# Patient Record
Sex: Female | Born: 1935
Health system: Southern US, Community
[De-identification: ages and names within clinical notes are randomized; demographics above are authoritative.]

## PROBLEM LIST (undated history)

## (undated) DIAGNOSIS — S92909A Unspecified fracture of unspecified foot, initial encounter for closed fracture: Secondary | ICD-10-CM

## (undated) DIAGNOSIS — N183 Chronic kidney disease, stage 3 unspecified: Secondary | ICD-10-CM

## (undated) DIAGNOSIS — C801 Malignant (primary) neoplasm, unspecified: Secondary | ICD-10-CM

## (undated) DIAGNOSIS — E079 Disorder of thyroid, unspecified: Secondary | ICD-10-CM

## (undated) DIAGNOSIS — T7840XA Allergy, unspecified, initial encounter: Secondary | ICD-10-CM

## (undated) DIAGNOSIS — E785 Hyperlipidemia, unspecified: Secondary | ICD-10-CM

## (undated) DIAGNOSIS — M1990 Unspecified osteoarthritis, unspecified site: Secondary | ICD-10-CM

## (undated) DIAGNOSIS — E669 Obesity, unspecified: Secondary | ICD-10-CM

## (undated) DIAGNOSIS — I10 Essential (primary) hypertension: Secondary | ICD-10-CM

## (undated) HISTORY — PX: TOTAL ABDOMINAL HYSTERECTOMY W/ BILATERAL SALPINGOOPHORECTOMY: SHX83

## (undated) HISTORY — DX: Obesity, unspecified: E66.9

## (undated) HISTORY — DX: Malignant (primary) neoplasm, unspecified: C80.1

## (undated) HISTORY — DX: Essential (primary) hypertension: I10

## (undated) HISTORY — DX: Disorder of thyroid, unspecified: E07.9

## (undated) HISTORY — DX: Chronic kidney disease, stage 3 (moderate): N18.3

## (undated) HISTORY — PX: CHOLECYSTECTOMY: SHX55

## (undated) HISTORY — DX: Hyperlipidemia, unspecified: E78.5

## (undated) HISTORY — PX: BREAST BIOPSY: SHX20

## (undated) HISTORY — DX: Allergy, unspecified, initial encounter: T78.40XA

## (undated) HISTORY — DX: Unspecified osteoarthritis, unspecified site: M19.90

## (undated) HISTORY — PX: CATARACT EXTRACTION: SUR2

## (undated) HISTORY — DX: Unspecified fracture of unspecified foot, initial encounter for closed fracture: S92.909A

## (undated) HISTORY — DX: Chronic kidney disease, stage 3 unspecified: N18.30

## (undated) HISTORY — PX: TOTAL KNEE ARTHROPLASTY: SHX125

---

## 1999-07-17 ENCOUNTER — Other Ambulatory Visit: Admission: RE | Admit: 1999-07-17 | Discharge: 1999-07-17 | Payer: Self-pay | Admitting: Family Medicine

## 2000-04-05 ENCOUNTER — Encounter: Payer: Self-pay | Admitting: Family Medicine

## 2000-04-05 ENCOUNTER — Encounter: Admission: RE | Admit: 2000-04-05 | Discharge: 2000-04-05 | Payer: Self-pay | Admitting: Family Medicine

## 2000-05-25 ENCOUNTER — Encounter: Payer: Self-pay | Admitting: Surgery

## 2000-05-31 ENCOUNTER — Encounter (INDEPENDENT_AMBULATORY_CARE_PROVIDER_SITE_OTHER): Payer: Self-pay | Admitting: Specialist

## 2000-06-01 ENCOUNTER — Inpatient Hospital Stay (HOSPITAL_COMMUNITY): Admission: EM | Admit: 2000-06-01 | Discharge: 2000-06-02 | Payer: Self-pay | Admitting: Surgery

## 2000-08-11 ENCOUNTER — Other Ambulatory Visit: Admission: RE | Admit: 2000-08-11 | Discharge: 2000-08-11 | Payer: Self-pay | Admitting: Family Medicine

## 2001-02-28 ENCOUNTER — Encounter: Payer: Self-pay | Admitting: Family Medicine

## 2001-02-28 ENCOUNTER — Encounter: Admission: RE | Admit: 2001-02-28 | Discharge: 2001-02-28 | Payer: Self-pay | Admitting: Family Medicine

## 2001-03-03 ENCOUNTER — Encounter: Payer: Self-pay | Admitting: Family Medicine

## 2001-03-03 ENCOUNTER — Encounter: Admission: RE | Admit: 2001-03-03 | Discharge: 2001-03-03 | Payer: Self-pay | Admitting: Family Medicine

## 2001-08-03 ENCOUNTER — Other Ambulatory Visit: Admission: RE | Admit: 2001-08-03 | Discharge: 2001-08-03 | Payer: Self-pay | Admitting: Family Medicine

## 2002-07-27 ENCOUNTER — Other Ambulatory Visit: Admission: RE | Admit: 2002-07-27 | Discharge: 2002-07-27 | Payer: Self-pay | Admitting: Family Medicine

## 2003-07-30 ENCOUNTER — Other Ambulatory Visit: Admission: RE | Admit: 2003-07-30 | Discharge: 2003-07-30 | Payer: Self-pay | Admitting: Family Medicine

## 2004-09-26 ENCOUNTER — Ambulatory Visit: Payer: Self-pay | Admitting: Family Medicine

## 2004-11-04 ENCOUNTER — Ambulatory Visit: Payer: Self-pay | Admitting: Family Medicine

## 2004-11-04 ENCOUNTER — Other Ambulatory Visit: Admission: RE | Admit: 2004-11-04 | Discharge: 2004-11-04 | Payer: Self-pay | Admitting: Family Medicine

## 2005-02-20 ENCOUNTER — Ambulatory Visit: Payer: Self-pay | Admitting: Family Medicine

## 2005-03-05 ENCOUNTER — Ambulatory Visit: Payer: Self-pay | Admitting: Family Medicine

## 2005-05-13 ENCOUNTER — Ambulatory Visit: Payer: Self-pay | Admitting: Family Medicine

## 2005-07-06 ENCOUNTER — Encounter: Payer: Self-pay | Admitting: Family Medicine

## 2005-07-06 LAB — CONVERTED CEMR LAB

## 2005-11-18 ENCOUNTER — Ambulatory Visit: Payer: Self-pay | Admitting: Family Medicine

## 2005-11-24 ENCOUNTER — Encounter: Payer: Self-pay | Admitting: Family Medicine

## 2005-11-24 ENCOUNTER — Other Ambulatory Visit: Admission: RE | Admit: 2005-11-24 | Discharge: 2005-11-24 | Payer: Self-pay | Admitting: Family Medicine

## 2005-11-24 ENCOUNTER — Ambulatory Visit: Payer: Self-pay | Admitting: Family Medicine

## 2005-12-10 ENCOUNTER — Ambulatory Visit: Payer: Self-pay | Admitting: Family Medicine

## 2006-01-25 ENCOUNTER — Encounter: Admission: RE | Admit: 2006-01-25 | Discharge: 2006-01-25 | Payer: Self-pay | Admitting: Nephrology

## 2006-01-27 ENCOUNTER — Emergency Department (HOSPITAL_COMMUNITY): Admission: EM | Admit: 2006-01-27 | Discharge: 2006-01-27 | Payer: Self-pay | Admitting: Emergency Medicine

## 2006-03-01 ENCOUNTER — Ambulatory Visit: Payer: Self-pay

## 2006-08-19 ENCOUNTER — Ambulatory Visit: Payer: Self-pay | Admitting: Family Medicine

## 2006-11-25 ENCOUNTER — Ambulatory Visit: Payer: Self-pay | Admitting: Family Medicine

## 2006-11-25 LAB — CONVERTED CEMR LAB
AST: 23 units/L (ref 0–37)
Alkaline Phosphatase: 70 units/L (ref 39–117)
BUN: 40 mg/dL — ABNORMAL HIGH (ref 6–23)
Basophils Relative: 0.1 % (ref 0.0–1.0)
CO2: 28 meq/L (ref 19–32)
Calcium: 9.9 mg/dL (ref 8.4–10.5)
Chloride: 108 meq/L (ref 96–112)
Cholesterol: 214 mg/dL (ref 0–200)
Creatinine, Ser: 1.6 mg/dL — ABNORMAL HIGH (ref 0.4–1.2)
Creatinine,U: 145.8 mg/dL
Eosinophils Relative: 4.6 % (ref 0.0–5.0)
HCT: 39.2 % (ref 36.0–46.0)
HDL: 59.8 mg/dL (ref >39.0)
Hemoglobin: 13.4 g/dL (ref 12.0–15.0)
Lymphocytes Relative: 41.4 % (ref 12.0–46.0)
MCHC: 34.3 g/dL (ref 30.0–36.0)
Microalb, Ur: 0.7 mg/dL (ref 0.0–1.9)
Monocytes Absolute: 0.7 10*3/uL (ref 0.2–0.7)
Monocytes Relative: 10.4 % (ref 3.0–11.0)
Platelets: 246 10*3/uL (ref 150–400)
RBC: 4.21 M/uL (ref 3.87–5.11)
Sodium: 143 meq/L (ref 135–145)
Total CHOL/HDL Ratio: 3.6
Triglycerides: 264 mg/dL (ref 0–149)
VLDL: 53 mg/dL — ABNORMAL HIGH (ref 0–40)
WBC: 7.1 10*3/uL (ref 4.5–10.5)

## 2006-12-02 ENCOUNTER — Encounter: Payer: Self-pay | Admitting: Family Medicine

## 2006-12-02 ENCOUNTER — Ambulatory Visit: Payer: Self-pay | Admitting: Family Medicine

## 2006-12-02 ENCOUNTER — Other Ambulatory Visit: Admission: RE | Admit: 2006-12-02 | Discharge: 2006-12-02 | Payer: Self-pay | Admitting: Family Medicine

## 2006-12-02 DIAGNOSIS — I10 Essential (primary) hypertension: Secondary | ICD-10-CM

## 2006-12-02 DIAGNOSIS — J309 Allergic rhinitis, unspecified: Secondary | ICD-10-CM | POA: Insufficient documentation

## 2006-12-02 DIAGNOSIS — M1990 Unspecified osteoarthritis, unspecified site: Secondary | ICD-10-CM | POA: Insufficient documentation

## 2007-01-24 ENCOUNTER — Ambulatory Visit: Payer: Self-pay | Admitting: Family Medicine

## 2007-01-24 ENCOUNTER — Encounter: Payer: Self-pay | Admitting: Internal Medicine

## 2007-06-10 ENCOUNTER — Ambulatory Visit: Payer: Self-pay | Admitting: Family Medicine

## 2007-06-14 ENCOUNTER — Ambulatory Visit: Payer: Self-pay | Admitting: Family Medicine

## 2007-06-17 DIAGNOSIS — I781 Nevus, non-neoplastic: Secondary | ICD-10-CM

## 2007-07-20 ENCOUNTER — Inpatient Hospital Stay (HOSPITAL_COMMUNITY): Admission: RE | Admit: 2007-07-20 | Discharge: 2007-07-24 | Payer: Self-pay | Admitting: Orthopedic Surgery

## 2008-01-18 ENCOUNTER — Encounter: Payer: Self-pay | Admitting: Family Medicine

## 2008-01-19 ENCOUNTER — Other Ambulatory Visit: Admission: RE | Admit: 2008-01-19 | Discharge: 2008-01-19 | Payer: Self-pay | Admitting: Family Medicine

## 2008-01-19 ENCOUNTER — Ambulatory Visit: Payer: Self-pay | Admitting: Family Medicine

## 2008-01-19 ENCOUNTER — Encounter: Payer: Self-pay | Admitting: Family Medicine

## 2008-01-19 DIAGNOSIS — E785 Hyperlipidemia, unspecified: Secondary | ICD-10-CM | POA: Insufficient documentation

## 2008-01-19 DIAGNOSIS — M109 Gout, unspecified: Secondary | ICD-10-CM

## 2008-01-23 LAB — CONVERTED CEMR LAB
ALT: 14 units/L (ref 0–35)
Albumin: 4.6 g/dL (ref 3.5–5.2)
Alkaline Phosphatase: 80 units/L (ref 39–117)
BUN: 64 mg/dL — ABNORMAL HIGH (ref 6–23)
Basophils Relative: 0.2 % (ref 0.0–3.0)
Bilirubin, Direct: 0.1 mg/dL (ref 0.0–0.3)
Chloride: 102 meq/L (ref 96–112)
Creatinine, Ser: 1.8 mg/dL — ABNORMAL HIGH (ref 0.4–1.2)
GFR calc Af Amer: 36 mL/min
GFR calc non Af Amer: 29 mL/min
Glucose, Bld: 124 mg/dL — ABNORMAL HIGH (ref 70–99)
HDL: 53.3 mg/dL (ref >39.0)
MCV: 92.7 fL (ref 78.0–100.0)
Monocytes Absolute: 0.4 10*3/uL (ref 0.1–1.0)
Neutro Abs: 6.1 10*3/uL (ref 1.4–7.7)
Neutrophils Relative %: 73.6 % (ref 43.0–77.0)
Platelets: 243 10*3/uL (ref 150–400)
Potassium: 4.1 meq/L (ref 3.5–5.1)
Sodium: 137 meq/L (ref 135–145)
Total Bilirubin: 0.7 mg/dL (ref 0.3–1.2)
Total CHOL/HDL Ratio: 3.1
Total Protein: 7.3 g/dL (ref 6.0–8.3)
VLDL: 33 mg/dL (ref 0–40)

## 2008-12-26 ENCOUNTER — Telehealth: Payer: Self-pay | Admitting: Family Medicine

## 2009-02-08 ENCOUNTER — Ambulatory Visit: Payer: Self-pay | Admitting: Family Medicine

## 2009-02-08 DIAGNOSIS — E039 Hypothyroidism, unspecified: Secondary | ICD-10-CM

## 2009-02-08 LAB — CONVERTED CEMR LAB
Blood in Urine, dipstick: NEGATIVE
Glucose, Urine, Semiquant: NEGATIVE
Specific Gravity, Urine: 1.02
Urobilinogen, UA: 0.2
WBC Urine, dipstick: NEGATIVE

## 2009-02-14 LAB — CONVERTED CEMR LAB
Alkaline Phosphatase: 85 units/L (ref 39–117)
Basophils Absolute: 0 10*3/uL (ref 0.0–0.1)
Bilirubin, Direct: 0 mg/dL (ref 0.0–0.3)
CO2: 25 meq/L (ref 19–32)
Chloride: 110 meq/L (ref 96–112)
Cholesterol: 186 mg/dL (ref 0–200)
Eosinophils Absolute: 0.2 10*3/uL (ref 0.0–0.7)
Eosinophils Relative: 2.5 % (ref 0.0–5.0)
Hemoglobin: 13.3 g/dL (ref 12.0–15.0)
Lymphocytes Relative: 21.3 % (ref 12.0–46.0)
Lymphs Abs: 1.6 10*3/uL (ref 0.7–4.0)
Monocytes Absolute: 0.5 10*3/uL (ref 0.1–1.0)
Monocytes Relative: 6.5 % (ref 3.0–12.0)
Neutro Abs: 5.4 10*3/uL (ref 1.4–7.7)
Neutrophils Relative %: 69.6 % (ref 43.0–77.0)
RBC: 4.19 M/uL (ref 3.87–5.11)
TSH: 2.43 microintl units/mL (ref 0.35–5.50)
WBC: 7.7 10*3/uL (ref 4.5–10.5)

## 2010-01-01 ENCOUNTER — Encounter: Admission: RE | Admit: 2010-01-01 | Discharge: 2010-01-01 | Payer: Self-pay | Admitting: Family Medicine

## 2010-01-09 ENCOUNTER — Encounter: Payer: Self-pay | Admitting: Family Medicine

## 2010-01-22 ENCOUNTER — Encounter: Admission: RE | Admit: 2010-01-22 | Discharge: 2010-01-22 | Payer: Self-pay | Admitting: Family Medicine

## 2010-01-22 LAB — HM MAMMOGRAPHY

## 2010-02-03 ENCOUNTER — Ambulatory Visit: Payer: Self-pay | Admitting: Family Medicine

## 2010-02-03 LAB — CONVERTED CEMR LAB
ALT: 19 units/L (ref 0–35)
Alkaline Phosphatase: 75 units/L (ref 39–117)
Bilirubin Urine: NEGATIVE
Blood in Urine, dipstick: NEGATIVE
Chloride: 105 meq/L (ref 96–112)
Cholesterol: 183 mg/dL (ref 0–200)
Direct LDL: 91.4 mg/dL
Eosinophils Relative: 7.5 % — ABNORMAL HIGH (ref 0.0–5.0)
GFR calc non Af Amer: 38.09 mL/min (ref >60)
Glucose, Bld: 115 mg/dL — ABNORMAL HIGH (ref 70–99)
HCT: 37.1 % (ref 36.0–46.0)
HDL: 53.3 mg/dL (ref >39.00)
Hemoglobin: 12.7 g/dL (ref 12.0–15.0)
Lymphocytes Relative: 33.2 % (ref 12.0–46.0)
Lymphs Abs: 2.6 10*3/uL (ref 0.7–4.0)
Monocytes Relative: 10.6 % (ref 3.0–12.0)
Neutrophils Relative %: 48.3 % (ref 43.0–77.0)
Nitrite: NEGATIVE
Protein, U semiquant: NEGATIVE
RBC: 3.99 M/uL (ref 3.87–5.11)
Total Bilirubin: 0.6 mg/dL (ref 0.3–1.2)
Total Protein: 6.8 g/dL (ref 6.0–8.3)
Urobilinogen, UA: 0.2
VLDL: 54.6 mg/dL — ABNORMAL HIGH (ref 0.0–40.0)
WBC: 7.9 10*3/uL (ref 4.5–10.5)

## 2010-02-10 ENCOUNTER — Ambulatory Visit: Payer: Self-pay | Admitting: Family Medicine

## 2010-02-10 DIAGNOSIS — R059 Cough, unspecified: Secondary | ICD-10-CM | POA: Insufficient documentation

## 2010-02-10 DIAGNOSIS — R05 Cough: Secondary | ICD-10-CM

## 2010-02-27 ENCOUNTER — Ambulatory Visit: Payer: Self-pay | Admitting: Family Medicine

## 2010-04-08 ENCOUNTER — Ambulatory Visit: Payer: Self-pay | Admitting: Family Medicine

## 2010-08-05 NOTE — Assessment & Plan Note (Signed)
Summary: 1 month fup//ccm   Vital Signs:  Patient profile:   77 year old female Menstrual status:  hysterectomy Weight:      200 pounds Temp:     97.8 degrees F oral BP sitting:   118 / 78  (left arm) Cuff size:   regular  Vitals Entered By: Westley Hummer CMA [name]) (February 27, 2010 11:35 AM) CC: cpx Is Patient Diabetic? No Flu Vaccine Consent Questions     Do you have a history of severe allergic reactions to this vaccine? no    Any prior history of allergic reactions to egg and/or gelatin? no    Do you have a sensitivity to the preservative Thimersol? no    Do you have a past history of Guillan-Barre Syndrome? no    Do you currently have an acute febrile illness? no    Have you ever had a severe reaction to latex? no    Vaccine information given and explained to patient? yes    Are you currently pregnant? no    Lot Number:[id number]   Exp Date:01/03/2011   Site Given  Left Deltoid IM   CC:  cpx.  History of Present Illness: [name] is a 77 year old female, who comes in today for reevaluation of asthma.  As stated  in her previous note 3 weeks ago.  She presented with a 6 month plus history of coughing.  She said a history of underlying allergic rhinitis.  On physical exam she was noted to be wheezing and was started on Qvar 40   1  puff b.i.d.  She comes back today stating she is about 80% improved.  No side effects from the medication  Allergies: 1)  ! * Shellfish  Past History:  Past medical, surgical, family and social histories (including risk factors) reviewed for relevance to current acute and chronic problems.  Past Medical History: Reviewed history from 01/19/2008 and no changes required. Allergic rhinitis Hypertension Osteoarthritis ovarian cancer surgery 1989 TAH -- BSO.  No recurrence melanoma childbirth x 3 gallbladder removed torn cartilage right knee surgery bilateral cataracts obesity right total knee replacement of 9 Hyperlipidemia  Past  Surgical History: Reviewed history from 12/02/2006 and no changes required. ovarian cancer-TAH/BSO -RADIATION-89 MELANOMA T&A GB R KNEE (CART) BILAT. CATS  Family History: Reviewed history from 06/10/2007 and no changes required. Family History Diabetes 1st degree relative Family History Hypertension glaucoma obesity  Social History: Reviewed history from 06/10/2007 and no changes required. Married Never Smoked Alcohol use-no Drug use-no Regular exercise-no  Review of Systems      See HPI  Physical Exam  General:  Well-developed,well-nourished,in no acute distress; alert,appropriate and cooperative throughout examination Lungs:  symmetrical breath sounds are very late faint expiratory wheezing   Impression & Recommendations:  Problem # 1:  COUGH (ICD-786.2) Assessment Improved  Orders: Prescription Created Electronically [id number])  Complete Medication List: 1)  Allopurinol 300 Mg Tabs (Allopurinol) .Marland Kitchen.. 1 tab once daily 2)  Lasix 40 Mg Tabs (Furosemide) .Marland Kitchen.. 1po   q am 3)  Levothyroxine Sodium 50 Mcg Tabs (Levothyroxine sodium) .Marland Kitchen.. 1 tab then 1/2 the next day alternating 4)  Potassium Chloride Crys Cr 20 Meq Tbcr (Potassium chloride crys cr) .... Take 1 tablet by mouth every evening 5)  Premarin 0.3 Mg Tabs (Estrogens conjugated) .... Once on   mon & fri 6)  Zocor 20 Mg Tabs (Simvastatin) .... Take 1 tab by mouth at bedtime 7)  Zyrtec Allergy 10 Mg Tabs (Cetirizine hcl) .... Take 1 tablet  by mouth once a day 8)  Tylenol 8 Hour 650 Mg Cr-tabs (Acetaminophen) .Marland Kitchen.. 1 tab once daily 9)  Tums 500 Mg Chew (Calcium carbonate antacid) .Marland Kitchen.. 1 tab by mouth once daily 10)  Exforge 10-160 Mg Tabs (Amlodipine besylate-valsartan) .... Take one tab by mouth once daily 11)  Aspirin 81 Mg Tabs (Aspirin) .... Take one tab by mouth once daily 12)  Qvar 40 Mcg/act Aers (Beclomethasone dipropionate) .... 2  puff two times a day  Other Orders: Admin 1st Vaccine [id number]) Flu Vaccine  75yrs + [id number])  Patient Instructions: 1)  increased E. Qvar to 2 ps twice daily.  Return in one month for follow-up Prescriptions: QVAR 40 MCG/ACT AERS (BECLOMETHASONE DIPROPIONATE) 2  puff two times a day  #1 x 2   Entered and Authorized by:   Dorena Cookey MD   Signed by:   Dorena Cookey MD on 02/27/2010   Method used:   Electronically to        [facility]. [phone]* (retail)       [address]       [place], [place]  [address]       Ph: [phone]       Fax: [phone]   RxID:   [id number]

## 2010-08-05 NOTE — Assessment & Plan Note (Signed)
Summary: F/U ON MEDS // RS   Vital Signs:  Patient profile:   77 year old female Menstrual status:  hysterectomy Weight:      198 pounds Temp:     98.3 degrees F oral BP sitting:   124 / 80  (left arm) Cuff size:   regular  Vitals Entered By: Westley Hummer CMA [name]) (April 08, 2010 11:29 AM) CC: follow-up visit   CC:  follow-up visit.  History of Present Illness: [name] is a 77 year old female, married, nonsmoker, who comes in today for evaluation of asthma.  She had a severe bout of asthma in August and is on Qvar 42 puffs b.i.d. she feels almost back to normal except for a little bit of wheezing.  She's had a history of allergic rhinitis in the past.  We discussed for his options.  Because of the severe nature of her wheezing we elected to take two puffs b.i.d. all year round.  Going forward  Allergies: 1)  ! * Shellfish  Past History:  Past medical, surgical, family and social histories (including risk factors) reviewed for relevance to current acute and chronic problems.  Past Medical History: Reviewed history from 01/19/2008 and no changes required. Allergic rhinitis Hypertension Osteoarthritis ovarian cancer surgery 1989 TAH -- BSO.  No recurrence melanoma childbirth x 3 gallbladder removed torn cartilage right knee surgery bilateral cataracts obesity right total knee replacement of 9 Hyperlipidemia  Past Surgical History: Reviewed history from 12/02/2006 and no changes required. ovarian cancer-TAH/BSO -RADIATION-89 MELANOMA T&A GB R KNEE (CART) BILAT. CATS  Family History: Reviewed history from 06/10/2007 and no changes required. Family History Diabetes 1st degree relative Family History Hypertension glaucoma obesity  Social History: Reviewed history from 06/10/2007 and no changes required. Married Never Smoked Alcohol use-no Drug use-no Regular exercise-no  Review of Systems      See HPI  Physical Exam  General:   Well-developed,well-nourished,in no acute distress; alert,appropriate and cooperative throughout examination Lungs:  symmetrical breath sounds, very late expiratory wheezing   Impression & Recommendations:  Problem # 1:  COUGH (ICD-786.2) Assessment Improved  Complete Medication List: 1)  Allopurinol 300 Mg Tabs (Allopurinol) .Marland Kitchen.. 1 tab once daily 2)  Lasix 40 Mg Tabs (Furosemide) .Marland Kitchen.. 1po   q am 3)  Levothyroxine Sodium 50 Mcg Tabs (Levothyroxine sodium) .Marland Kitchen.. 1 tab then 1/2 the next day alternating 4)  Potassium Chloride Crys Cr 20 Meq Tbcr (Potassium chloride crys cr) .... Take 1 tablet by mouth every evening 5)  Premarin 0.3 Mg Tabs (Estrogens conjugated) .... Once on   mon & fri 6)  Zocor 20 Mg Tabs (Simvastatin) .... Take 1 tab by mouth at bedtime 7)  Zyrtec Allergy 10 Mg Tabs (Cetirizine hcl) .... Take 1 tablet by mouth once a day 8)  Tylenol 8 Hour 650 Mg Cr-tabs (Acetaminophen) .Marland Kitchen.. 1 tab once daily 9)  Tums 500 Mg Chew (Calcium carbonate antacid) .Marland Kitchen.. 1 tab by mouth once daily 10)  Exforge 10-160 Mg Tabs (Amlodipine besylate-valsartan) .... Take one tab by mouth once daily 11)  Aspirin 81 Mg Tabs (Aspirin) .... Take one tab by mouth once daily 12)  Qvar 40 Mcg/act Aers (Beclomethasone dipropionate) .... 2  puff two times a day  Patient Instructions: 1)  continue the Qvar 40 does two puffs b.i.d. all year round 2)  Please schedule a follow-up appointment as needed. Prescriptions: QVAR 40 MCG/ACT AERS (BECLOMETHASONE DIPROPIONATE) 2  puff two times a day  #2 x 6   Entered and  Authorized by:   Dorena Cookey MD   Signed by:   Dorena Cookey MD on 04/08/2010   Method used:   Electronically to        [facility]. [phone]* (retail)       [address]       [place], [place]  [address]       Ph: [phone]       Fax: [phone]   RxID:   [id number]

## 2010-08-05 NOTE — Assessment & Plan Note (Signed)
Summary: CPX/CJR   Vital Signs:  Patient profile:   77 year old female Menstrual status:  hysterectomy Height:      62.75 inches Weight:      198 pounds BMI:     35.48 Temp:     98.9 degrees F oral BP sitting:   120 / 80  (left arm) Cuff size:   regular  Vitals Entered By: Westley Hummer CMA [name]) (February 10, 2010 3:41 PM) CC: cpx Is Patient Diabetic? No   CC:  cpx.  History of Present Illness: [name] is a 77 year old, married female, nonsmoker, who comes in today for physical examination  She takes exforge 160 mg daily for hypertension, along with a potassium supplement 20 mEq daily, and 40 mg of Lasix daily.  BP 120/80.  She takes Premarin .3 twice weekly for severe vaginal dryness and hot flashes.  She takes allopurinol 3 her milligrams daily to prevent count and levo thyroxine 50 mg alternating with 25 to control hypothyroidism.  Zocor 20 nightly for hyperlipidemia.  Zyrtec 10 nightly for allergic rhinitis.  She gets routine eye care.  Dental care.  Colonoscopy over 10 years ago.  Encouraged screening colonoscopy.  She does not check her breasts monthly, although she does mammogram last year.  Tetanus 2007 Pneumovax 2009 shingles 2009 seasonal flu 2010.  Her only new problem is she had a cough for the past 6 weeks.  No fever no sputum production et Ronney Asters.  She does have a history of allergic rhinitis. Here for Medicare AWV:  1.   Risk factors based on Past M, S, F history:...reviewed no change except for history of a cough x 6 months 2.   Physical Activities:does not walk at all 3.   Depression/mood: .. mood good.  No depression 4.   Hearing: normal 5.   ADL's: reviewed.  No change 6.   Fall Risk: reviewed.  No change 7.   Home Safety: reviewed.  No change 8.   Height, weight, &visual acuity:height weight, normal annual eye exam by ophthalmologist 9.   Counseling: she was counseled to begin losing weight.  She is 198 pounds and start a daily walking program. 10.   Labs  ordered based on risk factors:...labs done at a time, all normal except for fasting blood sugar 115  11.           Referral Coordination........none indicated 12.           Care Plan........Marland Kitchenreviewed in detail 13.            Cognitive Assessment .........normal mentation  Allergies: 1)  ! * Shellfish  Past History:  Past medical, surgical, family and social histories (including risk factors) reviewed, and no changes noted (except as noted below).  Past Medical History: Reviewed history from 01/19/2008 and no changes required. Allergic rhinitis Hypertension Osteoarthritis ovarian cancer surgery 1989 TAH -- BSO.  No recurrence melanoma childbirth x 3 gallbladder removed torn cartilage right knee surgery bilateral cataracts obesity right total knee replacement of 9 Hyperlipidemia  Past Surgical History: Reviewed history from 12/02/2006 and no changes required. ovarian cancer-TAH/BSO -RADIATION-89 MELANOMA T&A GB R KNEE (CART) BILAT. CATS  Family History: Reviewed history from 06/10/2007 and no changes required. Family History Diabetes 1st degree relative Family History Hypertension glaucoma obesity  Social History: Reviewed history from 06/10/2007 and no changes required. Married Never Smoked Alcohol use-no Drug use-no Regular exercise-no  Review of Systems      See HPI  Physical Exam  General:  Well-developed,well-nourished,in no acute distress; alert,appropriate and cooperative throughout examination Head:  Normocephalic and atraumatic without obvious abnormalities. No apparent alopecia or balding. Eyes:  No corneal or conjunctival inflammation noted. EOMI. Perrla. Funduscopic exam benign, without hemorrhages, exudates or papilledema. Vision grossly normal. Ears:  External ear exam shows no significant lesions or deformities.  Otoscopic examination reveals clear canals, tympanic membranes are intact bilaterally without bulging, retraction, inflammation or  discharge. Hearing is grossly normal bilaterally. Nose:  External nasal examination shows no deformity or inflammation. Nasal mucosa are pink and moist without lesions or exudates. Mouth:  Oral mucosa and oropharynx without lesions or exudates.  Teeth in good repair. Neck:  No deformities, masses, or tenderness noted. Chest Wall:  No deformities, masses, or tenderness noted. Breasts:  No mass, nodules, thickening, tenderness, bulging, retraction, inflamation, nipple discharge or skin changes noted.   Lungs:  Normal respiratory effort, chest expands symmetrically. Lungs are clear to auscultation, no crackles or wheezes. Heart:  Normal rate and regular rhythm. S1 and S2 normal without gallop, murmur, click, rub or other extra sounds. Abdomen:  Bowel sounds positive,abdomen soft and non-tender without masses, organomegaly or hernias noted. Rectal:  No external abnormalities noted. Normal sphincter tone. No rectal masses or tenderness. Genitalia:  Pelvic Exam:        External: normal female genitalia without lesions or masses        Vagina: normal without lesions or masses        Cervix: normal without lesions or masses        Adnexa: normal bimanual exam without masses or fullness        Uterus: normal by palpation        Pap smear: not performed Msk:  No deformity or scoliosis noted of thoracic or lumbar spine.   Pulses:  R and L carotid,radial,femoral,dorsalis pedis and posterior tibial pulses are full and equal bilaterally Extremities:  No clubbing, cyanosis, edema, or deformity noted with normal full range of motion of all joints.   Neurologic:  No cranial nerve deficits noted. Station and gait are normal. Plantar reflexes are down-going bilaterally. DTRs are symmetrical throughout. Sensory, motor and coordinative functions appear intact.   Impression & Recommendations:  Problem # 1:  UNSPECIFIED HYPOTHYROIDISM (ICD-244.9) Assessment Improved  Her updated medication list for this problem  includes:    Levothyroxine Sodium 50 Mcg Tabs (Levothyroxine sodium) .Marland Kitchen... 1 tab then 1/2 the next day alternating  Orders: Prescription Created Electronically [phone]) Medicare -1st Annual Wellness Visit [phone])  Problem # 2:  GOUTY ARTHROPATHY (ICD-274.0) Assessment: Improved  Her updated medication list for this problem includes:    Allopurinol 300 Mg Tabs (Allopurinol) .Marland Kitchen... 1 tab once daily  Problem # 3:  HYPERLIPIDEMIA (ICD-272.4) Assessment: Improved  Her updated medication list for this problem includes:    Zocor 20 Mg Tabs (Simvastatin) .Marland Kitchen... Take 1 tab by mouth at bedtime  Orders: EKG w/ Interpretation (93[address]) Prescription Created Electronically [phone]) Medicare -1st Annual Wellness Visit [phone])  Problem # 4:  OSTEOARTHRITIS (ICD-715.90) Assessment: Unchanged  Her updated medication list for this problem includes:    Tylenol 8 Hour 650 Mg Cr-tabs (Acetaminophen) .Marland Kitchen... 1 tab once daily    Aspirin 81 Mg Tabs (Aspirin) .Marland Kitchen... Take one tab by mouth once daily  Orders: Prescription Created Electronically [phone]) Medicare -1st Annual Wellness Visit [phone])  Problem # 5:  HYPERTENSION (ICD-401.9) Assessment: Improved  The following medications were removed from the medication list:    Cardizem La 180 Mg Tb24 (Diltiazem hcl  coated beads) .Marland Kitchen... Take 1 tablet by mouth every morning    Exforge 5-320 Mg Tabs (Amlodipine besylate-valsartan) .Marland Kitchen... Take 1 tablet by mouth once a day Her updated medication list for this problem includes:    Lasix 40 Mg Tabs (Furosemide) .Marland Kitchen... 1po   q am    Exforge 10-160 Mg Tabs (Amlodipine besylate-valsartan) .Marland Kitchen... Take one tab by mouth once daily  Orders: EKG w/ Interpretation (93000) Prescription Created Electronically [phone]) Medicare -1st Annual Wellness Visit [phone])  Problem # 6:  ALLERGIC RHINITIS (ICD-477.9) Assessment: Improved  Her updated medication list for this problem includes:    Zyrtec Allergy 10 Mg Tabs (Cetirizine hcl)  .Marland Kitchen... Take 1 tablet by mouth once a day  Orders: Prescription Created Electronically [phone]) Medicare -1st Annual Wellness Visit [phone])  Problem # 7:  COUGH [phone]) Assessment: New  Orders: Prescription Created Electronically [phone]) Medicare -1st Annual Wellness Visit [phone])  Complete Medication List: 1)  Allopurinol 300 Mg Tabs (Allopurinol) .Marland Kitchen.. 1 tab once daily 2)  Lasix 40 Mg Tabs (Furosemide) .Marland Kitchen.. 1po   q am 3)  Levothyroxine Sodium 50 Mcg Tabs (Levothyroxine sodium) .Marland Kitchen.. 1 tab then 1/2 the next day alternating 4)  Potassium Chloride Crys Cr 20 Meq Tbcr (Potassium chloride crys cr) .... Take 1 tablet by mouth every evening 5)  Premarin 0.3 Mg Tabs (Estrogens conjugated) .... Once on   mon & fri 6)  Zocor 20 Mg Tabs (Simvastatin) .... Take 1 tab by mouth at bedtime 7)  Zyrtec Allergy 10 Mg Tabs (Cetirizine hcl) .... Take 1 tablet by mouth once a day 8)  Tylenol 8 Hour 650 Mg Cr-tabs (Acetaminophen) .Marland Kitchen.. 1 tab once daily 9)  Tums 500 Mg Chew (Calcium carbonate antacid) .Marland Kitchen.. 1 tab by mouth once daily 10)  Exforge 10-160 Mg Tabs (Amlodipine besylate-valsartan) .... Take one tab by mouth once daily 11)  Aspirin 81 Mg Tabs (Aspirin) .... Take one tab by mouth once daily 12)  Qvar 40 Mcg/act Aers (Beclomethasone dipropionate) .Marland Kitchen.. 1 puff two times a day  Patient Instructions: 1)  Please schedule a follow-up appointment in 1 year. 2)  It is important that you exercise regularly at least 20 minutes 5 times a week. If you develop chest pain, have severe difficulty breathing, or feel very tired , stop exercising immediately and seek medical attention. 3)  Schedule your mammogram. 4)  Schedule a colonoscopy/sigmoidoscopy to help detect colon cancer. 5)  Take calcium +Vitamin D daily. 6)  Take an Aspirin every day. 7)  begin Qvar one puff b.i.d. return in 4 weeks for follow-up Prescriptions: EXFORGE 10-160 MG TABS (AMLODIPINE BESYLATE-VALSARTAN) take one tab by mouth once daily   #100 x 3   Entered and Authorized by:   Dorena Cookey MD   Signed by:   Dorena Cookey MD on 02/10/2010   Method used:   Electronically to        [facility]. [phone]* (retail)       [address]       [place], [place]  [address]       Ph: [phone]       Fax: [phone]   [id number] ZOCOR 20 MG TABS (SIMVASTATIN) Take 1 tab by mouth at bedtime  #100 x 3   Entered and Authorized by:   Dorena Cookey MD   Signed by:   Dorena Cookey MD on 02/10/2010   Method used:  Electronically to        [facility]. [phone]* (retail)       [address].       [place], [place]  [address]       Ph: [phone]       Fax: [phone]   RxID:   [id number] PREMARIN 0.3 MG TABS (ESTROGENS CONJUGATED) ONCE ON   MON & FRI  #50 x 1   Entered and Authorized by:   Dorena Cookey MD   Signed by:   Dorena Cookey MD on 02/10/2010   Method used:   Electronically to        [facility]. [phone]* (retail)       [address].       [place], [place]  [address]       Ph: [phone]       Fax: [phone]   RxID:   [id number] POTASSIUM CHLORIDE CRYS CR 20 MEQ TBCR (POTASSIUM CHLORIDE CRYS CR) Take 1 tablet by mouth every evening  #100 x 3   Entered and Authorized by:   Dorena Cookey MD   Signed by:   Dorena Cookey MD on 02/10/2010   Method used:   Electronically to        [facility]. [phone]* (retail)       [address].       [place], [place]  [address]       Ph: [phone]       Fax: [phone]   RxID:   [id number] LEVOTHYROXINE SODIUM 50 MCG TABS (LEVOTHYROXINE SODIUM) 1 tab then 1/2 the next day alternating  #100 x 3   Entered and Authorized by:   Dorena Cookey MD   Signed by:   Dorena Cookey MD on 02/10/2010   Method used:   Electronically to        [facility]. [phone]* (retail)       [address].       [place], [place]  [address]       Ph: [phone]       Fax: [phone]   RxID:   [id number] LASIX 40 MG TABS (FUROSEMIDE) 1po   q am  #100 x 3   Entered and Authorized by:   Dorena Cookey MD   Signed by:   Dorena Cookey MD on 02/10/2010   Method used:   Electronically to        [facility]. [phone]* (retail)       [address].       [place], [place]  [address]       Ph: [phone]       Fax: [phone]   [id number] ALLOPURINOL 300 MG TABS (ALLOPURINOL) 1 tab once daily  #100 x 3   Entered and Authorized by:   Dorena Cookey MD   Signed by:   Dorena Cookey MD on 02/10/2010   Method used:   Electronically to        [facility]. [phone]* (retail)       [place].       [facility] [place]  [place], [place]  [address]       Ph: [phone]       Fax: [phone]   [id number] QVAR 40 MCG/ACT AERS (BECLOMETHASONE DIPROPIONATE) 1 puff two times a day  #1 x 3   Entered and Authorized by:   Dorena Cookey MD   Signed by:   Dorena Cookey MD on 02/10/2010   Method used:   Electronically to        [facility]. [phone]* (retail)       [address].       [place], [place]  [address]       Ph: [phone]       Fax: [phone]   RxID:   [id number]    Immunization History:  Influenza Immunization History:    Influenza:  historical (04/05/2009)

## 2010-11-18 NOTE — H&P (Signed)
[name], [name]              ACCOUNT NO.:  [id number]   MEDICAL RECORD NO.:  [id number]          PATIENT TYPE:  INP   LOCATION:  NA                           FACILITY:  [facility]   PHYSICIAN:  Gaynelle Arabian, M.D.    DATE OF BIRTH:  Oct 23, 1933   DATE OF ADMISSION:  07/20/2007  DATE OF DISCHARGE:                              HISTORY & PHYSICAL   DATE OF OFFICE VISIT HISTORY AND PHYSICAL:  June 30, 2007.   CHIEF COMPLAINT:  Right knee pain.   HISTORY OF PRESENT ILLNESS:  The patient is a 77 year old female who has  been seen by Dr. Wynelle Link for ongoing knee pain and has been  progressively getting worse over time.  She has known end-stage  arthritis and her x-rays show bone-on-bone.  It is felt she has reached  the point where she would benefit from undergoing knee replacement.  Risks and benefits were discussed and the patient is subsequently  admitted to the hospital.   ALLERGIES:  NO KNOWN DRUG ALLERGIES.   FOOD ALLERGIES:  She does have a anaphylactic reaction with SHELLFISH.   CURRENT MEDICATIONS:  Premarin, allopurinol, furosemide, potassium,  Cardizem LA, levothyroxine, simvastatin, Exforge, Zyrtec, Tylenol and  Tums.   PAST MEDICAL HISTORY:  1. History of asthma.  2. History of bronchitis.  3. History of pneumonia.  4. Cataracts.  5. Hypertension.  6. Cardiac murmur.  7. Hypercholesterolemia.  8. Kidney disease.  9. Hypothyroidism.  10.History of melanoma.  11.History of ovarian cancer.  12.Degenerative disk disease.  13.Postmenopausal.  14.Childhood diseases including measles and mumps.   SURGICAL HISTORY:  1. Tonsils.  2. Hysterectomy.  3. Melanoma excision.  4. Gallbladder.   FAMILY HISTORY:  Father deceased at age 32 with stroke and diabetes.  Mother deceased at age 12, MI x3.  Brother deceased at age 33, heart  attack.   SOCIAL HISTORY:  Married, nonsmoker, rare intake of alcohol, 3 children.  Husband will be assisting with care after  surgery.   REVIEW OF SYSTEMS:  GENERAL:  No fevers or chills.  Occasional night  sweats.  NEUROLOGIC:  No seizures, syncope or paralysis.  RESPIRATORY:  No shortness breath, productive cough or hemoptysis.  CARDIOVASCULAR:  No chest pain, angina or orthopnea.  GI: Occasional diarrhea.  No nausea  or vomiting.  No constipation.  GU:  A little bit of weak stream,  nocturia.  No dysuria or hematuria.  MUSCULOSKELETAL:  Back pain and  knee pain.   PHYSICAL EXAMINATION:  VITAL SIGNS:  Pulse 64, respirations 12, blood  pressure 122/64.  GENERAL:  A 77 year old white female, well-nourished, well-developed, in  no acute distress, short stature, overweight.  HEENT:  Normocephalic,  atraumatic.  Pupils are round and reactive.  Oropharynx clear.  EOMs  intact.  NECK:  Supple.  CHEST:  Clear, anterior and posterior chest walls.  HEART:  Regular rate and rhythm.  No murmurs, S1-S2 noted.  ABDOMEN:  Soft, nontender, round protuberant abdomen.  Bowel sounds  present.  RECTAL, BREASTS AND GENITALIA:  Not done and not pertinent to present  illness.  EXTREMITIES:  Right knee:  Marked crepitus.  Range of motion 5-115.  No  instability.   IMPRESSION:  1. Osteoarthritis, right knee.  2. History of asthma.  3. History of bronchitis.  4. History of pneumonia.  5. Hypertension.  6. Cardiac murmur.  7. Hypercholesterolemia.  8. Renal disease.  9. Hypothyroidism.  10.History of melanoma.  11.History of ovarian cancer.  12.Degenerative disk disease.  13.History of gout.  14.Postmenopausal.   PLAN:  The patient will be admitted to [facility] to undergo a  right total knee arthroplasty.  Surgery will be performed by Dr. Gaynelle Arabian.      [name], P.A.C.      Gaynelle Arabian, M.D.  Electronically Signed    ALP/MEDQ  D:  07/19/2007  T:  07/20/2007  Job:  [id number]   cc:   Dellis Filbert A. Sherren Mocha, [place]  [place] [address]

## 2010-11-18 NOTE — Op Note (Signed)
NAMERONDA, LUBARSKY              ACCOUNT NO.:  [id number]   MEDICAL RECORD NO.:  [id number]          PATIENT TYPE:  INP   LOCATION:  0008                         FACILITY:  [facility]   PHYSICIAN:  Gaynelle Arabian, M.D.    DATE OF BIRTH:  Jun 03, 1934   DATE OF PROCEDURE:  07/20/2007  DATE OF DISCHARGE:                               OPERATIVE REPORT   PREOPERATIVE DIAGNOSIS:  Osteoarthritis, right knee.   POSTOPERATIVE DIAGNOSIS:  Osteoarthritis, right knee.   PROCEDURE:  Right total knee arthroplasty.   SURGEON:  Gaynelle Arabian, M.D.   ASSISTANT:  Alexzandrew L. Perkins, P.A.C.   ANESTHESIA:  Spinal.   ESTIMATED BLOOD LOSS:  Minimal.   DRAINS:  None.   TOURNIQUET TIME:  27 minutes at 300 mmHg.   COMPLICATIONS:  None.   CONDITION:  Stable to recovery.   CLINICAL NOTE:  Ms. Bester is a 77 year old female who has end stage  arthritis of the right knee with progressively worsening pain and  dysfunction.  She has failed nonoperative management and presents for  total knee arthroplasty.   PROCEDURE IN DETAIL:  After the successful administration of spinal  anesthetic, a tourniquet is placed high on the right thigh and the right  lower extremity prepped and draped in the usual sterile fashion.  The  extremity is wrapped in an Esmarch, the knee flexed, and the tourniquet  inflated to 300 mmHg.  A midline incision was made with a 10 blade  through the subcutaneous tissue to the extensor mechanism.  A fresh  blade is used make a medial parapatellar arthrotomy.  Soft tissue over  the proximal medial tibia is subperiosteally elevated to the joint line  with the knife and into the semimembranosus bursa with a Cobb elevator.  Soft tissue laterally is elevated with attention being paid to avoid the  patellar tendon on the tibial tubercle.  The patella subluxed laterally,  knee flexed 90 degrees, ACL and PCL were removed.  A drill was used to  create a starting hole in the distal femur and  the canal was thoroughly  irrigated.  The 5 degrees right valgus alignment guide is placed  referencing off the posterior condyles, rotation is marked, and the  block pinned to remove 11 mm off the distal femur.  I took 11 because of  preop flexion contracture.  Distal femoral resection was made with an  oscillating saw and sizing block is placed.  The size 3 is the most  appropriate.  The rotation is marked off the epicondylar axis.  A size 3  cutting block is placed and the anterior, posterior and chamfer cuts are  performed.  The tibia is then subluxed forward and the menisci removed.  The extramedullary tibial alignment guide is placed referencing  proximally at the medial aspect of the tibial tubercle and distally  along the second metatarsal axis and tibial crest.  The block is pinned  to remove approximately 10 mm off the non-deficient lateral side.  Tibial resection is made with an oscillating saw.  Size 3 is the most  appropriate tibial component and the proximal tibia  is prepared with the  modular drill and keel punch for a size 3.  Femoral preparation is  completed with the intercondylar cut.   A size 3 mobile bearing tibial trial, size 3 posterior stabilized  femoral trial, and 10 mm posterior stabilized rotating platform insert  trial was placed.  With a 10, full extension is achieved with excellent  varus and valgus as well as anterior and posterior stability and motion.  The patella was everted and thickness measured to be approximately 23  mm.  Freehand resection to 14 mm, 38 template is placed, lug holes are  drilled, trial patella is placed and it tracks normally.  Osteophytes  are removed off the posterior femur with the trial in place.  All trials  are removed and the cut bone surfaces are prepared with pulsatile  lavage.  The cement is mixed and once ready for implantation, the size 3  mobile bearing tibial tray, size 3 posterior stabilized femur, and 38  patella are  cemented in place.  The patella is held with the clamp.  A  trial 10 mm insert is placed and the knee held in full extension, all  extruded cement removed.  Once the cement is fully hardened, then the  permanent 10 mm posterior stabilized rotating platform insert is placed  to the tibial tray.  The wound is copiously irrigated with saline  solution.  FloSeal is placed in the medial and lateral gutters and  suprapatellar area.  A moist sponge is placed and tourniquet released  with a total time of 27 minutes.  The sponge was held for two minutes,  removed, and minor bleeding is encountered.  All encountered bleeding  was stopped with electrocautery.  The wound was again irrigated and the  extensor mechanism closed with interrupted #1 PDS.  Flexion against  gravity to 135 degrees.  Subcu was closed with interrupted 2-0 Vicryl  and subcuticular with running 4-0 Monocryl.  The incision was cleaned  and dried and Steri-Strips and a bulky sterile dressing applied.  She is  then placed in a knee immobilizer, awakened, and transferred to the  recovery room in stable addition.      Gaynelle Arabian, M.D.  Electronically Signed     FA/MEDQ  D:  07/20/2007  T:  07/20/2007  Job:  [id number]

## 2010-11-21 NOTE — Discharge Summary (Signed)
[name], [name]              ACCOUNT NO.:  [id number]   MEDICAL RECORD NO.:  [id number]          PATIENT TYPE:  INP   LOCATION:  [id number]                         FACILITY:  [facility]   PHYSICIAN:  Gaynelle Arabian, M.D.    DATE OF BIRTH:  Feb 01, 1934   DATE OF ADMISSION:  07/20/2007  DATE OF DISCHARGE:  07/24/2007                               DISCHARGE SUMMARY   ADMISSION DIAGNOSES:  1. Osteoarthritis right knee.  2. History of asthma.  3. History of bronchitis.  4. History of pneumonia.  5. Hypertension.  6. Cardiac murmur.  7. Hypercholesterolemia.  8. Renal disease.  9. Hypothyroidism.  10.History of melanoma.  11.History of ovarian cancer.  12.Degenerative disk disease.  13.History of gout.  14.Postmenopausal.   DISCHARGE DIAGNOSES:  1. Osteoarthritis right knee status post right total knee      arthroplasty.  2. Acute blood loss anemia.  3. Status post transfusion without sequelae.  4. History of asthma.  5. History of bronchitis.  6. History of pneumonia.  7. Hypertension.  8. Cardiac murmur.  9. Hypercholesterolemia.  10.Renal disease.  11.Hypothyroidism.  12.History of melanoma.  13.History of ovarian cancer.  14.Degenerative disk disease.  15.History of gout.  16.Postmenopausal.   PROCEDURE:  July 20, 2007 right total knee.  Surgeon Dr. Wynelle Link.  Assistant Laverta Baltimore PA-C.   ANESTHESIA:  Spinal anesthesia.   CONSULTATIONS:  None.   BRIEF HISTORY:  [name] is a 77 year old female with end-stage  arthritis of the right knee with progressive worsening pain and  dysfunction, failed operative management, now presents for total knee  arthroplasty.   LABORATORY DATA:  Preop CBC shows hemoglobin 12.7, hematocrit 37.4,  white cell count 8.6.  Postop hemoglobin 9.8, drifting down to 9.1, then  to 8.5.  Given 2 units of blood.  Post-discharge hemoglobin 10.4 and  29.6.  PT/PTT preop 12.1 and 30 respectively.  INR 0.9.  Serial pro-  times followed.   PT/INR 19.9 and 1.6.  Chem panel on admission all  within normal limits except with a creatinine elevated at 1.28.  Serial  BMETs were followed.  Creatinine did go up from 0.28 to 1.44 back down  to lower level than preop of 1.24.  Remaining electrolytes remained  within normal limits.  Preop UA trace leukocyte esterase, few epithelial  cells, 0-2 white cells, otherwise negative.  Blood group type O+.   DIAGNOSTICS:  EKG Dec 02, 2006:  Sinus rhythm, normal EKG   HOSPITAL COURSE:  The patient admitted to [facility] and  tolerated the procedure well.  Later transferred to the recovery room on  the orthopedic floor.  Started on PCA and p.o. analgesic for pain  control following surgery.  Had low urinary output and gave a fluid  challenge.  Hemoglobin was down to 9.8.  Started on her iron.  Encouraged pulmonary toilet.  Started back on home medications.  Had a  history of renal disease.  Had preop level of 1.28.  She did creep up a  little bit to 1.44 postoperatively.  BUN remained stable.  By day 2, her  output  had improved and the creatinine was back down to its preop level  of 1.2.  Started getting up out of bed on day 1.  By day 2, she was  doing a little bit better.  Dressing change and incision looked good.  Progressing with therapy, walking about 24 feet.  Continued to diurese  fluids on a positive volume.  By day 3, she was doing better.  Unfortunately, hemoglobin drifted down a little bit further down to 8.5.  Felt she would benefit from blood.  She was given 2 units of blood and  kept on postop day 3.  Tolerated the blood well.  By postop day 4,  hemoglobin was back up to 10.4, progressing with therapy, walking 45  feet, doing well and was discharged home.   DISPOSITION:  The patient is discharged home on July 24, 2007.   DISCHARGE MEDICATIONS:  1. Percocet.  2. Robaxin.  3. Coumadin.   ACTIVITY:  Weightbearing as tolerated right leg, home PT and home health   nursing.   DIET:  Heart-healthy, low-cholesterol diet.   FOLLOW UP:  Follow up in 2 weeks.   CONDITION ON DISCHARGE:  Improved.      Alexzandrew L. Perkins, P.A.C.      Gaynelle Arabian, M.D.  Electronically Signed    ALP/MEDQ  D:  08/23/2007  T:  08/23/2007  Job:  [id number]   cc:   Dellis Filbert A. Sherren Mocha, MD  [address]  [place]  [place] [address]   Gaynelle Arabian, M.D.  Fax: [phone]

## 2010-11-21 NOTE — Op Note (Signed)
[facility]  Patient:    [name], [name]                     MRN: [id number] Proc. Date: 05/31/00 Adm. Date:  [id number] Attending:  Oletha Cruel CC:         Joycelyn Man, M.D. [facility]   Operative Report  ACCOUNT:  [id number]  CCS:  [id number]  PREOPERATIVE DIAGNOSIS:  Chronic calculus cholecystitis.  POSTOPERATIVE DIAGNOSES: 1. Chronic calculus cholecystitis. 2. Accidental enterotomy.  OPERATIONS:  Laparoscopic cholecystectomy with repair of enterotomy.  SURGEON:  Haywood Lasso, M.D.  ASSISTANT:  Ward Givens, M.D.  ANESTHESIA:  General endotracheal.  HISTORY:  This patient is a 77 year old who has had prior abdominal surgery and presented with gallstones.  It was elected to try laparoscopic cholecystectomy with possibility of needing to convert to open.  DESCRIPTION OF PROCEDURE:  The patient was taken to the operating room and after satisfactory general endotracheal anesthesia obtained, the abdomen was prepped and draped.  I injected some Marcaine around the umbilicus and made an initial incision in the umbilicus staying just to the right of the prior scar. ______ used to divide the fascia and picked up.  I identified some Prolene suture and gently incised the fascia but on doing so, there was a loop of small bowel basically adhered right at that point, and an enterotomy was made. The incision was enlarged to about 2.5 inches, and the fascia opened at length, and the loop of small bowel that was stuck densely here freed up completely so that I had it completely off the fascia.  The enterotomy was repaired transversely with several sutures of 3-0 silk producing a solid repair with no decrease in lumen size.  Another loop of small bowel adjacent here was also taken down so that I could then close the fascia.  Using my finger, I could feel that I was free most of the way into the right upper quadrant.  I partially closed  the fascial incision with interrupted 0 Prolenes, leaving a small opening that was closed with a U suture that was left out to tie.  The Hasson was introduced and the U suture used to anchor the Hasson.  The abdomen was insufflated, and the camera was placed.  I could see into the right upper quadrant, and there was a small window through some adhesions.  I was able to get in and then see at the liver that I had access into the right upper quadrant at the epigastrium.  The patient was placed in reverse Trendelenburg and tilted to the left and locally infiltrated again, and the epigastric port placed and secure.  Using scissors, I took down multiple omental adhesions in the right upper quadrant so that I had a little more freedom to see the liver. This was essentially done in an avascular plane, and cautery was note used. This freed me up enough to see the gallbladder and place two 5 mm ports.  The gallbladder was retracted and omental and duodenal adhesions taken down to free up the bottom of the gallbladder and dissection begun at triangle of Calot.  The cystic duct and cystic artery were identified and cleaned off. The peritoneum opened so that I had a good window here.  I could see what appeared to be the hepatic artery curled up just posteriorly and stayed away from that.  The cystic artery was triply clipped and divided giving me a little more  mobility and to clarify the anatomy with the hepatic artery.  The cystic duct was then triply clipped and divided.  The gallbladder was removed from below to above with coagulation cautery.  At this point, I disconnected, irrigated, and made sure everything was dry, and checked the omental places where we had taken down adhesions, and again, everything appeared to be dry.  The camera was placed in the epigastric port and the gallbladder removed from the umbilical port.  The umbilical port was then closed with the U suture that was previously  placed and an additional suture of Prolene.  The abdomen was deflated to the epigastric port.  The skin incisions were closed with 4-0 Monocryl subcuticular plus Steri-Strips.  I did use some Telfa pack at the umbilicus.  The patient tolerated the procedure well.  The only complication was the enterotomy which was recognized and repaired.  All counts were correct. DD:  05/31/00 TD:  05/31/00 Job: [id number]

## 2010-11-21 NOTE — Discharge Summary (Signed)
[facility]  Patient:    [name], [name]                     MRN: [id number] Adm. Date:  [id number] Disc. Date: 06/02/00 Attending:  Oletha Cruel CC:         [name], M.D. [facility]   Discharge Summary  ACCOUNT:  [id number]  OFFICE MEDICAL RECORD NUMBER:  [id number].  FINAL DIAGNOSES: 1. Chronic calculus cholecystitis. 2. Enterotomy with enterotomy repair.  CLINICAL HISTORY:  [name] is a 77 year old with biliary tract disease and admitted for elective cholecystectomy.  She had prior surgery and we planned to do an attempted laparoscopic approach.  HOSPITAL COURSE:  She was admitted, taken to the operating room.  During the open laparoscopy approach small bowel was entered and repaired.  However, we were able to free up enough to get the laparoscope in and perform laparoscopic cholecystectomy.  She tolerated the procedure well.  Postoperatively, she had a benign course.  The next morning she was essentially pain-free.  I left her n.p.o. overnight because of the small bowel injury and the first postoperative day started her on clear liquids.  The next day she was feeling fine, up and around, ambulating and when I came to see her already dressed to go home.  Her abdomen was benign and her wounds were clean with no evidence of infection.  CONDITION ON DISCHARGE:  Discharge in stable condition.  DISCHARGE MEDICATIONS: 1. Tylox for pain. 2. Augmentin 875 b.i.d. for five days due to the small bowel injury.  FOLLOWUP:  Followup in my office in approximately one week.  DIET/ACTIVITY:  She is able to resume a regular diet and limited activities. DD:  06/02/00 TD:  06/02/00 Job: [id number]

## 2010-12-05 ENCOUNTER — Other Ambulatory Visit: Payer: Self-pay | Admitting: Family Medicine

## 2010-12-05 DIAGNOSIS — Z1231 Encounter for screening mammogram for malignant neoplasm of breast: Secondary | ICD-10-CM

## 2010-12-28 ENCOUNTER — Other Ambulatory Visit: Payer: Self-pay | Admitting: Family Medicine

## 2011-01-29 ENCOUNTER — Ambulatory Visit
Admission: RE | Admit: 2011-01-29 | Discharge: 2011-01-29 | Disposition: A | Discharge status: Home / Self Care | Payer: Managed Care, Other (non HMO) | Source: Ambulatory Visit | Attending: Family Medicine | Admitting: Family Medicine

## 2011-01-29 DIAGNOSIS — Z1231 Encounter for screening mammogram for malignant neoplasm of breast: Secondary | ICD-10-CM

## 2011-02-03 ENCOUNTER — Other Ambulatory Visit (INDEPENDENT_AMBULATORY_CARE_PROVIDER_SITE_OTHER): Payer: Managed Care, Other (non HMO)

## 2011-02-03 DIAGNOSIS — Z0000 Encounter for general adult medical examination without abnormal findings: Secondary | ICD-10-CM

## 2011-02-03 LAB — CBC WITH DIFFERENTIAL/PLATELET
HCT: 41.2 % (ref 36.0–46.0)
Hemoglobin: 12.6 g/dL (ref 12.0–15.0)
Lymphocytes Relative: 37.5 % (ref 12.0–46.0)
Lymphs Abs: 2.7 10*3/uL (ref 0.7–4.0)
MCHC: 30.7 g/dL (ref 30.0–36.0)
Monocytes Relative: 9.7 % (ref 3.0–12.0)
Neutro Abs: 3.4 10*3/uL (ref 1.4–7.7)
Neutrophils Relative %: 46 % (ref 43.0–77.0)
Platelets: 249 10*3/uL (ref 150.0–400.0)
RDW: 16 % — ABNORMAL HIGH (ref 11.5–14.6)
WBC: 7.3 10*3/uL (ref 4.5–10.5)

## 2011-02-03 LAB — HEPATIC FUNCTION PANEL
ALT: 18 U/L (ref 0–35)
AST: 22 U/L (ref 0–37)
Bilirubin, Direct: 0 mg/dL (ref 0.0–0.3)
Total Bilirubin: 0.5 mg/dL (ref 0.3–1.2)
Total Protein: 7.6 g/dL (ref 6.0–8.3)

## 2011-02-03 LAB — BASIC METABOLIC PANEL
BUN: 30 mg/dL — ABNORMAL HIGH (ref 6–23)
CO2: 23 mEq/L (ref 19–32)
Calcium: 9.8 mg/dL (ref 8.4–10.5)
Sodium: 138 mEq/L (ref 135–145)

## 2011-02-03 LAB — POCT URINALYSIS DIPSTICK
Bilirubin, UA: NEGATIVE
Glucose, UA: NEGATIVE
Nitrite, UA: NEGATIVE
Urobilinogen, UA: 0.2

## 2011-02-03 LAB — LIPID PANEL
Cholesterol: 170 mg/dL (ref 0–200)
LDL Cholesterol: 66 mg/dL (ref 0–99)
Total CHOL/HDL Ratio: 2
VLDL: 29 mg/dL (ref 0.0–40.0)

## 2011-02-09 ENCOUNTER — Encounter: Payer: Self-pay | Admitting: Family Medicine

## 2011-02-11 ENCOUNTER — Other Ambulatory Visit: Payer: Self-pay | Admitting: Family Medicine

## 2011-02-11 MED ORDER — ESTROGENS CONJUGATED 0.3 MG PO TABS: ORAL_TABLET | ORAL | Status: DC

## 2011-02-16 ENCOUNTER — Encounter: Payer: Self-pay | Admitting: Family Medicine

## 2011-02-16 ENCOUNTER — Ambulatory Visit (INDEPENDENT_AMBULATORY_CARE_PROVIDER_SITE_OTHER): Payer: Managed Care, Other (non HMO) | Admitting: Family Medicine

## 2011-02-16 DIAGNOSIS — Z0000 Encounter for general adult medical examination without abnormal findings: Secondary | ICD-10-CM

## 2011-02-16 DIAGNOSIS — J309 Allergic rhinitis, unspecified: Secondary | ICD-10-CM

## 2011-02-16 DIAGNOSIS — E785 Hyperlipidemia, unspecified: Secondary | ICD-10-CM

## 2011-02-16 DIAGNOSIS — E039 Hypothyroidism, unspecified: Secondary | ICD-10-CM

## 2011-02-16 DIAGNOSIS — I10 Essential (primary) hypertension: Secondary | ICD-10-CM

## 2011-02-16 DIAGNOSIS — M1990 Unspecified osteoarthritis, unspecified site: Secondary | ICD-10-CM

## 2011-02-16 DIAGNOSIS — M109 Gout, unspecified: Secondary | ICD-10-CM

## 2011-02-16 MED ORDER — BECLOMETHASONE DIPROPIONATE 40 MCG/ACT IN AERS: 2.0000 | INHALATION_SPRAY | Freq: Two times a day (BID) | RESPIRATORY_TRACT | Status: DC

## 2011-02-16 MED ORDER — FUROSEMIDE 40 MG PO TABS: 40.0000 mg | ORAL_TABLET | Freq: Every day | ORAL | Status: DC

## 2011-02-16 MED ORDER — ALLOPURINOL 300 MG PO TABS: 300.0000 mg | ORAL_TABLET | Freq: Every day | ORAL | Status: DC

## 2011-02-16 MED ORDER — ESTROGENS CONJUGATED 0.3 MG PO TABS: ORAL_TABLET | ORAL | Status: DC

## 2011-02-16 MED ORDER — POTASSIUM CHLORIDE CRYS ER 20 MEQ PO TBCR: 20.0000 meq | EXTENDED_RELEASE_TABLET | Freq: Every day | ORAL | Status: DC

## 2011-02-16 MED ORDER — LEVOTHYROXINE SODIUM 50 MCG PO TABS: 50.0000 ug | ORAL_TABLET | Freq: Every day | ORAL | Status: DC

## 2011-02-16 MED ORDER — SIMVASTATIN 20 MG PO TABS: 20.0000 mg | ORAL_TABLET | Freq: Every day | ORAL | Status: DC

## 2011-02-16 NOTE — Patient Instructions (Signed)
Continue your current medications.  Check with the pharmacy about the cost of ex- forge  versus purchasing both generic products.  Soaked  And  file your great toenail.  Weekly,................ to get rid of the nail and then the infection would heal  Return in one year or sooner if any problem.  Walk 20 minutes daily

## 2011-02-16 NOTE — Progress Notes (Signed)
  Subjective:    Patient ID: [name], female    DOB: 1933-10-26, 77 y.o.   MRN: [id number]  HPI Signe is a 77 year old, married female, nonsmoker, who comes in today for a Medicare wellness examination because of a history of gout, hypertension, hypothyroidism, renal insufficiency, asthma, hyperlipidemia, obesity.  Current medications reviewed in detail the been no changes.  She continues to see the nephrologist on a regular basis because of renal insufficiency.  She gets routine eye care, hearing normal, regular dental care, activities of daily living.  She walks on a daily basis.  Cognitive function, normal, home health safety reviewed.  No issues identified, no guns in the house, she does have a healthcare power of attorney and living well.  Colonoscopy normal.  GI, tetanus, 2007, Pneumovax, complete, shingles 2009  She has a history of some infected right ingrown great toenail, ........ it's bothering her recently.  She would like it checked   Review of Systems  Constitutional: Negative.   HENT: Negative.   Eyes: Negative.   Respiratory: Negative.   Cardiovascular: Negative.   Gastrointestinal: Negative.   Genitourinary: Negative.   Musculoskeletal: Negative.   Neurological: Negative.   Hematological: Negative.   Psychiatric/Behavioral: Negative.        Objective:   Physical Exam  Constitutional: She appears well-developed and well-nourished.  HENT:  Head: Normocephalic and atraumatic.  Right Ear: External ear normal.  Left Ear: External ear normal.  Nose: Nose normal.  Mouth/Throat: Oropharynx is clear and moist.  Eyes: Conjunctivae and EOM are normal. Pupils are equal, round, and reactive to light.  Neck: Normal range of motion. Neck supple. No JVD present. No tracheal deviation present. No thyromegaly present.  Cardiovascular: Normal rate, regular rhythm, normal heart sounds and intact distal pulses.  Exam reveals no gallop and no friction rub.   No  murmur heard. Pulmonary/Chest: Effort normal and breath sounds normal. No stridor.  Abdominal: Soft. Bowel sounds are normal. She exhibits no distension and no mass. There is no tenderness. There is no rebound.  Genitourinary: Vagina normal. Guaiac negative stool. No vaginal discharge found.  Musculoskeletal: Normal range of motion.  Lymphadenopathy:    She has no cervical adenopathy.  Neurological: She is alert. She has normal reflexes. No cranial nerve deficit. She exhibits normal muscle tone. Coordination normal.  Skin: Skin is warm and dry.       Total body skin exam shows no evidence of any abnormal-appearing lesions.  The right great toenail is slightly erythematous and thick with  A fungal infection  Psychiatric: She has a normal mood and affect. Her behavior is normal. Judgment and thought content normal.          Assessment & Plan:  Healthy female.  History of hypertension.  Continue current medications.  History of gout,,,, continue allopurinol.  Postmenopausal hot flashes continue Premarin .3, one tablet twice weekly.  History of hypothyroidism.  Continue Synthroid as directed.  History of asthma.  Continue Qvar 42 puffs b.i.d.  History of hyperlipidemia.  Continue simvastatin 20 daily.  Slight infection, right great toenail with fungus.  Plan soak in file.  Return p.r.n.

## 2011-03-25 LAB — URINE MICROSCOPIC-ADD ON

## 2011-03-25 LAB — COMPREHENSIVE METABOLIC PANEL
ALT: 22
Alkaline Phosphatase: 79
BUN: 22
CO2: 27
GFR calc non Af Amer: 41 — ABNORMAL LOW
Glucose, Bld: 114 — ABNORMAL HIGH
Potassium: 4.1
Sodium: 135
Total Bilirubin: 0.7
Total Protein: 7.3

## 2011-03-25 LAB — TYPE AND SCREEN: Antibody Screen: NEGATIVE

## 2011-03-25 LAB — ABO/RH: ABO/RH(D): O POS

## 2011-03-25 LAB — URINALYSIS, ROUTINE W REFLEX MICROSCOPIC
Bilirubin Urine: NEGATIVE
Glucose, UA: NEGATIVE
Hgb urine dipstick: NEGATIVE
Ketones, ur: NEGATIVE
Protein, ur: NEGATIVE
Urobilinogen, UA: 0.2

## 2011-03-25 LAB — PROTIME-INR
INR: 0.9
Prothrombin Time: 12.1

## 2011-03-25 LAB — CBC
MCHC: 33.9
RBC: 4.01
WBC: 8.6

## 2011-03-26 LAB — BASIC METABOLIC PANEL
BUN: 22
Calcium: 8.6
Creatinine, Ser: 1.44 — ABNORMAL HIGH
GFR calc Af Amer: 43 — ABNORMAL LOW
GFR calc Af Amer: 52 — ABNORMAL LOW
GFR calc non Af Amer: 36 — ABNORMAL LOW
GFR calc non Af Amer: 43 — ABNORMAL LOW
Potassium: 4.3
Potassium: 4.9
Sodium: 136

## 2011-03-26 LAB — PROTIME-INR
INR: 1.2
INR: 1.6 — ABNORMAL HIGH
Prothrombin Time: 13.3
Prothrombin Time: 19.9 — ABNORMAL HIGH

## 2011-03-26 LAB — CBC
HCT: 24.7 — ABNORMAL LOW
HCT: 28.7 — ABNORMAL LOW
Hemoglobin: 8.5 — ABNORMAL LOW
Hemoglobin: 9.1 — ABNORMAL LOW
Platelets: 226
RBC: 2.65 — ABNORMAL LOW
RBC: 2.82 — ABNORMAL LOW
RBC: 3.1 — ABNORMAL LOW
WBC: 10.7 — ABNORMAL HIGH
WBC: 9.8

## 2011-03-26 LAB — HEMOGLOBIN AND HEMATOCRIT, BLOOD
HCT: 29.6 — ABNORMAL LOW
Hemoglobin: 10.4 — ABNORMAL LOW

## 2011-03-26 LAB — PREPARE RBC (CROSSMATCH)

## 2011-07-02 ENCOUNTER — Telehealth: Payer: Self-pay | Admitting: Family Medicine

## 2011-07-02 NOTE — Telephone Encounter (Signed)
Pt aware of appt on 07/03/11 at 12:45.

## 2011-07-02 NOTE — Telephone Encounter (Signed)
Pt experiencing Fever of 100, head and chest congestion, coughing up mucus, pt has has been experiencing symptoms for a few week and is requesting to be seen today. Please advise

## 2011-07-03 ENCOUNTER — Ambulatory Visit (INDEPENDENT_AMBULATORY_CARE_PROVIDER_SITE_OTHER): Payer: Managed Care, Other (non HMO) | Admitting: Family Medicine

## 2011-07-03 ENCOUNTER — Encounter: Payer: Self-pay | Admitting: Family Medicine

## 2011-07-03 VITALS — BP 118/78 | Temp 98.2°F | Wt 200.0 lb

## 2011-07-03 DIAGNOSIS — J45901 Unspecified asthma with (acute) exacerbation: Secondary | ICD-10-CM

## 2011-07-03 MED ORDER — CLARITHROMYCIN 500 MG PO TABS: 500.0000 mg | ORAL_TABLET | Freq: Two times a day (BID) | ORAL | Status: AC

## 2011-07-03 MED ORDER — PREDNISONE 20 MG PO TABS: ORAL_TABLET | ORAL | Status: DC

## 2011-07-03 MED ORDER — HYDROCODONE-HOMATROPINE 5-1.5 MG/5ML PO SYRP: ORAL_SOLUTION | ORAL | Status: DC

## 2011-07-03 NOTE — Progress Notes (Signed)
  Subjective:    Patient ID: [name], female    DOB: 1933-08-05, 77 y.o.   MRN: [id number]  HPI [name] is a 77 year old, married female, nonsmoker, who comes in today with a 4 week history of cough.  She states she developed a cold about 4 weeks ago and her cold symptoms gradually abated over a couple weeks, then before getting well.  She began coughing worsened wheezing.  No fever no sputum production.  She has had a history of allergic rhinitis and asthma in the past   Review of Systems    General and pulmonary review of systems otherwise negative Objective:   Physical Exam  Well-developed well-nourished, female, in no acute distress inspiratory rate 12 and unlabored.  HEENT negative.  Neck was supple.  No adenopathy.  Lungs show symmetrical.  Breath sounds, mild to moderate late expiratory wheezing, symmetrical      Assessment & Plan:  Asthma plan increase fluids cover with antibiotics begin prednisone.  Follow-up in 4 days

## 2011-07-03 NOTE — Patient Instructions (Signed)
Rest at home.  Drink lots of water.  Beginning the prednisone as directed.  Hydromet one half to 1 teaspoon 3 times daily for cough.  The atb . Is Biaxin one twice daily............. Side effects of Biaxin.  Includes abdominal cramping, and diarrhea.  If he developed these symptoms stop the medication immediately  Return Monday, at 9 a.m. For follow-up

## 2011-07-06 ENCOUNTER — Ambulatory Visit (INDEPENDENT_AMBULATORY_CARE_PROVIDER_SITE_OTHER): Payer: Managed Care, Other (non HMO) | Admitting: Family Medicine

## 2011-07-06 ENCOUNTER — Encounter: Payer: Self-pay | Admitting: Family Medicine

## 2011-07-06 DIAGNOSIS — J45901 Unspecified asthma with (acute) exacerbation: Secondary | ICD-10-CM

## 2011-07-06 NOTE — Progress Notes (Signed)
  Subjective:    Patient ID: [name], female    DOB: 03/01/1934, 77 y.o.   MRN: [id number]  HPI Ev.  is a 77 year old, married female, nonsmoker, who comes in today For evaluation of asthma.  We saw her last week with a flareup in her asthma.  She comes in today for follow-up saying she is about 50% better.  She has dropped down to two prednisone tablets daily.  She is able to sleep at night.  No shortness of breath with exertion   Review of Systems    General and pulmonary is systems otherwise negative Objective:   Physical Exam Well developed, well nourished, female no acute distress.  HEENT negative.  Neck was supple.  Lungs were clear except for bilateral symmetrical mild late expiratory wheezing      Assessment & Plan:  Asthma improving slowly plan two tabs of prednisone x 5 days, then begin tapering return p.r.n.

## 2011-07-06 NOTE — Patient Instructions (Signed)
Take your prednisone two tabs x 5 days, then Saturday begin tapering one tablet 5 days as directed on the bottle.  Return p.r.n.

## 2011-07-23 DIAGNOSIS — E213 Hyperparathyroidism, unspecified: Secondary | ICD-10-CM | POA: Diagnosis not present

## 2011-07-23 DIAGNOSIS — N184 Chronic kidney disease, stage 4 (severe): Secondary | ICD-10-CM | POA: Diagnosis not present

## 2011-07-23 DIAGNOSIS — I129 Hypertensive chronic kidney disease with stage 1 through stage 4 chronic kidney disease, or unspecified chronic kidney disease: Secondary | ICD-10-CM | POA: Diagnosis not present

## 2011-07-23 DIAGNOSIS — M109 Gout, unspecified: Secondary | ICD-10-CM | POA: Diagnosis not present

## 2011-07-23 DIAGNOSIS — D649 Anemia, unspecified: Secondary | ICD-10-CM | POA: Diagnosis not present

## 2011-11-16 DIAGNOSIS — N184 Chronic kidney disease, stage 4 (severe): Secondary | ICD-10-CM | POA: Diagnosis not present

## 2011-11-16 DIAGNOSIS — I10 Essential (primary) hypertension: Secondary | ICD-10-CM | POA: Diagnosis not present

## 2011-11-16 DIAGNOSIS — E785 Hyperlipidemia, unspecified: Secondary | ICD-10-CM | POA: Diagnosis not present

## 2011-11-16 DIAGNOSIS — M109 Gout, unspecified: Secondary | ICD-10-CM | POA: Diagnosis not present

## 2011-11-16 DIAGNOSIS — I129 Hypertensive chronic kidney disease with stage 1 through stage 4 chronic kidney disease, or unspecified chronic kidney disease: Secondary | ICD-10-CM | POA: Diagnosis not present

## 2011-11-16 DIAGNOSIS — E213 Hyperparathyroidism, unspecified: Secondary | ICD-10-CM | POA: Diagnosis not present

## 2012-01-29 ENCOUNTER — Other Ambulatory Visit: Payer: Self-pay | Admitting: Family Medicine

## 2012-01-29 DIAGNOSIS — Z1231 Encounter for screening mammogram for malignant neoplasm of breast: Secondary | ICD-10-CM

## 2012-02-11 ENCOUNTER — Ambulatory Visit
Admission: RE | Admit: 2012-02-11 | Discharge: 2012-02-11 | Disposition: A | Discharge status: Home / Self Care | Payer: Medicare Other | Source: Ambulatory Visit | Attending: Family Medicine | Admitting: Family Medicine

## 2012-02-11 DIAGNOSIS — Z1231 Encounter for screening mammogram for malignant neoplasm of breast: Secondary | ICD-10-CM | POA: Diagnosis not present

## 2012-02-17 ENCOUNTER — Other Ambulatory Visit: Payer: Self-pay | Admitting: Family Medicine

## 2012-02-26 ENCOUNTER — Other Ambulatory Visit: Payer: Self-pay | Admitting: Family Medicine

## 2012-03-01 ENCOUNTER — Other Ambulatory Visit: Payer: Self-pay | Admitting: Family Medicine

## 2012-03-02 ENCOUNTER — Other Ambulatory Visit (INDEPENDENT_AMBULATORY_CARE_PROVIDER_SITE_OTHER): Payer: Medicare Other

## 2012-03-02 DIAGNOSIS — I10 Essential (primary) hypertension: Secondary | ICD-10-CM

## 2012-03-02 DIAGNOSIS — D649 Anemia, unspecified: Secondary | ICD-10-CM

## 2012-03-02 DIAGNOSIS — E785 Hyperlipidemia, unspecified: Secondary | ICD-10-CM

## 2012-03-02 DIAGNOSIS — Z0000 Encounter for general adult medical examination without abnormal findings: Secondary | ICD-10-CM | POA: Diagnosis not present

## 2012-03-02 DIAGNOSIS — E109 Type 1 diabetes mellitus without complications: Secondary | ICD-10-CM

## 2012-03-02 DIAGNOSIS — E039 Hypothyroidism, unspecified: Secondary | ICD-10-CM

## 2012-03-02 LAB — CBC WITH DIFFERENTIAL/PLATELET
Eosinophils Relative: 7.1 % — ABNORMAL HIGH (ref 0.0–5.0)
HCT: 37 % (ref 36.0–46.0)
Hemoglobin: 12.1 g/dL (ref 12.0–15.0)
Lymphs Abs: 2.6 10*3/uL (ref 0.7–4.0)
MCV: 92.3 fl (ref 78.0–100.0)
Monocytes Absolute: 0.8 10*3/uL (ref 0.1–1.0)
Monocytes Relative: 10.7 % (ref 3.0–12.0)
Neutro Abs: 3.6 10*3/uL (ref 1.4–7.7)
Platelets: 228 10*3/uL (ref 150.0–400.0)
RDW: 16.2 % — ABNORMAL HIGH (ref 11.5–14.6)

## 2012-03-02 LAB — POCT URINALYSIS DIPSTICK
Bilirubin, UA: NEGATIVE
Blood, UA: NEGATIVE
Glucose, UA: NEGATIVE
Nitrite, UA: NEGATIVE
Spec Grav, UA: 1.01

## 2012-03-02 LAB — BASIC METABOLIC PANEL
BUN: 34 mg/dL — ABNORMAL HIGH (ref 6–23)
Chloride: 106 mEq/L (ref 96–112)
GFR: 34.77 mL/min — ABNORMAL LOW (ref >60.00)
Glucose, Bld: 90 mg/dL (ref 70–99)
Potassium: 3.8 mEq/L (ref 3.5–5.1)

## 2012-03-02 LAB — HEPATIC FUNCTION PANEL
ALT: 14 U/L (ref 0–35)
AST: 24 U/L (ref 0–37)
Bilirubin, Direct: 0.1 mg/dL (ref 0.0–0.3)
Total Bilirubin: 0.6 mg/dL (ref 0.3–1.2)

## 2012-03-02 LAB — TSH: TSH: 3.42 u[IU]/mL (ref 0.35–5.50)

## 2012-03-02 LAB — MICROALBUMIN / CREATININE URINE RATIO
Creatinine,U: 170 mg/dL
Microalb Creat Ratio: 0.9 mg/g (ref 0.0–30.0)

## 2012-03-02 LAB — LIPID PANEL: HDL: 75.7 mg/dL (ref >39.00)

## 2012-03-10 ENCOUNTER — Other Ambulatory Visit: Payer: Self-pay | Admitting: Family Medicine

## 2012-03-17 ENCOUNTER — Ambulatory Visit (INDEPENDENT_AMBULATORY_CARE_PROVIDER_SITE_OTHER): Payer: Medicare Other | Admitting: Family Medicine

## 2012-03-17 ENCOUNTER — Encounter: Payer: Self-pay | Admitting: Family Medicine

## 2012-03-17 VITALS — BP 104/70 | Temp 98.0°F | Ht 62.75 in | Wt 200.0 lb

## 2012-03-17 DIAGNOSIS — I781 Nevus, non-neoplastic: Secondary | ICD-10-CM

## 2012-03-17 DIAGNOSIS — I10 Essential (primary) hypertension: Secondary | ICD-10-CM | POA: Diagnosis not present

## 2012-03-17 DIAGNOSIS — E785 Hyperlipidemia, unspecified: Secondary | ICD-10-CM | POA: Diagnosis not present

## 2012-03-17 DIAGNOSIS — E039 Hypothyroidism, unspecified: Secondary | ICD-10-CM

## 2012-03-17 DIAGNOSIS — Z23 Encounter for immunization: Secondary | ICD-10-CM

## 2012-03-17 DIAGNOSIS — M1990 Unspecified osteoarthritis, unspecified site: Secondary | ICD-10-CM | POA: Diagnosis not present

## 2012-03-17 DIAGNOSIS — J45901 Unspecified asthma with (acute) exacerbation: Secondary | ICD-10-CM

## 2012-03-17 DIAGNOSIS — J309 Allergic rhinitis, unspecified: Secondary | ICD-10-CM

## 2012-03-17 DIAGNOSIS — M109 Gout, unspecified: Secondary | ICD-10-CM

## 2012-03-17 MED ORDER — ALLOPURINOL 100 MG PO TABS: 100.0000 mg | ORAL_TABLET | Freq: Two times a day (BID) | ORAL | Status: DC

## 2012-03-17 MED ORDER — BECLOMETHASONE DIPROPIONATE 40 MCG/ACT IN AERS: 1.0000 | INHALATION_SPRAY | Freq: Two times a day (BID) | RESPIRATORY_TRACT | Status: DC

## 2012-03-17 MED ORDER — AMLODIPINE BESYLATE-VALSARTAN 10-160 MG PO TABS: 1.0000 | ORAL_TABLET | Freq: Every day | ORAL | Status: DC

## 2012-03-17 MED ORDER — LEVOTHYROXINE SODIUM 50 MCG PO TABS: 50.0000 ug | ORAL_TABLET | Freq: Every day | ORAL | Status: DC

## 2012-03-17 MED ORDER — SIMVASTATIN 20 MG PO TABS: 20.0000 mg | ORAL_TABLET | Freq: Every day | ORAL | Status: DC

## 2012-03-17 MED ORDER — LEVOTHYROXINE SODIUM 50 MCG PO TABS: ORAL_TABLET | ORAL | Status: DC

## 2012-03-17 MED ORDER — ALLOPURINOL 100 MG PO TABS: 100.0000 mg | ORAL_TABLET | Freq: Every day | ORAL | Status: DC

## 2012-03-17 NOTE — Progress Notes (Signed)
  Subjective:    Patient ID: [name], female    DOB: 04-Dec-1933, 78 y.o.   MRN: [id number]  HPI [name] is a delightful 78 year old married female nonsmoker who comes in today for a Medicare wellness examination  She has a history of a TAH and BSO for ovarian cancer, gallops, allergic rhinitis, hypertension, renal insufficiency, hypothyroidism, menopausal symptoms, asthma, hyperlipidemia.  Her medications the list was reviewed and there have been no changes. She sees her nephrologist once or twice a year because of the renal insufficiency renal function is stable  She gets routine eye care, dental care, colonoscopy, tetanus 2007, Pneumovax x2, shingles 2009, flu shot today  Cognitive function normal she goes to gym twice weekly home health safety reviewed no issues identified, no guns in the house, she does have a health care power of attorney and living well   Review of Systems  Constitutional: Negative.   HENT: Negative.   Eyes: Negative.   Respiratory: Negative.   Cardiovascular: Negative.   Gastrointestinal: Negative.   Genitourinary: Negative.   Musculoskeletal: Negative.   Neurological: Negative.   Hematological: Negative.   Psychiatric/Behavioral: Negative.        Objective:   Physical Exam  Constitutional: She appears well-developed and well-nourished.       Obese  HENT:  Head: Normocephalic and atraumatic.  Right Ear: External ear normal.  Left Ear: External ear normal.  Nose: Nose normal.  Mouth/Throat: Oropharynx is clear and moist.  Eyes: EOM are normal. Pupils are equal, round, and reactive to light.  Neck: Normal range of motion. Neck supple. No thyromegaly present.  Cardiovascular: Normal rate, regular rhythm, normal heart sounds and intact distal pulses.  Exam reveals no gallop and no friction rub.   No murmur heard. Pulmonary/Chest: Effort normal and breath sounds normal.  Abdominal: Soft. Bowel sounds are normal. She exhibits no distension and no  mass. There is no tenderness. There is no rebound.  Genitourinary: Vagina normal. Guaiac negative stool. No vaginal discharge found.        Bilateral breast exam normal  Musculoskeletal: Normal range of motion.  Lymphadenopathy:    She has no cervical adenopathy.  Neurological: She is alert. She has normal reflexes. No cranial nerve deficit. She exhibits normal muscle tone. Coordination normal.  Skin: Skin is warm and dry.  Psychiatric: She has a normal mood and affect. Her behavior is normal. Judgment and thought content normal.          Assessment & Plan:  Obesity again encouraged diet exercise and weight loss  History of gout continue allopurinol 200 mg daily  Allergic rhinitis continue plain Zyrtec daily  Hypertension continue X./10-60 daily and Lasix 40 mg daily and potassium 20 mEq daily  Hypothyroidism continue Synthroid 50 alternating with 75 mcg every other day  Postmenopausal with severe hot flashes continue Premarin 0.3 Monday and Friday  Asthma chronic continue Qvar 42 puffs twice a day  Hyperlipidemia Zocor 20 mg each bedtime  Status post TAH and BSO for ovarian cancer years ago asymptomatic

## 2012-03-17 NOTE — Patient Instructions (Signed)
Continue your current medications  Exercise 30 minutes daily  Return in one year sooner if any problems

## 2012-04-14 DIAGNOSIS — E119 Type 2 diabetes mellitus without complications: Secondary | ICD-10-CM | POA: Diagnosis not present

## 2012-04-14 DIAGNOSIS — Z961 Presence of intraocular lens: Secondary | ICD-10-CM | POA: Diagnosis not present

## 2012-04-14 DIAGNOSIS — H2640 Unspecified secondary cataract: Secondary | ICD-10-CM | POA: Diagnosis not present

## 2012-04-14 DIAGNOSIS — H16109 Unspecified superficial keratitis, unspecified eye: Secondary | ICD-10-CM | POA: Diagnosis not present

## 2012-05-26 DIAGNOSIS — H2640 Unspecified secondary cataract: Secondary | ICD-10-CM | POA: Diagnosis not present

## 2012-05-26 DIAGNOSIS — H26499 Other secondary cataract, unspecified eye: Secondary | ICD-10-CM | POA: Diagnosis not present

## 2012-06-13 DIAGNOSIS — E213 Hyperparathyroidism, unspecified: Secondary | ICD-10-CM | POA: Diagnosis not present

## 2012-06-13 DIAGNOSIS — D649 Anemia, unspecified: Secondary | ICD-10-CM | POA: Diagnosis not present

## 2012-06-13 DIAGNOSIS — N184 Chronic kidney disease, stage 4 (severe): Secondary | ICD-10-CM | POA: Diagnosis not present

## 2012-06-13 DIAGNOSIS — M109 Gout, unspecified: Secondary | ICD-10-CM | POA: Diagnosis not present

## 2012-06-13 DIAGNOSIS — E785 Hyperlipidemia, unspecified: Secondary | ICD-10-CM | POA: Diagnosis not present

## 2012-06-13 DIAGNOSIS — I129 Hypertensive chronic kidney disease with stage 1 through stage 4 chronic kidney disease, or unspecified chronic kidney disease: Secondary | ICD-10-CM | POA: Diagnosis not present

## 2012-06-13 DIAGNOSIS — I10 Essential (primary) hypertension: Secondary | ICD-10-CM | POA: Diagnosis not present

## 2012-06-13 DIAGNOSIS — N390 Urinary tract infection, site not specified: Secondary | ICD-10-CM | POA: Diagnosis not present

## 2012-06-13 DIAGNOSIS — N2581 Secondary hyperparathyroidism of renal origin: Secondary | ICD-10-CM | POA: Diagnosis not present

## 2012-06-13 DIAGNOSIS — E669 Obesity, unspecified: Secondary | ICD-10-CM | POA: Diagnosis not present

## 2012-07-13 DIAGNOSIS — N390 Urinary tract infection, site not specified: Secondary | ICD-10-CM | POA: Diagnosis not present

## 2012-10-17 DIAGNOSIS — I10 Essential (primary) hypertension: Secondary | ICD-10-CM | POA: Diagnosis not present

## 2012-10-17 DIAGNOSIS — N184 Chronic kidney disease, stage 4 (severe): Secondary | ICD-10-CM | POA: Diagnosis not present

## 2012-10-17 DIAGNOSIS — D649 Anemia, unspecified: Secondary | ICD-10-CM | POA: Diagnosis not present

## 2012-10-17 DIAGNOSIS — N390 Urinary tract infection, site not specified: Secondary | ICD-10-CM | POA: Diagnosis not present

## 2012-10-17 DIAGNOSIS — E669 Obesity, unspecified: Secondary | ICD-10-CM | POA: Diagnosis not present

## 2012-10-17 DIAGNOSIS — E213 Hyperparathyroidism, unspecified: Secondary | ICD-10-CM | POA: Diagnosis not present

## 2012-10-17 DIAGNOSIS — I129 Hypertensive chronic kidney disease with stage 1 through stage 4 chronic kidney disease, or unspecified chronic kidney disease: Secondary | ICD-10-CM | POA: Diagnosis not present

## 2012-11-10 DIAGNOSIS — N390 Urinary tract infection, site not specified: Secondary | ICD-10-CM | POA: Diagnosis not present

## 2012-12-12 DIAGNOSIS — N390 Urinary tract infection, site not specified: Secondary | ICD-10-CM | POA: Diagnosis not present

## 2013-01-04 ENCOUNTER — Other Ambulatory Visit: Payer: Self-pay

## 2013-01-04 DIAGNOSIS — Z1231 Encounter for screening mammogram for malignant neoplasm of breast: Secondary | ICD-10-CM

## 2013-02-01 DIAGNOSIS — I129 Hypertensive chronic kidney disease with stage 1 through stage 4 chronic kidney disease, or unspecified chronic kidney disease: Secondary | ICD-10-CM | POA: Diagnosis not present

## 2013-02-01 DIAGNOSIS — I10 Essential (primary) hypertension: Secondary | ICD-10-CM | POA: Diagnosis not present

## 2013-02-01 DIAGNOSIS — N390 Urinary tract infection, site not specified: Secondary | ICD-10-CM | POA: Diagnosis not present

## 2013-02-13 ENCOUNTER — Ambulatory Visit
Admission: RE | Admit: 2013-02-13 | Discharge: 2013-02-13 | Disposition: A | Discharge status: Home / Self Care | Payer: Medicare Other | Source: Ambulatory Visit

## 2013-02-13 DIAGNOSIS — Z1231 Encounter for screening mammogram for malignant neoplasm of breast: Secondary | ICD-10-CM | POA: Diagnosis not present

## 2013-02-14 ENCOUNTER — Other Ambulatory Visit: Payer: Self-pay | Admitting: Family Medicine

## 2013-02-14 DIAGNOSIS — R928 Other abnormal and inconclusive findings on diagnostic imaging of breast: Secondary | ICD-10-CM

## 2013-02-17 ENCOUNTER — Ambulatory Visit
Admission: RE | Admit: 2013-02-17 | Discharge: 2013-02-17 | Disposition: A | Discharge status: Home / Self Care | Payer: Medicare Other | Source: Ambulatory Visit | Attending: Family Medicine | Admitting: Family Medicine

## 2013-02-17 DIAGNOSIS — R928 Other abnormal and inconclusive findings on diagnostic imaging of breast: Secondary | ICD-10-CM

## 2013-02-17 DIAGNOSIS — N6009 Solitary cyst of unspecified breast: Secondary | ICD-10-CM | POA: Diagnosis not present

## 2013-02-21 ENCOUNTER — Other Ambulatory Visit: Payer: Self-pay | Admitting: Family Medicine

## 2013-03-02 ENCOUNTER — Other Ambulatory Visit: Payer: Self-pay | Admitting: *Deleted

## 2013-03-02 MED ORDER — POTASSIUM CHLORIDE CRYS ER 20 MEQ PO TBCR: EXTENDED_RELEASE_TABLET | ORAL | Status: DC

## 2013-03-08 ENCOUNTER — Other Ambulatory Visit: Payer: Medicare Other

## 2013-03-09 ENCOUNTER — Other Ambulatory Visit: Payer: Self-pay | Admitting: Family Medicine

## 2013-03-15 ENCOUNTER — Other Ambulatory Visit: Payer: Medicare Other

## 2013-03-22 ENCOUNTER — Encounter: Payer: Medicare Other | Admitting: Family Medicine

## 2013-04-10 DIAGNOSIS — Z23 Encounter for immunization: Secondary | ICD-10-CM | POA: Diagnosis not present

## 2013-04-10 DIAGNOSIS — I10 Essential (primary) hypertension: Secondary | ICD-10-CM | POA: Diagnosis not present

## 2013-04-10 DIAGNOSIS — I129 Hypertensive chronic kidney disease with stage 1 through stage 4 chronic kidney disease, or unspecified chronic kidney disease: Secondary | ICD-10-CM | POA: Diagnosis not present

## 2013-04-10 DIAGNOSIS — D649 Anemia, unspecified: Secondary | ICD-10-CM | POA: Diagnosis not present

## 2013-04-10 DIAGNOSIS — M109 Gout, unspecified: Secondary | ICD-10-CM | POA: Diagnosis not present

## 2013-04-10 DIAGNOSIS — N2581 Secondary hyperparathyroidism of renal origin: Secondary | ICD-10-CM | POA: Diagnosis not present

## 2013-04-10 DIAGNOSIS — N184 Chronic kidney disease, stage 4 (severe): Secondary | ICD-10-CM | POA: Diagnosis not present

## 2013-04-10 DIAGNOSIS — N390 Urinary tract infection, site not specified: Secondary | ICD-10-CM | POA: Diagnosis not present

## 2013-05-09 ENCOUNTER — Other Ambulatory Visit (INDEPENDENT_AMBULATORY_CARE_PROVIDER_SITE_OTHER): Payer: Medicare Other

## 2013-05-09 DIAGNOSIS — E785 Hyperlipidemia, unspecified: Secondary | ICD-10-CM | POA: Diagnosis not present

## 2013-05-09 DIAGNOSIS — E039 Hypothyroidism, unspecified: Secondary | ICD-10-CM

## 2013-05-09 DIAGNOSIS — I10 Essential (primary) hypertension: Secondary | ICD-10-CM

## 2013-05-09 LAB — CBC WITH DIFFERENTIAL/PLATELET
Basophils Absolute: 0 10*3/uL (ref 0.0–0.1)
Basophils Relative: 0.4 % (ref 0.0–3.0)
Eosinophils Relative: 6.7 % — ABNORMAL HIGH (ref 0.0–5.0)
Hemoglobin: 12 g/dL (ref 12.0–15.0)
Lymphocytes Relative: 26.5 % (ref 12.0–46.0)
Monocytes Relative: 8.7 % (ref 3.0–12.0)
Neutro Abs: 4.7 10*3/uL (ref 1.4–7.7)
RBC: 3.98 Mil/uL (ref 3.87–5.11)
RDW: 15.5 % — ABNORMAL HIGH (ref 11.5–14.6)
WBC: 8.1 10*3/uL (ref 4.5–10.5)

## 2013-05-09 LAB — HEPATIC FUNCTION PANEL
AST: 22 U/L (ref 0–37)
Albumin: 4.5 g/dL (ref 3.5–5.2)
Alkaline Phosphatase: 70 U/L (ref 39–117)

## 2013-05-09 LAB — BASIC METABOLIC PANEL
Calcium: 10.1 mg/dL (ref 8.4–10.5)
GFR: 31.35 mL/min — ABNORMAL LOW (ref >60.00)
Sodium: 136 mEq/L (ref 135–145)

## 2013-05-09 LAB — POCT URINALYSIS DIPSTICK
Blood, UA: NEGATIVE
Glucose, UA: NEGATIVE
Spec Grav, UA: 1.01
Urobilinogen, UA: 0.2

## 2013-05-09 LAB — LIPID PANEL
HDL: 68.3 mg/dL (ref >39.00)
LDL Cholesterol: 95 mg/dL (ref 0–99)
Total CHOL/HDL Ratio: 3
Triglycerides: 147 mg/dL (ref 0.0–149.0)

## 2013-05-16 ENCOUNTER — Other Ambulatory Visit: Payer: Self-pay | Admitting: Family Medicine

## 2013-05-18 ENCOUNTER — Encounter: Payer: Self-pay | Admitting: Family Medicine

## 2013-05-18 ENCOUNTER — Ambulatory Visit (INDEPENDENT_AMBULATORY_CARE_PROVIDER_SITE_OTHER): Payer: Medicare Other | Admitting: Family Medicine

## 2013-05-18 VITALS — BP 110/70 | Temp 97.6°F | Ht 63.0 in | Wt 194.0 lb

## 2013-05-18 DIAGNOSIS — I10 Essential (primary) hypertension: Secondary | ICD-10-CM | POA: Diagnosis not present

## 2013-05-18 DIAGNOSIS — E785 Hyperlipidemia, unspecified: Secondary | ICD-10-CM | POA: Diagnosis not present

## 2013-05-18 DIAGNOSIS — Z0000 Encounter for general adult medical examination without abnormal findings: Secondary | ICD-10-CM | POA: Diagnosis not present

## 2013-05-18 DIAGNOSIS — M1990 Unspecified osteoarthritis, unspecified site: Secondary | ICD-10-CM | POA: Diagnosis not present

## 2013-05-18 DIAGNOSIS — E039 Hypothyroidism, unspecified: Secondary | ICD-10-CM

## 2013-05-18 DIAGNOSIS — J309 Allergic rhinitis, unspecified: Secondary | ICD-10-CM | POA: Diagnosis not present

## 2013-05-18 MED ORDER — POTASSIUM CHLORIDE CRYS ER 20 MEQ PO TBCR: EXTENDED_RELEASE_TABLET | ORAL | Status: DC

## 2013-05-18 MED ORDER — FUROSEMIDE 40 MG PO TABS: ORAL_TABLET | ORAL | Status: DC

## 2013-05-18 MED ORDER — SIMVASTATIN 20 MG PO TABS: 20.0000 mg | ORAL_TABLET | Freq: Every day | ORAL | Status: DC

## 2013-05-18 MED ORDER — ALLOPURINOL 100 MG PO TABS: ORAL_TABLET | ORAL | Status: DC

## 2013-05-18 MED ORDER — LEVOTHYROXINE SODIUM 50 MCG PO TABS: 50.0000 ug | ORAL_TABLET | Freq: Every day | ORAL | Status: DC

## 2013-05-18 MED ORDER — AMLODIPINE BESYLATE 5 MG PO TABS: 5.0000 mg | ORAL_TABLET | Freq: Every day | ORAL | Status: DC

## 2013-05-18 MED ORDER — ESTROGENS CONJUGATED 0.3 MG PO TABS: ORAL_TABLET | ORAL | Status: DC

## 2013-05-18 MED ORDER — VALSARTAN 160 MG PO TABS: 160.0000 mg | ORAL_TABLET | Freq: Every day | ORAL | Status: DC

## 2013-05-18 NOTE — Patient Instructions (Addendum)
Continue your current medications  We will send a copy of your most recent lab work to Dr. Keturah Barre.,,,,,,,, your nephrologist  Followup with me in one year sooner if any problems  Increase your water intake,,,,,,,,,,,,,, drink 38 ounce glasses a day  Consider joining the [facility].......... in beginning a water aerobics exercise program

## 2013-05-18 NOTE — Progress Notes (Signed)
  Subjective:    Patient ID: [name], female    DOB: 1933-07-26, 79 y.o.   MRN: [id number]  HPI [name] is a 79 year old recently widowed,,,,,,,, her husband [name] died in Sep 30, 2022 from prostate cancer,,,,, who comes in today for a Medicare wellness examination  Her medications reviewed in detail and there've been no changes except that her insurance will no longer cover exforge....... which is a combination of 5 mg of Norvasc and 160 mg of an ACE inhibitor  She gets routine eye care, dental care, BSE sporadically, and you mammography, colonoscopy within 10 years normal.  She had her uterus and ovaries removed many years ago for nonmalignant reasons she takes Premarin 0.31 tablet 2022-09-30 and Thursday for severe hot flashes.  She remains in the home where she can stand lift. She has help cleaning. She's unable to walk for a month because of her back and hips. I [facility]  Cognitive function normal she does not exercise on a regular basis home health safety reviewed no issues identified, no guns in the house, she does have a health care power of attorney and living well  Vaccinations up-to-date   Review of Systems  Constitutional: Negative.   HENT: Negative.   Eyes: Negative.   Respiratory: Negative.   Cardiovascular: Negative.   Gastrointestinal: Negative.   Endocrine: Negative.   Genitourinary: Negative.   Musculoskeletal: Negative.   Allergic/Immunologic: Negative.   Neurological: Negative.   Hematological: Negative.   Psychiatric/Behavioral: Negative.        Objective:   Physical Exam  Nursing note and vitals reviewed. Constitutional: She appears well-developed and well-nourished.  HENT:  Head: Normocephalic and atraumatic.  Right Ear: External ear normal.  Left Ear: External ear normal.  Nose: Nose normal.  Mouth/Throat: Oropharynx is clear and moist.  Eyes: EOM are normal. Pupils are equal, round, and reactive to light.   Neck: Normal range of motion. Neck supple. No thyromegaly present.  Cardiovascular: Normal rate, regular rhythm, normal heart sounds and intact distal pulses.  Exam reveals no gallop and no friction rub.   No murmur heard. Pulmonary/Chest: Effort normal and breath sounds normal. No respiratory distress. She has no wheezes. She has no rales. She exhibits no tenderness.  Abdominal: Soft. Bowel sounds are normal. She exhibits no distension and no mass. There is no tenderness. There is no rebound.  Genitourinary:  Bilateral breast exam normal  Musculoskeletal: Normal range of motion.  Scar right knee from previous total knee replacement  Lymphadenopathy:    She has no cervical adenopathy.  Neurological: She is alert. She has normal reflexes. No cranial nerve deficit. She exhibits normal muscle tone. Coordination normal.  Skin: Skin is warm and dry.  Psychiatric: She has a normal mood and affect. Her behavior is normal. Judgment and thought content normal.          Assessment & Plan:  Post menopausal hot flashes continue Premarin 0.3 once or twice weekly  Hypothyroidism continue Synthroid  Hyperlipidemia continue simvastatin aspirin  Hypertension we will split the medication that Dr. Philippa Sicks gave her into 2 individual components since the insurance no longer covers a combination drug   Osteoarthritis recommend exercise and weight loss  Renal insufficiency secondary to long-standing hypertension

## 2013-05-26 ENCOUNTER — Other Ambulatory Visit: Payer: Self-pay | Admitting: Family Medicine

## 2013-06-23 DIAGNOSIS — H35419 Lattice degeneration of retina, unspecified eye: Secondary | ICD-10-CM | POA: Diagnosis not present

## 2013-06-23 DIAGNOSIS — E119 Type 2 diabetes mellitus without complications: Secondary | ICD-10-CM | POA: Diagnosis not present

## 2013-06-23 DIAGNOSIS — H04129 Dry eye syndrome of unspecified lacrimal gland: Secondary | ICD-10-CM | POA: Diagnosis not present

## 2013-06-23 DIAGNOSIS — Z961 Presence of intraocular lens: Secondary | ICD-10-CM | POA: Diagnosis not present

## 2013-07-05 ENCOUNTER — Encounter: Payer: Self-pay | Admitting: Family Medicine

## 2013-08-09 DIAGNOSIS — N039 Chronic nephritic syndrome with unspecified morphologic changes: Secondary | ICD-10-CM | POA: Diagnosis not present

## 2013-08-09 DIAGNOSIS — D631 Anemia in chronic kidney disease: Secondary | ICD-10-CM | POA: Diagnosis not present

## 2013-08-09 DIAGNOSIS — I129 Hypertensive chronic kidney disease with stage 1 through stage 4 chronic kidney disease, or unspecified chronic kidney disease: Secondary | ICD-10-CM | POA: Diagnosis not present

## 2013-08-09 DIAGNOSIS — N2581 Secondary hyperparathyroidism of renal origin: Secondary | ICD-10-CM | POA: Diagnosis not present

## 2013-08-09 DIAGNOSIS — R809 Proteinuria, unspecified: Secondary | ICD-10-CM | POA: Diagnosis not present

## 2013-08-09 DIAGNOSIS — N184 Chronic kidney disease, stage 4 (severe): Secondary | ICD-10-CM | POA: Diagnosis not present

## 2013-11-07 DIAGNOSIS — N039 Chronic nephritic syndrome with unspecified morphologic changes: Secondary | ICD-10-CM | POA: Diagnosis not present

## 2013-11-07 DIAGNOSIS — E785 Hyperlipidemia, unspecified: Secondary | ICD-10-CM | POA: Diagnosis not present

## 2013-11-07 DIAGNOSIS — R809 Proteinuria, unspecified: Secondary | ICD-10-CM | POA: Diagnosis not present

## 2013-11-07 DIAGNOSIS — D631 Anemia in chronic kidney disease: Secondary | ICD-10-CM | POA: Diagnosis not present

## 2013-11-07 DIAGNOSIS — N184 Chronic kidney disease, stage 4 (severe): Secondary | ICD-10-CM | POA: Diagnosis not present

## 2013-11-07 DIAGNOSIS — I129 Hypertensive chronic kidney disease with stage 1 through stage 4 chronic kidney disease, or unspecified chronic kidney disease: Secondary | ICD-10-CM | POA: Diagnosis not present

## 2013-11-13 DIAGNOSIS — N390 Urinary tract infection, site not specified: Secondary | ICD-10-CM | POA: Diagnosis not present

## 2013-12-07 DIAGNOSIS — N390 Urinary tract infection, site not specified: Secondary | ICD-10-CM | POA: Diagnosis not present

## 2014-01-03 DIAGNOSIS — N390 Urinary tract infection, site not specified: Secondary | ICD-10-CM | POA: Diagnosis not present

## 2014-02-16 ENCOUNTER — Other Ambulatory Visit: Payer: Self-pay

## 2014-02-16 DIAGNOSIS — Z1231 Encounter for screening mammogram for malignant neoplasm of breast: Secondary | ICD-10-CM

## 2014-02-23 ENCOUNTER — Encounter (INDEPENDENT_AMBULATORY_CARE_PROVIDER_SITE_OTHER): Payer: Self-pay

## 2014-02-23 ENCOUNTER — Ambulatory Visit
Admission: RE | Admit: 2014-02-23 | Discharge: 2014-02-23 | Disposition: A | Discharge status: Home / Self Care | Payer: Medicare Other | Source: Ambulatory Visit

## 2014-02-23 DIAGNOSIS — Z1231 Encounter for screening mammogram for malignant neoplasm of breast: Secondary | ICD-10-CM

## 2014-03-21 DIAGNOSIS — R809 Proteinuria, unspecified: Secondary | ICD-10-CM | POA: Diagnosis not present

## 2014-03-21 DIAGNOSIS — E785 Hyperlipidemia, unspecified: Secondary | ICD-10-CM | POA: Diagnosis not present

## 2014-03-21 DIAGNOSIS — I129 Hypertensive chronic kidney disease with stage 1 through stage 4 chronic kidney disease, or unspecified chronic kidney disease: Secondary | ICD-10-CM | POA: Diagnosis not present

## 2014-03-21 DIAGNOSIS — Z23 Encounter for immunization: Secondary | ICD-10-CM | POA: Diagnosis not present

## 2014-03-21 DIAGNOSIS — N184 Chronic kidney disease, stage 4 (severe): Secondary | ICD-10-CM | POA: Diagnosis not present

## 2014-05-09 DIAGNOSIS — N184 Chronic kidney disease, stage 4 (severe): Secondary | ICD-10-CM | POA: Diagnosis not present

## 2014-05-14 ENCOUNTER — Other Ambulatory Visit (INDEPENDENT_AMBULATORY_CARE_PROVIDER_SITE_OTHER): Payer: Medicare Other

## 2014-05-14 DIAGNOSIS — Z0000 Encounter for general adult medical examination without abnormal findings: Secondary | ICD-10-CM | POA: Diagnosis not present

## 2014-05-14 DIAGNOSIS — E785 Hyperlipidemia, unspecified: Secondary | ICD-10-CM | POA: Diagnosis not present

## 2014-05-14 LAB — POCT URINALYSIS DIPSTICK
BILIRUBIN UA: NEGATIVE
GLUCOSE UA: NEGATIVE
Ketones, UA: NEGATIVE
Leukocytes, UA: NEGATIVE
Nitrite, UA: NEGATIVE
Protein, UA: NEGATIVE
RBC UA: NEGATIVE
Urobilinogen, UA: 0.2
pH, UA: 5.5

## 2014-05-14 LAB — BASIC METABOLIC PANEL
BUN: 60 mg/dL — ABNORMAL HIGH (ref 6–23)
CALCIUM: 9.9 mg/dL (ref 8.4–10.5)
CO2: 18 mEq/L — ABNORMAL LOW (ref 19–32)
CREATININE: 1.9 mg/dL — AB (ref 0.4–1.2)
Chloride: 106 mEq/L (ref 96–112)
GFR: 27.8 mL/min — AB (ref >60.00)
Glucose, Bld: 102 mg/dL — ABNORMAL HIGH (ref 70–99)
Potassium: 3.8 mEq/L (ref 3.5–5.1)
Sodium: 138 mEq/L (ref 135–145)

## 2014-05-14 LAB — CBC WITH DIFFERENTIAL/PLATELET
BASOS ABS: 0 10*3/uL (ref 0.0–0.1)
BASOS PCT: 0.4 % (ref 0.0–3.0)
Eosinophils Absolute: 0.7 10*3/uL (ref 0.0–0.7)
Eosinophils Relative: 6.7 % — ABNORMAL HIGH (ref 0.0–5.0)
HCT: 37.7 % (ref 36.0–46.0)
Hemoglobin: 12.1 g/dL (ref 12.0–15.0)
LYMPHS PCT: 25.7 % (ref 12.0–46.0)
Lymphs Abs: 2.5 10*3/uL (ref 0.7–4.0)
MCHC: 32.1 g/dL (ref 30.0–36.0)
MCV: 92.2 fl (ref 78.0–100.0)
MONOS PCT: 8.5 % (ref 3.0–12.0)
Monocytes Absolute: 0.8 10*3/uL (ref 0.1–1.0)
NEUTROS PCT: 58.7 % (ref 43.0–77.0)
Neutro Abs: 5.7 10*3/uL (ref 1.4–7.7)
Platelets: 253 10*3/uL (ref 150.0–400.0)
RBC: 4.09 Mil/uL (ref 3.87–5.11)
RDW: 15.7 % — ABNORMAL HIGH (ref 11.5–15.5)
WBC: 9.8 10*3/uL (ref 4.0–10.5)

## 2014-05-14 LAB — TSH: TSH: 3.13 u[IU]/mL (ref 0.35–4.50)

## 2014-05-14 LAB — LIPID PANEL
CHOL/HDL RATIO: 3
Cholesterol: 186 mg/dL (ref 0–200)
HDL: 66.9 mg/dL (ref >39.00)
LDL Cholesterol: 93 mg/dL (ref 0–99)
NonHDL: 119.1
TRIGLYCERIDES: 132 mg/dL (ref 0.0–149.0)
VLDL: 26.4 mg/dL (ref 0.0–40.0)

## 2014-05-14 LAB — HEPATIC FUNCTION PANEL
ALT: 17 U/L (ref 0–35)
AST: 21 U/L (ref 0–37)
Albumin: 3.9 g/dL (ref 3.5–5.2)
Alkaline Phosphatase: 61 U/L (ref 39–117)
Bilirubin, Direct: 0.1 mg/dL (ref 0.0–0.3)
Total Bilirubin: 0.5 mg/dL (ref 0.2–1.2)
Total Protein: 7.3 g/dL (ref 6.0–8.3)

## 2014-05-21 ENCOUNTER — Encounter: Payer: Self-pay | Admitting: Family Medicine

## 2014-05-21 ENCOUNTER — Ambulatory Visit (INDEPENDENT_AMBULATORY_CARE_PROVIDER_SITE_OTHER): Payer: Medicare Other | Admitting: Family Medicine

## 2014-05-21 VITALS — BP 110/70 | Temp 98.1°F | Ht 62.5 in | Wt 192.0 lb

## 2014-05-21 DIAGNOSIS — M1990 Unspecified osteoarthritis, unspecified site: Secondary | ICD-10-CM | POA: Diagnosis not present

## 2014-05-21 DIAGNOSIS — I10 Essential (primary) hypertension: Secondary | ICD-10-CM | POA: Diagnosis not present

## 2014-05-21 DIAGNOSIS — M1A39X0 Chronic gout due to renal impairment, multiple sites, without tophus (tophi): Secondary | ICD-10-CM | POA: Diagnosis not present

## 2014-05-21 DIAGNOSIS — Z23 Encounter for immunization: Secondary | ICD-10-CM | POA: Diagnosis not present

## 2014-05-21 DIAGNOSIS — E785 Hyperlipidemia, unspecified: Secondary | ICD-10-CM

## 2014-05-21 DIAGNOSIS — J309 Allergic rhinitis, unspecified: Secondary | ICD-10-CM

## 2014-05-21 DIAGNOSIS — E039 Hypothyroidism, unspecified: Secondary | ICD-10-CM | POA: Diagnosis not present

## 2014-05-21 MED ORDER — FUROSEMIDE 40 MG PO TABS: ORAL_TABLET | ORAL | Status: DC

## 2014-05-21 MED ORDER — AMLODIPINE BESYLATE-VALSARTAN 10-160 MG PO TABS: 1.0000 | ORAL_TABLET | Freq: Every day | ORAL | Status: DC

## 2014-05-21 MED ORDER — POTASSIUM CHLORIDE CRYS ER 20 MEQ PO TBCR: EXTENDED_RELEASE_TABLET | ORAL | Status: DC

## 2014-05-21 MED ORDER — ALLOPURINOL 100 MG PO TABS: ORAL_TABLET | ORAL | Status: DC

## 2014-05-21 MED ORDER — SIMVASTATIN 20 MG PO TABS: ORAL_TABLET | ORAL | Status: DC

## 2014-05-21 MED ORDER — LEVOTHYROXINE SODIUM 50 MCG PO TABS: ORAL_TABLET | ORAL | Status: DC

## 2014-05-21 NOTE — Progress Notes (Signed)
Pre visit review using our clinic review tool, if applicable. No additional management support is needed unless otherwise documented below in the visit note. 

## 2014-05-21 NOTE — Progress Notes (Signed)
   Subjective:    Patient ID: [name], female    DOB: 06-May-1934, 80 y.o.   MRN: [id number]  HPI [name] is a 80 year old widowed female,,,,,,,,,,,,,,,, her husband [name] died a year ago from complications of prostate cancer,,,,,, who comes in today for evaluation of gout..... Asymptomatic on allopurinol 100 mg daily.......... Chronic renal insufficiency followed by Dr. Donley Redder and nephrology, postmenopausal vaginal dryness hot sweats on Premarin 0.3 twice weekly, hypothyroidism, and hyperlipidemia  She gets routine eye care, dental care, BSE monthly, and you mammography, last colonoscopy was 1993 she's due for a follow-up. Actually she is overdue and she knows it. She denies any GI symptoms  Vaccinations updated by [facility] list correct  Cognitive function normal she does not exercise on a regular basis home health safety reviewed no issues identified, no guns in the house, she does have a healthcare power of attorney and living well,   Review of Systems  Constitutional: Negative.   HENT: Negative.   Eyes: Negative.   Respiratory: Negative.   Cardiovascular: Negative.   Gastrointestinal: Negative.   Endocrine: Negative.   Genitourinary: Negative.   Musculoskeletal: Negative.   Skin: Negative.   Allergic/Immunologic: Negative.   Neurological: Negative.   Hematological: Negative.   Psychiatric/Behavioral: Negative.        Objective:   Physical Exam  Constitutional: She appears well-developed and well-nourished.  HENT:  Head: Normocephalic and atraumatic.  Right Ear: External ear normal.  Left Ear: External ear normal.  Nose: Nose normal.  Mouth/Throat: Oropharynx is clear and moist.  Eyes: EOM are normal. Pupils are equal, round, and reactive to light.  Neck: Normal range of motion. Neck supple. No JVD present. No tracheal deviation present. No thyromegaly present.  Cardiovascular: Normal rate, regular rhythm, normal heart sounds and intact distal pulses.  Exam reveals  no gallop and no friction rub.   No murmur heard. Pulmonary/Chest: Effort normal and breath sounds normal. No stridor. No respiratory distress. She has no wheezes. She has no rales. She exhibits no tenderness.  Abdominal: Soft. Bowel sounds are normal. She exhibits no distension and no mass. There is no tenderness. There is no rebound and no guarding.  Genitourinary:  Bilateral breast exam normal  Pelvic deferred asymptomatic  Rectal deferred...Marland KitchenMarland KitchenMarland Kitchen Due for a colonoscopy,,,, asymptomatic  Musculoskeletal: Normal range of motion.  Lymphadenopathy:    She has no cervical adenopathy.  Neurological: She is alert. She has normal reflexes. No cranial nerve deficit. She exhibits normal muscle tone. Coordination normal.  Skin: Skin is warm and dry. No rash noted. No erythema. No pallor.  Psychiatric: She has a normal mood and affect. Her behavior is normal. Judgment and thought content normal.  Nursing note and vitals reviewed.         Assessment & Plan:  History of gout,,,,, asymptomatic on allopurinol 100 mg daily  Hypertension.... Stage III renal disease,,,,,,, continue current medication follow-up with nephrology as outlined every 4 months  Postmenopausal hot flushes,,,,, Premarin 0.3 twice weekly  Hypothyroidism continue Synthroid  Hyperlipidemia continue Zocor Obesity............................................Marland Kitchen Again recommended diet exercise and weight loss

## 2014-05-21 NOTE — Patient Instructions (Signed)
Continue your current medications  Follow-up in 1 year sooner if any problems  Begin a diet and exercise program

## 2014-05-22 ENCOUNTER — Telehealth: Payer: Self-pay | Admitting: Family Medicine

## 2014-05-22 NOTE — Telephone Encounter (Signed)
emmi mailed  °

## 2014-06-07 ENCOUNTER — Other Ambulatory Visit: Payer: Self-pay | Admitting: Family Medicine

## 2014-06-25 DIAGNOSIS — Z961 Presence of intraocular lens: Secondary | ICD-10-CM | POA: Diagnosis not present

## 2014-06-25 DIAGNOSIS — H43813 Vitreous degeneration, bilateral: Secondary | ICD-10-CM | POA: Diagnosis not present

## 2014-06-25 DIAGNOSIS — H5213 Myopia, bilateral: Secondary | ICD-10-CM | POA: Diagnosis not present

## 2014-06-25 DIAGNOSIS — E119 Type 2 diabetes mellitus without complications: Secondary | ICD-10-CM | POA: Diagnosis not present

## 2014-07-18 ENCOUNTER — Emergency Department (HOSPITAL_COMMUNITY): Payer: Medicare Other

## 2014-07-18 ENCOUNTER — Telehealth: Payer: Self-pay | Admitting: Family Medicine

## 2014-07-18 ENCOUNTER — Inpatient Hospital Stay (HOSPITAL_COMMUNITY)
Admission: EM | Admit: 2014-07-18 | Discharge: 2014-07-26 | DRG: 389 | Disposition: A | Discharge status: Home / Self Care | Payer: Medicare Other | Attending: Internal Medicine | Admitting: Internal Medicine

## 2014-07-18 ENCOUNTER — Encounter (HOSPITAL_COMMUNITY): Payer: Self-pay

## 2014-07-18 DIAGNOSIS — K565 Intestinal adhesions [bands] with obstruction (postprocedural) (postinfection): Secondary | ICD-10-CM | POA: Diagnosis present

## 2014-07-18 DIAGNOSIS — E669 Obesity, unspecified: Secondary | ICD-10-CM | POA: Diagnosis present

## 2014-07-18 DIAGNOSIS — Z7982 Long term (current) use of aspirin: Secondary | ICD-10-CM

## 2014-07-18 DIAGNOSIS — K5660 Unspecified intestinal obstruction: Secondary | ICD-10-CM | POA: Diagnosis not present

## 2014-07-18 DIAGNOSIS — Z90710 Acquired absence of both cervix and uterus: Secondary | ICD-10-CM | POA: Diagnosis not present

## 2014-07-18 DIAGNOSIS — R0689 Other abnormalities of breathing: Secondary | ICD-10-CM

## 2014-07-18 DIAGNOSIS — Z8543 Personal history of malignant neoplasm of ovary: Secondary | ICD-10-CM | POA: Diagnosis not present

## 2014-07-18 DIAGNOSIS — I10 Essential (primary) hypertension: Secondary | ICD-10-CM

## 2014-07-18 DIAGNOSIS — K598 Other specified functional intestinal disorders: Secondary | ICD-10-CM | POA: Diagnosis not present

## 2014-07-18 DIAGNOSIS — R197 Diarrhea, unspecified: Secondary | ICD-10-CM | POA: Diagnosis present

## 2014-07-18 DIAGNOSIS — Z9049 Acquired absence of other specified parts of digestive tract: Secondary | ICD-10-CM | POA: Diagnosis present

## 2014-07-18 DIAGNOSIS — I495 Sick sinus syndrome: Secondary | ICD-10-CM | POA: Diagnosis present

## 2014-07-18 DIAGNOSIS — I480 Paroxysmal atrial fibrillation: Secondary | ICD-10-CM | POA: Diagnosis not present

## 2014-07-18 DIAGNOSIS — R1110 Vomiting, unspecified: Secondary | ICD-10-CM | POA: Diagnosis not present

## 2014-07-18 DIAGNOSIS — I4891 Unspecified atrial fibrillation: Secondary | ICD-10-CM | POA: Diagnosis not present

## 2014-07-18 DIAGNOSIS — N183 Chronic kidney disease, stage 3 unspecified: Secondary | ICD-10-CM | POA: Diagnosis present

## 2014-07-18 DIAGNOSIS — K56609 Unspecified intestinal obstruction, unspecified as to partial versus complete obstruction: Secondary | ICD-10-CM

## 2014-07-18 DIAGNOSIS — Z96651 Presence of right artificial knee joint: Secondary | ICD-10-CM | POA: Diagnosis present

## 2014-07-18 DIAGNOSIS — R109 Unspecified abdominal pain: Secondary | ICD-10-CM | POA: Diagnosis not present

## 2014-07-18 DIAGNOSIS — K5669 Other intestinal obstruction: Secondary | ICD-10-CM | POA: Diagnosis not present

## 2014-07-18 DIAGNOSIS — E785 Hyperlipidemia, unspecified: Secondary | ICD-10-CM | POA: Diagnosis present

## 2014-07-18 DIAGNOSIS — N179 Acute kidney failure, unspecified: Secondary | ICD-10-CM | POA: Diagnosis present

## 2014-07-18 DIAGNOSIS — E860 Dehydration: Secondary | ICD-10-CM | POA: Diagnosis not present

## 2014-07-18 DIAGNOSIS — R0600 Dyspnea, unspecified: Secondary | ICD-10-CM | POA: Diagnosis not present

## 2014-07-18 DIAGNOSIS — R935 Abnormal findings on diagnostic imaging of other abdominal regions, including retroperitoneum: Secondary | ICD-10-CM | POA: Diagnosis not present

## 2014-07-18 DIAGNOSIS — I455 Other specified heart block: Secondary | ICD-10-CM

## 2014-07-18 DIAGNOSIS — Z923 Personal history of irradiation: Secondary | ICD-10-CM | POA: Diagnosis not present

## 2014-07-18 DIAGNOSIS — I340 Nonrheumatic mitral (valve) insufficiency: Secondary | ICD-10-CM | POA: Diagnosis present

## 2014-07-18 DIAGNOSIS — I129 Hypertensive chronic kidney disease with stage 1 through stage 4 chronic kidney disease, or unspecified chronic kidney disease: Secondary | ICD-10-CM | POA: Diagnosis present

## 2014-07-18 DIAGNOSIS — R933 Abnormal findings on diagnostic imaging of other parts of digestive tract: Secondary | ICD-10-CM | POA: Diagnosis not present

## 2014-07-18 DIAGNOSIS — E872 Acidosis: Secondary | ICD-10-CM | POA: Diagnosis present

## 2014-07-18 DIAGNOSIS — K6389 Other specified diseases of intestine: Secondary | ICD-10-CM | POA: Diagnosis not present

## 2014-07-18 DIAGNOSIS — M1990 Unspecified osteoarthritis, unspecified site: Secondary | ICD-10-CM | POA: Diagnosis present

## 2014-07-18 DIAGNOSIS — N189 Chronic kidney disease, unspecified: Secondary | ICD-10-CM

## 2014-07-18 DIAGNOSIS — R1030 Lower abdominal pain, unspecified: Secondary | ICD-10-CM | POA: Diagnosis not present

## 2014-07-18 DIAGNOSIS — E871 Hypo-osmolality and hyponatremia: Secondary | ICD-10-CM | POA: Diagnosis present

## 2014-07-18 DIAGNOSIS — R9431 Abnormal electrocardiogram [ECG] [EKG]: Secondary | ICD-10-CM | POA: Diagnosis not present

## 2014-07-18 DIAGNOSIS — R140 Abdominal distension (gaseous): Secondary | ICD-10-CM | POA: Diagnosis not present

## 2014-07-18 LAB — CBC WITH DIFFERENTIAL/PLATELET
Basophils Absolute: 0 10*3/uL (ref 0.0–0.1)
Basophils Relative: 0 % (ref 0–1)
EOS ABS: 0 10*3/uL (ref 0.0–0.7)
Eosinophils Relative: 0 % (ref 0–5)
HCT: 44.7 % (ref 36.0–46.0)
HEMOGLOBIN: 15.1 g/dL — AB (ref 12.0–15.0)
LYMPHS ABS: 1.6 10*3/uL (ref 0.7–4.0)
Lymphocytes Relative: 14 % (ref 12–46)
MCH: 30.5 pg (ref 26.0–34.0)
MCHC: 33.8 g/dL (ref 30.0–36.0)
MCV: 90.3 fL (ref 78.0–100.0)
MONO ABS: 1 10*3/uL (ref 0.1–1.0)
MONOS PCT: 8 % (ref 3–12)
NEUTROS PCT: 78 % — AB (ref 43–77)
Neutro Abs: 9.5 10*3/uL — ABNORMAL HIGH (ref 1.7–7.7)
Platelets: 278 10*3/uL (ref 150–400)
RBC: 4.95 MIL/uL (ref 3.87–5.11)
RDW: 14.6 % (ref 11.5–15.5)
WBC: 12.1 10*3/uL — ABNORMAL HIGH (ref 4.0–10.5)

## 2014-07-18 LAB — COMPREHENSIVE METABOLIC PANEL
ALT: 13 U/L (ref 0–35)
AST: 23 U/L (ref 0–37)
Albumin: 4.9 g/dL (ref 3.5–5.2)
Alkaline Phosphatase: 76 U/L (ref 39–117)
Anion gap: 13 (ref 5–15)
BUN: 91 mg/dL — ABNORMAL HIGH (ref 6–23)
CO2: 26 mmol/L (ref 19–32)
CREATININE: 2.92 mg/dL — AB (ref 0.50–1.10)
Calcium: 9.7 mg/dL (ref 8.4–10.5)
Chloride: 92 mEq/L — ABNORMAL LOW (ref 96–112)
GFR calc Af Amer: 17 mL/min — ABNORMAL LOW (ref >90)
GFR, EST NON AFRICAN AMERICAN: 14 mL/min — AB (ref >90)
Glucose, Bld: 193 mg/dL — ABNORMAL HIGH (ref 70–99)
Potassium: 4.2 mmol/L (ref 3.5–5.1)
SODIUM: 131 mmol/L — AB (ref 135–145)
TOTAL PROTEIN: 8.3 g/dL (ref 6.0–8.3)
Total Bilirubin: 0.8 mg/dL (ref 0.3–1.2)

## 2014-07-18 LAB — I-STAT CG4 LACTIC ACID, ED
LACTIC ACID, VENOUS: 3.32 mmol/L — AB (ref 0.5–2.2)
Lactic Acid, Venous: 1.18 mmol/L (ref 0.5–2.2)

## 2014-07-18 LAB — LIPASE, BLOOD: Lipase: 20 U/L (ref 11–59)

## 2014-07-18 LAB — TROPONIN I: Troponin I: <0.03 ng/mL (ref <0.031)

## 2014-07-18 MED ORDER — SODIUM CHLORIDE 0.9 % IV SOLN: Freq: Once | INTRAVENOUS | Status: DC

## 2014-07-18 MED ORDER — SODIUM CHLORIDE 0.9 % IJ SOLN
3.0000 mL | Freq: Two times a day (BID) | INTRAMUSCULAR | Status: DC
  Administered 2014-07-18 – 2014-07-24 (×4): 3 mL via INTRAVENOUS

## 2014-07-18 MED ORDER — LEVOTHYROXINE SODIUM 100 MCG IV SOLR
25.0000 ug | Freq: Every day | INTRAVENOUS | Status: DC
  Administered 2014-07-20: 25 ug via INTRAVENOUS
  Filled 2014-07-18 (×3): qty 5

## 2014-07-18 MED ORDER — HYDROCODONE-ACETAMINOPHEN 5-325 MG PO TABS: 1.0000 | ORAL_TABLET | ORAL | Status: DC | PRN

## 2014-07-18 MED ORDER — ONDANSETRON HCL 4 MG PO TABS: 4.0000 mg | ORAL_TABLET | Freq: Four times a day (QID) | ORAL | Status: DC | PRN

## 2014-07-18 MED ORDER — ACETAMINOPHEN 650 MG RE SUPP: 650.0000 mg | Freq: Four times a day (QID) | RECTAL | Status: DC | PRN

## 2014-07-18 MED ORDER — SODIUM CHLORIDE 0.9 % IV BOLUS (SEPSIS)
1000.0000 mL | Freq: Once | INTRAVENOUS | Status: AC
  Administered 2014-07-18: 1000 mL via INTRAVENOUS

## 2014-07-18 MED ORDER — ONDANSETRON HCL 4 MG/2ML IJ SOLN: 4.0000 mg | Freq: Four times a day (QID) | INTRAMUSCULAR | Status: DC | PRN

## 2014-07-18 MED ORDER — ALLOPURINOL 100 MG PO TABS
100.0000 mg | ORAL_TABLET | Freq: Every day | ORAL | Status: DC
  Administered 2014-07-19 – 2014-07-24 (×6): 100 mg via ORAL
  Filled 2014-07-18 (×6): qty 1

## 2014-07-18 MED ORDER — FENTANYL CITRATE 0.05 MG/ML IJ SOLN: 50.0000 ug | Freq: Once | INTRAMUSCULAR | Status: DC

## 2014-07-18 MED ORDER — ACETAMINOPHEN 325 MG PO TABS: 650.0000 mg | ORAL_TABLET | Freq: Four times a day (QID) | ORAL | Status: DC | PRN

## 2014-07-18 MED ORDER — SODIUM CHLORIDE 0.9 % IV SOLN
INTRAVENOUS | Status: AC
  Administered 2014-07-18: 21:00:00 via INTRAVENOUS

## 2014-07-18 MED ORDER — ASPIRIN EC 81 MG PO TBEC
81.0000 mg | DELAYED_RELEASE_TABLET | Freq: Every day | ORAL | Status: DC
  Administered 2014-07-19 – 2014-07-26 (×8): 81 mg via ORAL
  Filled 2014-07-18 (×8): qty 1

## 2014-07-18 MED ORDER — IOHEXOL 300 MG/ML  SOLN: 50.0000 mL | Freq: Once | INTRAMUSCULAR | Status: AC | PRN

## 2014-07-18 NOTE — Consult Note (Signed)
Reason for Consult:SBO Referring Physician: Sr. Quintella Reichert PCP:  Dr. Macon Large  [name] is an 81 y.o. female.  HPI: [name] who presents to Ed with abdominal pain, nausea and vomiting that started 4 days ago.  Last BM on 07/15/14, unable to pass flatus since Sunday  She says she had sharpe crampy pain and kept hoping it would get better.  I think her children made her come to the ED.  She has not been able to keep any food and almost no clear liquids down.  She just had 2 large loose stools before I got into see her.  Work up in the ED shows some hypotension.  Na 131, creatinine up to 2.92, lactate 3.32, WBC 12.1.  Glucose 193. Acute abdomen films show:  SBO no free air, CT of the abdomen is pending without contrast because of acute renal failure. CT is still pending   Past Medical History  Diagnosis Date  . Allergy   . Hypertension   . Arthritis   . Cancer     ovarian  . Obesity   . Hyperlipidemia   . Broken foot     left    Past Surgical History  Procedure Laterality Date  . Cholecystectomy    . Total knee arthroplasty    . Cataract extraction    . Total abdominal hysterectomy w/ bilateral salpingoophorectomy      Family History  Problem Relation Age of Onset  . Diabetes      fhx  . Hypertension      fhx  . Obesity      fhx    Social History:  reports that she has never smoked. She does not have any smokeless tobacco history on file. She reports that she does not drink alcohol or use illicit drugs.  Allergies:  Allergies  Allergen Reactions  . Shellfish Allergy     Medications:  Prior to Admission:  (Not in a hospital admission) Scheduled: . fentaNYL  50 mcg Intravenous Once   Continuous: . sodium chloride     EGB:TDVVOHY Anti-infectives    None      Results for orders placed or performed during the hospital encounter of 07/18/14 (from the past 48 hour(s))  Lipase, blood     Status: None   Collection Time: 07/18/14  2:15 PM  Result  Value Ref Range   Lipase 20 11 - 59 U/L  Troponin I     Status: None   Collection Time: 07/18/14  2:15 PM  Result Value Ref Range   Troponin I <0.03 <0.031 ng/mL    Comment:        NO INDICATION OF MYOCARDIAL INJURY. Please note change in reference range.   CBC with Differential     Status: Abnormal   Collection Time: 07/18/14  2:17 PM  Result Value Ref Range   WBC 12.1 (H) 4.0 - 10.5 K/uL   RBC 4.95 3.87 - 5.11 MIL/uL   Hemoglobin 15.1 (H) 12.0 - 15.0 g/dL   HCT 44.7 36.0 - 46.0 %   MCV 90.3 78.0 - 100.0 fL   MCH 30.5 26.0 - 34.0 pg   MCHC 33.8 30.0 - 36.0 g/dL   RDW 14.6 11.5 - 15.5 %   Platelets 278 150 - 400 K/uL   Neutrophils Relative % 78 (H) 43 - 77 %   Neutro Abs 9.5 (H) 1.7 - 7.7 K/uL   Lymphocytes Relative 14 12 - 46 %   Lymphs Abs 1.6 0.7 -  4.0 K/uL   Monocytes Relative 8 3 - 12 %   Monocytes Absolute 1.0 0.1 - 1.0 K/uL   Eosinophils Relative 0 0 - 5 %   Eosinophils Absolute 0.0 0.0 - 0.7 K/uL   Basophils Relative 0 0 - 1 %   Basophils Absolute 0.0 0.0 - 0.1 K/uL  Comprehensive metabolic panel     Status: Abnormal   Collection Time: 07/18/14  2:17 PM  Result Value Ref Range   Sodium 131 (L) 135 - 145 mmol/L    Comment: Please note change in reference range.   Potassium 4.2 3.5 - 5.1 mmol/L    Comment: Please note change in reference range.   Chloride 92 (L) 96 - 112 mEq/L   CO2 26 19 - 32 mmol/L   Glucose, Bld 193 (H) 70 - 99 mg/dL   BUN 91 (H) 6 - 23 mg/dL   Creatinine, Ser 2.92 (H) 0.50 - 1.10 mg/dL   Calcium 9.7 8.4 - 10.5 mg/dL   Total Protein 8.3 6.0 - 8.3 g/dL   Albumin 4.9 3.5 - 5.2 g/dL   AST 23 0 - 37 U/L   ALT 13 0 - 35 U/L   Alkaline Phosphatase 76 39 - 117 U/L   Total Bilirubin 0.8 0.3 - 1.2 mg/dL   GFR calc non Af Amer 14 (L) >90 mL/min   GFR calc Af Amer 17 (L) >90 mL/min    Comment: (NOTE) The eGFR has been calculated using the CKD EPI equation. This calculation has not been validated in all clinical situations. eGFR's persistently  <90 mL/min signify possible Chronic Kidney Disease.    Anion gap 13 5 - 15  I-Stat CG4 Lactic Acid, ED     Status: Abnormal   Collection Time: 07/18/14  2:28 PM  Result Value Ref Range   Lactic Acid, Venous 3.32 (H) 0.5 - 2.2 mmol/L    Dg Abd Acute W/chest  07/18/2014   CLINICAL DATA:  Upper abdominal pain for 3 days with vomiting. History of hypertension. Initial encounter.  EXAM: ACUTE ABDOMEN SERIES (ABDOMEN 2 VIEW & CHEST 1 VIEW)  COMPARISON:  Chest radiographs 07/14/2007.  FINDINGS: The heart size and mediastinal contours are stable allowing for slight patient rotation to the right. There is aortic atherosclerosis and mild left basilar atelectasis or scarring. The lungs are otherwise clear. There is no pleural effusion.  There is moderate diffuse small bowel distension with air- fluid levels at different levels on the decubitus view. The colon is relatively decompressed. There is no evidence of free intraperitoneal air. Cholecystectomy clips and mild lumbar spine degenerative changes are noted.  IMPRESSION: 1. Small bowel obstruction pattern without evidence of perforation. CT may be helpful for further evaluation. 2. No acute cardiopulmonary process.   Electronically Signed   By: Camie Patience M.D.   On: 07/18/2014 15:10    Review of Systems  Constitutional: Positive for fever (low grade 99-100) and chills. Negative for weight loss, malaise/fatigue and diaphoresis.  HENT: Negative.   Eyes: Negative.   Respiratory: Negative.   Cardiovascular: Positive for leg swelling.  Gastrointestinal: Positive for nausea, vomiting and abdominal pain. Negative for heartburn, diarrhea, constipation, blood in stool and melena.  Genitourinary: Negative.  Frequency: she has limited activity due to right knee.  Musculoskeletal: Positive for back pain and joint pain.  Skin: Negative.   Neurological: Negative.  Negative for weakness.  Endo/Heme/Allergies: Negative.   Psychiatric/Behavioral: Negative.     Blood pressure 117/55, pulse 77, temperature 98 F (36.7  C), temperature source Oral, resp. rate 14, SpO2 98 %. Physical Exam  Constitutional: She is oriented to person, place, and time. She appears well-developed and well-nourished. No distress.  BP 117/55 mmHg  Pulse 77  Temp(Src) 98 F (36.7 C) (Oral)  Resp 14  SpO2 98%  She just had 2 large BM's, very loose stool.  HENT:  Head: Normocephalic and atraumatic.  Nose: Nose normal.  Eyes: Conjunctivae and EOM are normal. Pupils are equal, round, and reactive to light. Right eye exhibits no discharge. Left eye exhibits no discharge. No scleral icterus.  Neck: Normal range of motion. Neck supple. No JVD present. No tracheal deviation present. No thyromegaly present.  Cardiovascular: Normal rate, regular rhythm, normal heart sounds and intact distal pulses.   No murmur heard. Respiratory: Effort normal and breath sounds normal. No respiratory distress. She has no wheezes. She has no rales. She exhibits no tenderness.  GI: She exhibits no distension and no mass. There is no tenderness. There is no rebound and no guarding.  Mid line surgical scar below the umbilicus.  Musculoskeletal: She exhibits no edema or tenderness.  Lymphadenopathy:    She has no cervical adenopathy.  Neurological: She is alert and oriented to person, place, and time. No cranial nerve deficit.  Skin: Skin is warm and dry. No rash noted. She is not diaphoretic. No erythema. No pallor.  Psychiatric: She has a normal mood and affect. Her behavior is normal. Judgment and thought content normal.    Assessment/Plan:   SBO with history of hysterectomy for ovarian cancer, Prior cholecystectomy Hypertension Acute on chronic renal failure Arthritis S/p right knee replacement   Obesity   Plan:  She has clearly opened up, with 2 large BM's, just before I came to see her.   CT is still pending.  She needs to be admitted for hydration and to be sure her renal function  gets back to baseline.  She has no pain now.  We will follow with Medicine.       Azyria Osmon 07/18/2014, 3:56 PM

## 2014-07-18 NOTE — ED Notes (Signed)
Pt had large bm followed by multiple episodes of liquid stool .  MD notified.

## 2014-07-18 NOTE — ED Notes (Signed)
[place] notified of ready bed and ed nurse notified.Marland Kitchenkjl

## 2014-07-18 NOTE — ED Provider Notes (Signed)
[name] is a 81 year old female with past medical history of hypothyroidism, hyperlipidemia, gout, hypertension, osteoarthritis, asthma who presents the ER complaining of 4 days of abdominal pain and vomiting. Patient reports gradual onset of upper abdominal pain, which has become diffuse in nature. Patient reports his pain is cramping in nature, initially coming and going, now is constant.  PE: Constitutional: well-developed, well-nourished, moderate amount of stress due to pain. Patient vomiting HENT: normocephalic, atraumatic Cardiovascular: normal rate and rhythm, distal pulses intact Pulmonary/Chest: effort normal; breath sounds clear and equal bilaterally; no wheezes or rales Abdominal: Mildly distended with diffuse tenderness.  Musculoskeletal: full ROM, no edema Lymphadenopathy: no cervical adenopathy Neurological: alert with goal directed thinking Skin: warm and dry, no rash, no diaphoresis Psychiatric: normal mood and affect, normal behavior   Patient's family asked me to evaluate patient because they felt she was going to pass out while in fast track. Patient not appropriate to fast track, and is set to have a bed and regular ED. I will place labs and send patient to main ED.  Signed,  Dahlia Bailiff, PA-C 9:40 PM   Carrie Mew, PA-C 07/18/14 2140  Francine Graven, DO 07/21/14 [phone]

## 2014-07-18 NOTE — Telephone Encounter (Signed)
Pt currently admitted.

## 2014-07-18 NOTE — ED Provider Notes (Signed)
CSN: [id number]     Arrival date & time 07/18/14  1316 History   First MD Initiated Contact with Patient 07/18/14 1458     Chief Complaint  Patient presents with  . Abdominal Pain  . Emesis     Patient is a 81 y.o. female presenting with abdominal pain and vomiting. The history is provided by the patient. No language interpreter was used.  Abdominal Pain Associated symptoms: vomiting   Emesis Associated symptoms: abdominal pain    [name] presents for evaluation of abdominal pain. She's had 3 days of crampy left upper quadrant and upper abdominal pain. Pain is waxing and waning in nature. It is worse with fluids. She denies any fevers. She's had multiple episodes of emesis which is bilious in nature. Last bowel movement was on Sunday and she is unable to pass gas. She denies any dysuria. She has a history of cholecystectomy and total hysterectomy. Symptoms are severe, waxing and wannig, worsening.  Past Medical History  Diagnosis Date  . Allergy   . Hypertension   . Arthritis   . Cancer     ovarian  . Obesity   . Hyperlipidemia   . Broken foot     left   Past Surgical History  Procedure Laterality Date  . Cholecystectomy    . Total knee arthroplasty    . Cataract extraction    . Total abdominal hysterectomy w/ bilateral salpingoophorectomy     Family History  Problem Relation Age of Onset  . Diabetes      fhx  . Hypertension      fhx  . Obesity      fhx   History  Substance Use Topics  . Smoking status: Never Smoker   . Smokeless tobacco: Not on file  . Alcohol Use: No   OB History    No data available     Review of Systems  Gastrointestinal: Positive for vomiting and abdominal pain.  All other systems reviewed and are negative.     Allergies  Shellfish allergy  Home Medications   Prior to Admission medications   Medication Sig Start Date End Date Taking? Authorizing Provider  acetaminophen (TYLENOL) 650 MG CR tablet Take 650 mg by mouth every  8 (eight) hours as needed for pain (pain).    Yes Historical Provider, MD  allopurinol (ZYLOPRIM) 100 MG tablet take 1 tablet by mouth twice a day 05/21/14  Yes Dorena Cookey, MD  amLODipine-valsartan (EXFORGE) 10-160 MG per tablet Take 1 tablet by mouth daily. 05/21/14  Yes Dorena Cookey, MD  aspirin 81 MG EC tablet Take 81 mg by mouth daily.     Yes Historical Provider, MD  calcitRIOL (ROCALTROL) 0.25 MCG capsule Take 0.25 mcg by mouth daily.  01/22/11  Yes Historical Provider, MD  calcium carbonate (TUMS) 500 MG chewable tablet Chew 1 tablet by mouth daily.     Yes Historical Provider, MD  cetirizine (ZYRTEC) 10 MG tablet Take 10 mg by mouth daily.     Yes Historical Provider, MD  furosemide (LASIX) 40 MG tablet take 1 tablet by mouth every morning 05/21/14  Yes Dorena Cookey, MD  levothyroxine (SYNTHROID, LEVOTHROID) 50 MCG tablet take 1 tablet by mouth DAILY THEN 1/2 TABLET THE NEXT DAY ROTATING 05/21/14  Yes Dorena Cookey, MD  potassium chloride SA (KLOR-CON M20) 20 MEQ tablet take 1 tablet by mouth once daily 05/21/14  Yes Dorena Cookey, MD  PREMARIN 0.3 MG tablet take 1 tablet  by mouth once daily for 21 DAYS THEN DO NOT TAKE FOR 7 DAYS 06/07/14  Yes Dorena Cookey, MD  simvastatin (ZOCOR) 20 MG tablet take 1 tablet by mouth at bedtime 05/21/14  Yes Dorena Cookey, MD   BP 97/42 mmHg  Pulse 84  Temp(Src) 98 F (36.7 C) (Oral)  Resp 18  SpO2 99% Physical Exam  Constitutional: She appears well-developed and well-nourished.  HENT:  Head: Normocephalic and atraumatic.  Cardiovascular: Normal rate and regular rhythm.   No murmur heard. Pulmonary/Chest: Effort normal and breath sounds normal. No respiratory distress.  Abdominal: There is no rebound and no guarding.  Upper abdominal distention, moderate LUQ tenderness.  High pitched bowel sounds in upper abdomen.    Musculoskeletal: She exhibits no edema or tenderness.  Neurological: She is alert.  Skin: Skin is warm and dry.   Psychiatric: She has a normal mood and affect. Her behavior is normal.  Nursing note and vitals reviewed.   ED Course  Procedures (including critical care time) Labs Review Labs Reviewed  CBC WITH DIFFERENTIAL - Abnormal; Notable for the following:    WBC 12.1 (*)    Hemoglobin 15.1 (*)    Neutrophils Relative % 78 (*)    Neutro Abs 9.5 (*)    All other components within normal limits  COMPREHENSIVE METABOLIC PANEL - Abnormal; Notable for the following:    Sodium 131 (*)    Chloride 92 (*)    Glucose, Bld 193 (*)    BUN 91 (*)    Creatinine, Ser 2.92 (*)    GFR calc non Af Amer 14 (*)    GFR calc Af Amer 17 (*)    All other components within normal limits  I-STAT CG4 LACTIC ACID, ED - Abnormal; Notable for the following:    Lactic Acid, Venous 3.32 (*)    All other components within normal limits  URINALYSIS, ROUTINE W REFLEX MICROSCOPIC  LIPASE, BLOOD  TROPONIN I    Imaging Review No results found.   EKG Interpretation   Date/Time:  Wednesday July 18 2014 14:07:44 EST Ventricular Rate:  72 PR Interval:  164 QRS Duration: 100 QT Interval:  427 QTC Calculation: 467 R Axis:   62 Text Interpretation:  Sinus rhythm Consider left atrial enlargement Low  voltage, precordial leads Borderline repolarization abnormality Confirmed  by Hazle Coca [phone]) on 07/18/2014 3:00:48 PM      MDM   Final diagnoses:  Acute renal failure, unspecified acute renal failure type  Small bowel obstruction    D/w General Surgery - will see in consult, recommends admission to medicine to assist in management of ARF.  1837 recheck - pt feeling improved.  She has had multiple watery bowel movements since taking po contrast for CT abd.  D/w pt findings of studies and need for admission.  Medicine consulted for admission.  On repeat evaluation pt mentions that she has a hx/o intraperitoneal chemotherapy for ovarian cancer in 1989.    Quintella Reichert, MD 07/18/14 [phone]

## 2014-07-18 NOTE — H&P (Signed)
PCP:  Joycelyn Man, MD  [facility]   Chief Complaint:  Abdominal pain HPI: [name] is a 81 y.o. female   has a past medical history of Allergy; Hypertension; Arthritis; Cancer; Obesity; Hyperlipidemia; and Broken foot.   Presented with  3 day hx of nausea and vomiting, she felt like she had a unble in her abdomen and having sharp pain. She have not had a BM since Sunday. No fevers or chills Patient have had hysterectomy and cholecystectomy. 25 years ago she had ovarian cancer removed and had radiation therapy to the mesentery. CT scan was done showing Small bowel dilatation with air-fluid levels in the stomach, small bowel and colon. Decompressed loops of distal small bowel are identified with a relative transition point in the anterior right mid abdomen. The appearance is concerning for a small bowel obstruction. When patient drank her CT contrast she developed severe diarrhea up to 10 BM's. The diarrhea very loose ad watery. No blood in stool  Hospitalist was called for admission for SBO, dehydration acute on chronic Renal failure and diarrhea  Review of Systems:    Pertinent positives include: nausea, vomiting, diarrhea, abdominal pain,   Constitutional:  No weight loss, night sweats, Fevers, chills, fatigue, weight loss  HEENT:  No headaches, Difficulty swallowing,Tooth/dental problems,Sore throat,  No sneezing, itching, ear ache, nasal congestion, post nasal drip,  Cardio-vascular:  No chest pain, Orthopnea, PND, anasarca, dizziness, palpitations.no Bilateral lower extremity swelling  GI:  No heartburn, indigestion, change in bowel habits, loss of appetite, melena, blood in stool, hematemesis Resp:  no shortness of breath at rest. No dyspnea on exertion, No excess mucus, no productive cough, No non-productive cough, No coughing up of blood.No change in color of mucus.No wheezing. Skin:  no rash or lesions. No jaundice GU:  no dysuria, change in color of  urine, no urgency or frequency. No straining to urinate.  No flank pain.  Musculoskeletal:  No joint pain or no joint swelling. No decreased range of motion. No back pain.  Psych:  No change in mood or affect. No depression or anxiety. No memory loss.  Neuro: no localizing neurological complaints, no tingling, no weakness, no double vision, no gait abnormality, no slurred speech, no confusion  Otherwise ROS are negative except for above, 10 systems were reviewed  Past Medical History: Past Medical History  Diagnosis Date  . Allergy   . Hypertension   . Arthritis   . Cancer     ovarian  . Obesity   . Hyperlipidemia   . Broken foot     left   Past Surgical History  Procedure Laterality Date  . Cholecystectomy    . Total knee arthroplasty    . Cataract extraction    . Total abdominal hysterectomy w/ bilateral salpingoophorectomy       Medications: Prior to Admission medications   Medication Sig Start Date End Date Taking? Authorizing Provider  acetaminophen (TYLENOL) 650 MG CR tablet Take 650 mg by mouth every 8 (eight) hours as needed for pain (pain).    Yes Historical Provider, MD  allopurinol (ZYLOPRIM) 100 MG tablet take 1 tablet by mouth twice a day 05/21/14  Yes Dorena Cookey, MD  amLODipine-valsartan (EXFORGE) 10-160 MG per tablet Take 1 tablet by mouth daily. 05/21/14  Yes Dorena Cookey, MD  aspirin 81 MG EC tablet Take 81 mg by mouth daily.     Yes Historical Provider, MD  calcitRIOL (ROCALTROL) 0.25 MCG capsule Take 0.25 mcg  by mouth daily.  01/22/11  Yes Historical Provider, MD  calcium carbonate (TUMS) 500 MG chewable tablet Chew 1 tablet by mouth daily.     Yes Historical Provider, MD  cetirizine (ZYRTEC) 10 MG tablet Take 10 mg by mouth daily.     Yes Historical Provider, MD  furosemide (LASIX) 40 MG tablet take 1 tablet by mouth every morning 05/21/14  Yes Dorena Cookey, MD  levothyroxine (SYNTHROID, LEVOTHROID) 50 MCG tablet take 1 tablet by mouth DAILY THEN  1/2 TABLET THE NEXT DAY ROTATING 05/21/14  Yes Dorena Cookey, MD  potassium chloride SA (KLOR-CON M20) 20 MEQ tablet take 1 tablet by mouth once daily 05/21/14  Yes Dorena Cookey, MD  PREMARIN 0.3 MG tablet take 1 tablet by mouth once daily for 21 DAYS THEN DO NOT TAKE FOR 7 DAYS 06/07/14  Yes Dorena Cookey, MD  simvastatin (ZOCOR) 20 MG tablet take 1 tablet by mouth at bedtime 05/21/14  Yes Dorena Cookey, MD    Allergies:   Allergies  Allergen Reactions  . Shellfish Allergy     Social History:  Ambulatory   independently  Lives at home alone,    reports that she has never smoked. She has never used smokeless tobacco. She reports that she does not drink alcohol or use illicit drugs.    Family History: family history includes Diabetes in an other family member; Hypertension in an other family member; Obesity in an other family member.    Physical Exam: Patient Vitals for the past 24 hrs:  BP Temp Temp src Pulse Resp SpO2  07/18/14 1808 (!) 120/54 mmHg 98.2 F (36.8 C) Oral 77 20 98 %  07/18/14 1549 117/55 mmHg - - 77 14 98 %  07/18/14 1414 (!) 97/42 mmHg - - - - -  07/18/14 1323 99/83 mmHg 98 F (36.7 C) Oral 84 18 99 %    1. General:  in No Acute distress 2. Psychological: Alert and Oriented 3. Head/ENT:    Dry Mucous Membranes                          Head Non traumatic, neck supple                          Normal   Dentition 4. SKIN:   decreased Skin turgor,  Skin clean Dry and intact no rash 5. Heart: Regular rate and rhythm no Murmur, Rub or gallop 6. Lungs: Clear to auscultation bilaterally, no wheezes or crackles   7. Abdomen: Soft, non-tender, slightly distended, bowel sounds present 8. Lower extremities: no clubbing, cyanosis, or edema 9. Neurologically Grossly intact, moving all 4 extremities equally 10. MSK: Normal range of motion  body mass index is unknown because there is no weight on file.   Labs on Admission:   Results for orders placed or  performed during the hospital encounter of 07/18/14 (from the past 24 hour(s))  Lipase, blood     Status: None   Collection Time: 07/18/14  2:15 PM  Result Value Ref Range   Lipase 20 11 - 59 U/L  Troponin I     Status: None   Collection Time: 07/18/14  2:15 PM  Result Value Ref Range   Troponin I <0.03 <0.031 ng/mL  CBC with Differential     Status: Abnormal   Collection Time: 07/18/14  2:17 PM  Result Value Ref Range  WBC 12.1 (H) 4.0 - 10.5 K/uL   RBC 4.95 3.87 - 5.11 MIL/uL   Hemoglobin 15.1 (H) 12.0 - 15.0 g/dL   HCT 44.7 36.0 - 46.0 %   MCV 90.3 78.0 - 100.0 fL   MCH 30.5 26.0 - 34.0 pg   MCHC 33.8 30.0 - 36.0 g/dL   RDW 14.6 11.5 - 15.5 %   Platelets 278 150 - 400 K/uL   Neutrophils Relative % 78 (H) 43 - 77 %   Neutro Abs 9.5 (H) 1.7 - 7.7 K/uL   Lymphocytes Relative 14 12 - 46 %   Lymphs Abs 1.6 0.7 - 4.0 K/uL   Monocytes Relative 8 3 - 12 %   Monocytes Absolute 1.0 0.1 - 1.0 K/uL   Eosinophils Relative 0 0 - 5 %   Eosinophils Absolute 0.0 0.0 - 0.7 K/uL   Basophils Relative 0 0 - 1 %   Basophils Absolute 0.0 0.0 - 0.1 K/uL  Comprehensive metabolic panel     Status: Abnormal   Collection Time: 07/18/14  2:17 PM  Result Value Ref Range   Sodium 131 (L) 135 - 145 mmol/L   Potassium 4.2 3.5 - 5.1 mmol/L   Chloride 92 (L) 96 - 112 mEq/L   CO2 26 19 - 32 mmol/L   Glucose, Bld 193 (H) 70 - 99 mg/dL   BUN 91 (H) 6 - 23 mg/dL   Creatinine, Ser 2.92 (H) 0.50 - 1.10 mg/dL   Calcium 9.7 8.4 - 10.5 mg/dL   Total Protein 8.3 6.0 - 8.3 g/dL   Albumin 4.9 3.5 - 5.2 g/dL   AST 23 0 - 37 U/L   ALT 13 0 - 35 U/L   Alkaline Phosphatase 76 39 - 117 U/L   Total Bilirubin 0.8 0.3 - 1.2 mg/dL   GFR calc non Af Amer 14 (L) >90 mL/min   GFR calc Af Amer 17 (L) >90 mL/min   Anion gap 13 5 - 15  I-Stat CG4 Lactic Acid, ED     Status: Abnormal   Collection Time: 07/18/14  2:28 PM  Result Value Ref Range   Lactic Acid, Venous 3.32 (H) 0.5 - 2.2 mmol/L    UA not obtained  Lab  Results  Component Value Date   HGBA1C 5.6 03/02/2012    CrCl cannot be calculated (Unknown ideal weight.).  BNP (last 3 results) No results for input(s): PROBNP in the last 8760 hours.  Other results:  I have pearsonaly reviewed this: ECG REPORT  Rate: 72  Rhythm: NSR ST&T Change: no ischemic changes   There were no vitals filed for this visit.   Cultures: No results found for: SDES, SPECREQUEST, CULT, REPTSTATUS   Radiological Exams on Admission: Ct Abdomen Pelvis Wo Contrast  07/18/2014   CLINICAL DATA:  Vomiting with weakness for 3 days. Mid abdominal pain.  EXAM: CT ABDOMEN AND PELVIS WITHOUT CONTRAST  TECHNIQUE: Multidetector CT imaging of the abdomen and pelvis was performed following the standard protocol without IV contrast.  COMPARISON:  None.  FINDINGS: The lung bases are clear.  There is a punctate nonobstructing left renal calculus. There is no ureteral or bladder calculus. No obstructive uropathy. No perinephric stranding is seen. There is a 1.9 x 2 cm hypodense, fluid attenuating left renal mass most consistent with a cyst. There is a 7 mm hyperdense left renal mass measuring 72 Hounsfield units likely representing a hemorrhagic cyst. There is a 14 x 13 mm hypodense, fluid attenuating right renal mass  most consistent with a cyst. There is a similar smaller 14 mm lesion in the right posterior interpolar kidney most consistent with a cyst. The bladder is unremarkable.  The liver demonstrates no focal abnormality. The gallbladder is surgically absent. The spleen demonstrates no focal abnormality. The adrenal glands and pancreas are normal.  There is small bowel dilatation measuring up to 5 cm in diameter. There are air-fluid levels within the small bowel and colon. There is a gastric air-fluid level. There are decompressed loops of distal small bowel with a relative transition point in the anterior right mid abdomen. There is a normal caliber appendix in the right lower  quadrant without periappendiceal inflammatory changes. There is no pneumoperitoneum, pneumatosis, or portal venous gas. There is no abdominal or pelvic free fluid. There is no lymphadenopathy.  The abdominal aorta is normal in caliber with atherosclerosis.  There is degenerative disc disease throughout the lumbar spine. There is degenerative facet arthropathy throughout the lumbar spine. 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  IMPRESSION: 1. Small bowel dilatation with air-fluid levels in the stomach, small bowel and colon. Decompressed loops of distal small bowel are identified with a relative transition point in the anterior right mid abdomen. The appearance is concerning for a small bowel obstruction. 2. The air-fluid levels within the colon may be related to a colonic ileus versus diarrhea secondary to an infectious etiology.   Electronically Signed   By: Kathreen Devoid   On: 07/18/2014 18:24   Dg Abd Acute W/chest  07/18/2014   CLINICAL DATA:  Upper abdominal pain for 3 days with vomiting. History of hypertension. Initial encounter.  EXAM: ACUTE ABDOMEN SERIES (ABDOMEN 2 VIEW & CHEST 1 VIEW)  COMPARISON:  Chest radiographs 07/14/2007.  FINDINGS: The heart size and mediastinal contours are stable allowing for slight patient rotation to the right. There is aortic atherosclerosis and mild left basilar atelectasis or scarring. The lungs are otherwise clear. There is no pleural effusion.  There is moderate diffuse small bowel distension with air- fluid levels at different levels on the decubitus view. The colon is relatively decompressed. There is no evidence of free intraperitoneal air. Cholecystectomy clips and mild lumbar spine degenerative changes are noted.  IMPRESSION: 1. Small bowel obstruction pattern without evidence of perforation. CT may be helpful for further evaluation. 2. No acute cardiopulmonary process.   Electronically Signed   By: Camie Patience M.D.   On: 07/18/2014 15:10    Chart has been  reviewed  Assessment/Plan  81 year old female with prior history of intra-abdominal surgery presents with abdominal pain distention was found to have small bowel obstruction. Thereafter developed perfuse diarrhea being evaluated for C. difficile. His to have acute on chronic renal failure likely secondary to dehydration.   Present on Admission:  . SBO (small bowel obstruction)- surgery is aware. Serial abdominal exams. KUB in the morning. Bowel rest  . Essential hypertension - even initially low blood pressures will hold BP meds  . Acute on chronic renal failure - obtain urine electrolytes, give IV fluids follow creatinine  . Hyponatremia  - in a setting dehydration will hold Lasix obtain urine electrolytes give IV fluids  . Dehydration - IV fluids  . Diarrhea-  obtain C. difficile PCR, stool cultures will not initiate Flagyl at this point as patient has no significant risk factors for C. difficile and does not appear ill   Prophylaxis: SCD   CODE STATUS:  FULL CODE    Other plan as per orders.  I have  spent a total of 47min on this admission  Akash Winski 07/18/2014, 6:57 PM  [facility]  Pager [phone]   after 2 AM please page floor coverage PA If 7AM-7PM, please contact the day team taking care of the patient  [email or web address]  Password [email or web address]

## 2014-07-18 NOTE — Telephone Encounter (Signed)
Patient Name: [name]  DOB: 1934-06-13    Nurse Assessment  Nurse: Leilani Merl, RN, Heather Date/Time (Eastern Time): 07/18/2014 11:26:22 AM  Confirm and document reason for call. If symptomatic, describe symptoms. ---Caller states her mother has not been able to keep anything down for abt 48 hrs, has severe stomach cramps.  Has the patient traveled out of the country within the last 30 days? ---Not Applicable  Does the patient require triage? ---Yes  Related visit to physician within the last 2 weeks? ---No  Does the PT have any chronic conditions? (i.e. diabetes, asthma, etc.) ---Yes  List chronic conditions. ---HTN     Guidelines    Guideline Title Affirmed Question Affirmed Notes  Vomiting [1] MODERATE vomiting (e.g., 3 - 5 times/day) AND [2] age > 67    Final Disposition User   Go to ED Now (or PCP triage) Leilani Merl, RN, [employer]

## 2014-07-18 NOTE — ED Notes (Signed)
Pt presents with c/o vomiting and abdominal pain that started on Sunday. Pt reports the pain is sharp in nature. Pt called her doctor today and was told to come here. Pt reports she has been vomiting up her daily medications.

## 2014-07-18 NOTE — ED Notes (Addendum)
Attempted to get a urine sample from the pt but she had a bowel movement

## 2014-07-19 ENCOUNTER — Inpatient Hospital Stay (HOSPITAL_COMMUNITY): Payer: Medicare Other

## 2014-07-19 LAB — CBC
HEMATOCRIT: 36.4 % (ref 36.0–46.0)
Hemoglobin: 11.6 g/dL — ABNORMAL LOW (ref 12.0–15.0)
MCH: 29.2 pg (ref 26.0–34.0)
MCHC: 31.9 g/dL (ref 30.0–36.0)
MCV: 91.7 fL (ref 78.0–100.0)
PLATELETS: 203 10*3/uL (ref 150–400)
RBC: 3.97 MIL/uL (ref 3.87–5.11)
RDW: 14.8 % (ref 11.5–15.5)
WBC: 10 10*3/uL (ref 4.0–10.5)

## 2014-07-19 LAB — URINALYSIS, ROUTINE W REFLEX MICROSCOPIC
BILIRUBIN URINE: NEGATIVE
GLUCOSE, UA: NEGATIVE mg/dL
Hgb urine dipstick: NEGATIVE
Ketones, ur: NEGATIVE mg/dL
Leukocytes, UA: NEGATIVE
Nitrite: NEGATIVE
PH: 5 (ref 5.0–8.0)
Protein, ur: NEGATIVE mg/dL
Specific Gravity, Urine: 1.016 (ref 1.005–1.030)
UROBILINOGEN UA: 0.2 mg/dL (ref 0.0–1.0)

## 2014-07-19 LAB — COMPREHENSIVE METABOLIC PANEL
ALBUMIN: 3.6 g/dL (ref 3.5–5.2)
ALK PHOS: 56 U/L (ref 39–117)
ALT: 9 U/L (ref 0–35)
ANION GAP: 11 (ref 5–15)
AST: 13 U/L (ref 0–37)
BILIRUBIN TOTAL: 0.6 mg/dL (ref 0.3–1.2)
BUN: 85 mg/dL — AB (ref 6–23)
CO2: 23 mmol/L (ref 19–32)
CREATININE: 2.15 mg/dL — AB (ref 0.50–1.10)
Calcium: 8.2 mg/dL — ABNORMAL LOW (ref 8.4–10.5)
Chloride: 100 mEq/L (ref 96–112)
GFR calc non Af Amer: 21 mL/min — ABNORMAL LOW (ref >90)
GFR, EST AFRICAN AMERICAN: 24 mL/min — AB (ref >90)
GLUCOSE: 103 mg/dL — AB (ref 70–99)
Potassium: 3.6 mmol/L (ref 3.5–5.1)
SODIUM: 134 mmol/L — AB (ref 135–145)
TOTAL PROTEIN: 6.1 g/dL (ref 6.0–8.3)

## 2014-07-19 LAB — OSMOLALITY, URINE: Osmolality, Ur: 493 mOsm/kg (ref 390–1090)

## 2014-07-19 LAB — SODIUM, URINE, RANDOM: Sodium, Ur: 13 mEq/L

## 2014-07-19 LAB — FECAL LACTOFERRIN, QUANT: FECAL LACTOFERRIN: POSITIVE

## 2014-07-19 LAB — PHOSPHORUS: PHOSPHORUS: 4.1 mg/dL (ref 2.3–4.6)

## 2014-07-19 LAB — CREATININE, URINE, RANDOM: Creatinine, Urine: 139.1 mg/dL

## 2014-07-19 LAB — CLOSTRIDIUM DIFFICILE BY PCR: CDIFFPCR: NEGATIVE

## 2014-07-19 LAB — MAGNESIUM: Magnesium: 2.5 mg/dL (ref 1.5–2.5)

## 2014-07-19 LAB — GLUCOSE, CAPILLARY
Glucose-Capillary: 89 mg/dL (ref 70–99)
Glucose-Capillary: 64 mg/dL — ABNORMAL LOW (ref 70–99)
Glucose-Capillary: 119 mg/dL — ABNORMAL HIGH (ref 70–99)
Glucose-Capillary: 98 mg/dL (ref 70–99)

## 2014-07-19 LAB — TSH: TSH: 1.342 u[IU]/mL (ref 0.350–4.500)

## 2014-07-19 MED ORDER — POTASSIUM CHLORIDE IN NACL 20-0.9 MEQ/L-% IV SOLN
INTRAVENOUS | Status: DC
  Administered 2014-07-19 – 2014-07-20 (×3): via INTRAVENOUS
  Filled 2014-07-19 (×3): qty 1000

## 2014-07-19 MED ORDER — METOPROLOL TARTRATE 1 MG/ML IV SOLN
5.0000 mg | Freq: Once | INTRAVENOUS | Status: AC
  Administered 2014-07-19: 5 mg via INTRAVENOUS
  Filled 2014-07-19: qty 5

## 2014-07-19 NOTE — Progress Notes (Signed)
[facility] PROGRESS NOTE  Brandy YOUNGREN [id number] DOB: 01-06-1934 DOA: 07/18/2014 PCP: Joycelyn Man, MD  Summary/subjective: I have seen and examined [name] and bedside in the presence of family members and reviewed her chart. Appreciate gen surgery. Chenique Henton Schultz is a very pleasant 81 y.o. female with history of Allergies; essential hypertension; Arthritis; ovarian Cancer; Obesity; Hyperlipidemia; Broken foot and CKD stage III baseline creatinine 1.9 in November 2015(followed by Dr. Jimmy Footman) will came in with 3 day history of intractable vomiting and was found to have suggestion of small bowel obstruction with CT abdomen and pelvis showing "1. Small bowel dilatation with air-fluid levels in the stomach, small bowel and colon. Decompressed loops of distal small bowel are identified with a relative transition point in the anterior right mid abdomen. The appearance is concerning for a small bowel obstruction. 2. The air-fluid levels within the colon may be related to a colonic ileus versus diarrhea secondary to an infectious etiology". She also had acute kidney injury with a BUN/CR-91/2.92, which may have been exacerbated by diuretics in setting of dehydration. She had leukocytosis of 12,000 as well as lactic acidosis raising concern for ischemia/infection. UA/chest x-ray were unremarkable. She has had multiple abdominal procedures raising possibility of adhesions. She reports feeling 100% better as she is no longer vomiting. She developed episode of diarrhea after receiving contrast yesterday but C.diff pcr is negative. She denies recent usage of antibiotics. Renal function is improving. Will continue IV fluids and defer to general surgery for management of SBO. She has been started on clear liquids. Will continue IV fluids for now. Plan SBO (small bowel obstruction)/ Diarrhea  Defer to general surgery.  Discontinue contact isolation Acute on chronic renal failure/CKD  (chronic kidney disease) stage 3, GFR 30-59 ml/min/Hyponatremia/Dehydration  Bun/Cr 85/2.15  Continue IV fluids. Expect renal function to get back to baseline.  Keep holding Lasix. Essential hypertension  Monitor BP and resume home meds once clinically stable. Code Status: Full Code. Family Communication: Spoke with daughter and son at bedside. Disposition Plan: Eventually home   Consultants:  General surgery  Procedures:  None  Antibiotics:  None   Objective: Filed Vitals:   07/19/14 1441  BP: 138/56  Pulse: 72  Temp: 97.9 F (36.6 C)  Resp: 18    Intake/Output Summary (Last 24 hours) at 07/19/14 1656 Last data filed at 07/19/14 0600  Gross per 24 hour  Intake 908.33 ml  Output    600 ml  Net 308.33 ml   Filed Weights   07/18/14 2011  Weight: 86.8 kg (191 lb 5.8 oz)    Exam:   General:  Comfortable at rest  Cardiovascular: S1S2 normal no murmurs. RRR.   Respiratory: Lungs clear  Abdomen: Soft and nontender. Positive bowel sounds.  Musculoskeletal  no pedal edema.   Data Reviewed: Basic Metabolic Panel:  Recent Labs Lab 07/18/14 1417 07/19/14 0330  NA 131* 134*  K 4.2 3.6  CL 92* 100  CO2 26 23  GLUCOSE 193* 103*  BUN 91* 85*  CREATININE 2.92* 2.15*  CALCIUM 9.7 8.2*  MG  --  2.5  PHOS  --  4.1   Liver Function Tests:  Recent Labs Lab 07/18/14 1417 07/19/14 0330  AST 23 13  ALT 13 9  ALKPHOS 76 56  BILITOT 0.8 0.6  PROT 8.3 6.1  ALBUMIN 4.9 3.6    Recent Labs Lab 07/18/14 1415  LIPASE 20   No results for input(s): AMMONIA in the last 168 hours. CBC:  Recent Labs Lab 07/18/14 1417 07/19/14 0330  WBC 12.1* 10.0  NEUTROABS 9.5*  --   HGB 15.1* 11.6*  HCT 44.7 36.4  MCV 90.3 91.7  PLT 278 203   Cardiac Enzymes:  Recent Labs Lab 07/18/14 1415  TROPONINI <0.03   BNP (last 3 results) No results for input(s): PROBNP in the last 8760 hours. CBG:  Recent Labs Lab 07/19/14 0803 07/19/14 1200  07/19/14 1458  GLUCAP 89 64* 119*    Recent Results (from the past 240 hour(s))  Clostridium Difficile by PCR     Status: None   Collection Time: 07/18/14  5:54 PM  Result Value Ref Range Status   C difficile by pcr NEGATIVE NEGATIVE Final    Comment: Performed at [facility]     Studies: Ct Abdomen Pelvis Wo Contrast  07/18/2014   CLINICAL DATA:  Vomiting with weakness for 3 days. Mid abdominal pain.  EXAM: CT ABDOMEN AND PELVIS WITHOUT CONTRAST  TECHNIQUE: Multidetector CT imaging of the abdomen and pelvis was performed following the standard protocol without IV contrast.  COMPARISON:  None.  FINDINGS: The lung bases are clear.  There is a punctate nonobstructing left renal calculus. There is no ureteral or bladder calculus. No obstructive uropathy. No perinephric stranding is seen. There is a 1.9 x 2 cm hypodense, fluid attenuating left renal mass most consistent with a cyst. There is a 7 mm hyperdense left renal mass measuring 72 Hounsfield units likely representing a hemorrhagic cyst. There is a 14 x 13 mm hypodense, fluid attenuating right renal mass most consistent with a cyst. There is a similar smaller 14 mm lesion in the right posterior interpolar kidney most consistent with a cyst. The bladder is unremarkable.  The liver demonstrates no focal abnormality. The gallbladder is surgically absent. The spleen demonstrates no focal abnormality. The adrenal glands and pancreas are normal.  There is small bowel dilatation measuring up to 5 cm in diameter. There are air-fluid levels within the small bowel and colon. There is a gastric air-fluid level. There are decompressed loops of distal small bowel with a relative transition point in the anterior right mid abdomen. There is a normal caliber appendix in the right lower quadrant without periappendiceal inflammatory changes. There is no pneumoperitoneum, pneumatosis, or portal venous gas. There is no abdominal or pelvic free fluid. There is  no lymphadenopathy.  The abdominal aorta is normal in caliber with atherosclerosis.  There is degenerative disc disease throughout the lumbar spine. There is degenerative facet arthropathy throughout the lumbar spine. 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  IMPRESSION: 1. Small bowel dilatation with air-fluid levels in the stomach, small bowel and colon. Decompressed loops of distal small bowel are identified with a relative transition point in the anterior right mid abdomen. The appearance is concerning for a small bowel obstruction. 2. The air-fluid levels within the colon may be related to a colonic ileus versus diarrhea secondary to an infectious etiology.   Electronically Signed   By: Kathreen Devoid   On: 07/18/2014 18:24   Abd 1 View (kub)  07/19/2014   CLINICAL DATA:  Subsequent evaluation for abdominal distension, history of small bowel obstruction, patient had multiple bowel movements with diarrhea yesterday  EXAM: ABDOMEN - 1 VIEW  COMPARISON:  07/18/2014  FINDINGS: There are dilated small bowel similar to prior study. Dilated loops of small bowel are primarily located in the left upper quadrant and left mid abdomen. There is air throughout the colon and into the  rectum.  IMPRESSION: The findings of dilated proximal small bowel continue to be concerning for small bowel obstruction, which is likely incomplete as there remains air throughout the entire colon.   Electronically Signed   By: Skipper Cliche M.D.   On: 07/19/2014 09:22   Dg Abd Acute W/chest  07/18/2014   CLINICAL DATA:  Upper abdominal pain for 3 days with vomiting. History of hypertension. Initial encounter.  EXAM: ACUTE ABDOMEN SERIES (ABDOMEN 2 VIEW & CHEST 1 VIEW)  COMPARISON:  Chest radiographs 07/14/2007.  FINDINGS: The heart size and mediastinal contours are stable allowing for slight patient rotation to the right. There is aortic atherosclerosis and mild left basilar atelectasis or scarring. The lungs are otherwise clear. There is no  pleural effusion.  There is moderate diffuse small bowel distension with air- fluid levels at different levels on the decubitus view. The colon is relatively decompressed. There is no evidence of free intraperitoneal air. Cholecystectomy clips and mild lumbar spine degenerative changes are noted.  IMPRESSION: 1. Small bowel obstruction pattern without evidence of perforation. CT may be helpful for further evaluation. 2. No acute cardiopulmonary process.   Electronically Signed   By: Camie Patience M.D.   On: 07/18/2014 15:10    Scheduled Meds: . allopurinol  100 mg Oral Daily  . aspirin EC  81 mg Oral Daily  . fentaNYL  50 mcg Intravenous Once  . levothyroxine  25 mcg Intravenous QAC breakfast  . sodium chloride  3 mL Intravenous Q12H   Continuous Infusions: . 0.9 % NaCl with KCl 20 mEq / L 50 mL/hr at 07/19/14 1259       Time spent: 25 minutes    Harrison Zetina  [facility] Pager [phone] . If 7PM-7AM, please contact night-coverage at [email or web address], password [facility] 07/19/2014, 4:56 PM  LOS: 1 day

## 2014-07-19 NOTE — Progress Notes (Signed)
Pt with elevated HR x 91mins after going to the bathroom. 12 lead EKG performed reveling rhythm of A. Fib with RVR. Rates 110's. Pt denies SOB, palpitations, chest pain, etc. She also denies history of A. Fib. MD paged, vitals obtained, and pt is resting comfortably in the bed. Will continue to monitor.  Filed Vitals:   07/19/14 1921  BP: 119/63  Pulse: 98  Temp: 98.6 F (37 C)  Resp: 18

## 2014-07-19 NOTE — Progress Notes (Signed)
[facility] Progress Note     Subjective: Pt feels great, no N/V, abdominal pain resolved.  Less distended, having flatus and had 6 large loose BM's yesterday until midnight.  Family at bedside.    Objective: Vital signs in last 24 hours: Temp:  [98 F (36.7 C)-99 F (37.2 C)] 98.4 F (36.9 C) (01/14 0412) Pulse Rate:  [70-84] 73 (01/14 0412) Resp:  [11-20] 18 (01/14 0412) BP: (97-121)/(42-83) 107/46 mmHg (01/14 0412) SpO2:  [96 %-100 %] 97 % (01/14 0412) Weight:  [191 lb 5.8 oz (86.8 kg)] 191 lb 5.8 oz (86.8 kg) (01/13 2011) Last BM Date: 07/18/14  Intake/Output from previous day: 01/13 0701 - 01/14 0700 In: 908.3 [I.V.:908.3] Out: 600 [Urine:600] Intake/Output this shift:    PE: Gen:  Alert, NAD, pleasant Abd: Soft, NT, mild distension, +BS, no HSM, abdominal scars noted   Lab Results:   Recent Labs  07/18/14 1417 07/19/14 0330  WBC 12.1* 10.0  HGB 15.1* 11.6*  HCT 44.7 36.4  PLT 278 203   BMET  Recent Labs  07/18/14 1417 07/19/14 0330  NA 131* 134*  K 4.2 3.6  CL 92* 100  CO2 26 23  GLUCOSE 193* 103*  BUN 91* 85*  CREATININE 2.92* 2.15*  CALCIUM 9.7 8.2*   PT/INR No results for input(s): LABPROT, INR in the last 72 hours. CMP     Component Value Date/Time   NA 134* 07/19/2014 0330   K 3.6 07/19/2014 0330   CL 100 07/19/2014 0330   CO2 23 07/19/2014 0330   GLUCOSE 103* 07/19/2014 0330   BUN 85* 07/19/2014 0330   CREATININE 2.15* 07/19/2014 0330   CALCIUM 8.2* 07/19/2014 0330   PROT 6.1 07/19/2014 0330   ALBUMIN 3.6 07/19/2014 0330   AST 13 07/19/2014 0330   ALT 9 07/19/2014 0330   ALKPHOS 56 07/19/2014 0330   BILITOT 0.6 07/19/2014 0330   GFRNONAA 21* 07/19/2014 0330   GFRAA 24* 07/19/2014 0330   Lipase     Component Value Date/Time   LIPASE 20 07/18/2014 1415       Studies/Results: Ct Abdomen Pelvis Wo Contrast  07/18/2014   CLINICAL DATA:  Vomiting with weakness for 3 days. Mid abdominal pain.  EXAM: CT  ABDOMEN AND PELVIS WITHOUT CONTRAST  TECHNIQUE: Multidetector CT imaging of the abdomen and pelvis was performed following the standard protocol without IV contrast.  COMPARISON:  None.  FINDINGS: The lung bases are clear.  There is a punctate nonobstructing left renal calculus. There is no ureteral or bladder calculus. No obstructive uropathy. No perinephric stranding is seen. There is a 1.9 x 2 cm hypodense, fluid attenuating left renal mass most consistent with a cyst. There is a 7 mm hyperdense left renal mass measuring 72 Hounsfield units likely representing a hemorrhagic cyst. There is a 14 x 13 mm hypodense, fluid attenuating right renal mass most consistent with a cyst. There is a similar smaller 14 mm lesion in the right posterior interpolar kidney most consistent with a cyst. The bladder is unremarkable.  The liver demonstrates no focal abnormality. The gallbladder is surgically absent. The spleen demonstrates no focal abnormality. The adrenal glands and pancreas are normal.  There is small bowel dilatation measuring up to 5 cm in diameter. There are air-fluid levels within the small bowel and colon. There is a gastric air-fluid level. There are decompressed loops of distal small bowel with a relative transition point in the anterior right mid abdomen. There is a normal caliber  appendix in the right lower quadrant without periappendiceal inflammatory changes. There is no pneumoperitoneum, pneumatosis, or portal venous gas. There is no abdominal or pelvic free fluid. There is no lymphadenopathy.  The abdominal aorta is normal in caliber with atherosclerosis.  There is degenerative disc disease throughout the lumbar spine. There is degenerative facet arthropathy throughout the lumbar spine. 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  IMPRESSION: 1. Small bowel dilatation with air-fluid levels in the stomach, small bowel and colon. Decompressed loops of distal small bowel are identified with a relative transition point  in the anterior right mid abdomen. The appearance is concerning for a small bowel obstruction. 2. The air-fluid levels within the colon may be related to a colonic ileus versus diarrhea secondary to an infectious etiology.   Electronically Signed   By: Kathreen Devoid   On: 07/18/2014 18:24   Abd 1 View (kub)  07/19/2014   CLINICAL DATA:  Subsequent evaluation for abdominal distension, history of small bowel obstruction, patient had multiple bowel movements with diarrhea yesterday  EXAM: ABDOMEN - 1 VIEW  COMPARISON:  07/18/2014  FINDINGS: There are dilated small bowel similar to prior study. Dilated loops of small bowel are primarily located in the left upper quadrant and left mid abdomen. There is air throughout the colon and into the rectum.  IMPRESSION: The findings of dilated proximal small bowel continue to be concerning for small bowel obstruction, which is likely incomplete as there remains air throughout the entire colon.   Electronically Signed   By: Skipper Cliche M.D.   On: 07/19/2014 09:22   Dg Abd Acute W/chest  07/18/2014   CLINICAL DATA:  Upper abdominal pain for 3 days with vomiting. History of hypertension. Initial encounter.  EXAM: ACUTE ABDOMEN SERIES (ABDOMEN 2 VIEW & CHEST 1 VIEW)  COMPARISON:  Chest radiographs 07/14/2007.  FINDINGS: The heart size and mediastinal contours are stable allowing for slight patient rotation to the right. There is aortic atherosclerosis and mild left basilar atelectasis or scarring. The lungs are otherwise clear. There is no pleural effusion.  There is moderate diffuse small bowel distension with air- fluid levels at different levels on the decubitus view. The colon is relatively decompressed. There is no evidence of free intraperitoneal air. Cholecystectomy clips and mild lumbar spine degenerative changes are noted.  IMPRESSION: 1. Small bowel obstruction pattern without evidence of perforation. CT may be helpful for further evaluation. 2. No acute  cardiopulmonary process.   Electronically Signed   By: Camie Patience M.D.   On: 07/18/2014 15:10    Anti-infectives: Anti-infectives    None       Assessment/Plan SBO with history of hysterectomy for ovarian cancer, Prior cholecystectomy Hypertension Acute on chronic renal failure Arthritis S/p right knee replacement  Obesity   Plan: 1.  Continue conservative management.  She has resolved somewhat quickly, KUB improved 2.  Ambulate and IS 3.  SCD's and on aspirin 4.  Pending C.diff results 5.  With significant improvement she will not likely need surgical intervention this admission. 6.  Advance diet to clears, fulls at dinner if tolerating. 7.  Home tomorrow if tolerating solid food.   LOS: 1 day    Coralie Keens 07/19/2014, 9:57 AM Pager: [phone]

## 2014-07-20 DIAGNOSIS — I455 Other specified heart block: Secondary | ICD-10-CM

## 2014-07-20 DIAGNOSIS — I4891 Unspecified atrial fibrillation: Secondary | ICD-10-CM | POA: Diagnosis not present

## 2014-07-20 DIAGNOSIS — I10 Essential (primary) hypertension: Secondary | ICD-10-CM

## 2014-07-20 LAB — CBC
HCT: 36.4 % (ref 36.0–46.0)
Hemoglobin: 11.8 g/dL — ABNORMAL LOW (ref 12.0–15.0)
MCH: 29.8 pg (ref 26.0–34.0)
MCHC: 32.4 g/dL (ref 30.0–36.0)
MCV: 91.9 fL (ref 78.0–100.0)
Platelets: 203 10*3/uL (ref 150–400)
RBC: 3.96 MIL/uL (ref 3.87–5.11)
RDW: 14.5 % (ref 11.5–15.5)
WBC: 9.3 10*3/uL (ref 4.0–10.5)

## 2014-07-20 LAB — COMPREHENSIVE METABOLIC PANEL
ALBUMIN: 3.3 g/dL — AB (ref 3.5–5.2)
ALT: 10 U/L (ref 0–35)
AST: 16 U/L (ref 0–37)
Alkaline Phosphatase: 57 U/L (ref 39–117)
Anion gap: 2 — ABNORMAL LOW (ref 5–15)
BILIRUBIN TOTAL: 0.4 mg/dL (ref 0.3–1.2)
BUN: 52 mg/dL — ABNORMAL HIGH (ref 6–23)
CALCIUM: 8.2 mg/dL — AB (ref 8.4–10.5)
CO2: 21 mmol/L (ref 19–32)
CREATININE: 1.55 mg/dL — AB (ref 0.50–1.10)
Chloride: 112 mEq/L (ref 96–112)
GFR calc non Af Amer: 31 mL/min — ABNORMAL LOW (ref >90)
GFR, EST AFRICAN AMERICAN: 36 mL/min — AB (ref >90)
Glucose, Bld: 100 mg/dL — ABNORMAL HIGH (ref 70–99)
POTASSIUM: 4 mmol/L (ref 3.5–5.1)
Sodium: 135 mmol/L (ref 135–145)
Total Protein: 5.7 g/dL — ABNORMAL LOW (ref 6.0–8.3)

## 2014-07-20 LAB — TROPONIN I: Troponin I: <0.03 ng/mL (ref <0.031)

## 2014-07-20 LAB — GLUCOSE, CAPILLARY
GLUCOSE-CAPILLARY: 93 mg/dL (ref 70–99)
GLUCOSE-CAPILLARY: 131 mg/dL — AB (ref 70–99)
Glucose-Capillary: 89 mg/dL (ref 70–99)
Glucose-Capillary: 119 mg/dL — ABNORMAL HIGH (ref 70–99)

## 2014-07-20 MED ORDER — SODIUM CHLORIDE 0.9 % IJ SOLN
3.0000 mL | Freq: Two times a day (BID) | INTRAMUSCULAR | Status: DC
  Administered 2014-07-21 – 2014-07-25 (×6): 3 mL via INTRAVENOUS

## 2014-07-20 MED ORDER — LEVOTHYROXINE SODIUM 25 MCG PO TABS
25.0000 ug | ORAL_TABLET | ORAL | Status: DC
Start: 2014-07-21 — End: 2014-07-26
  Administered 2014-07-21 – 2014-07-25 (×4): 25 ug via ORAL
  Filled 2014-07-20 (×6): qty 1

## 2014-07-20 MED ORDER — LACTATED RINGERS IV BOLUS (SEPSIS): 1000.0000 mL | Freq: Three times a day (TID) | INTRAVENOUS | Status: AC | PRN

## 2014-07-20 MED ORDER — FUROSEMIDE 40 MG PO TABS
40.0000 mg | ORAL_TABLET | Freq: Every day | ORAL | Status: DC
  Administered 2014-07-21 – 2014-07-26 (×6): 40 mg via ORAL
  Filled 2014-07-20 (×6): qty 1

## 2014-07-20 MED ORDER — SODIUM CHLORIDE 0.9 % IJ SOLN: 3.0000 mL | INTRAMUSCULAR | Status: DC | PRN

## 2014-07-20 MED ORDER — LEVOTHYROXINE SODIUM 50 MCG PO TABS
50.0000 ug | ORAL_TABLET | ORAL | Status: DC
  Administered 2014-07-22 – 2014-07-26 (×3): 50 ug via ORAL
  Filled 2014-07-20 (×5): qty 1

## 2014-07-20 MED ORDER — SODIUM CHLORIDE 0.9 % IV SOLN: 250.0000 mL | INTRAVENOUS | Status: DC | PRN

## 2014-07-20 MED ORDER — SACCHAROMYCES BOULARDII 250 MG PO CAPS
250.0000 mg | ORAL_CAPSULE | Freq: Two times a day (BID) | ORAL | Status: DC
  Administered 2014-07-20 – 2014-07-24 (×9): 250 mg via ORAL
  Filled 2014-07-20 (×11): qty 1

## 2014-07-20 NOTE — Progress Notes (Signed)
Subjective: Still having loose stools, and AF. She has full liquids ordered but she has only had clears.  She says every time she drinks something it actuates her diarrhea.  Objective: Vital signs in last 24 hours: Temp:  [97.9 F (36.6 C)-98.6 F (37 C)] 98.3 F (36.8 C) (01/15 0407) Pulse Rate:  [71-103] 100 (01/15 0407) Resp:  [18-20] 18 (01/15 0407) BP: (119-138)/(48-71) 132/65 mmHg (01/15 0407) SpO2:  [95 %-98 %] 98 % (01/15 0407) Last BM Date: 07/19/14 Having stools, nothing PO recorded Full liquid diet Creatinine is improving;  Trop negative so far. C diff is negative but fecal lactoferrin is positive, Stool cultures are pending.    Intake/Output from previous day: 01/14 0701 - 01/15 0700 In: 600 [I.V.:600] Out: 800 [Urine:800] Intake/Output this shift: Total I/O In: -  Out: 500 [Urine:500]  General appearance: alert, cooperative and no distress GI: soft, non-tender; bowel sounds normal; no masses,  no organomegaly  Lab Results:   Recent Labs  07/19/14 0330 07/20/14 0218  WBC 10.0 9.3  HGB 11.6* 11.8*  HCT 36.4 36.4  PLT 203 203    BMET  Recent Labs  07/19/14 0330 07/20/14 0218  NA 134* 135  K 3.6 4.0  CL 100 112  CO2 23 21  GLUCOSE 103* 100*  BUN 85* 52*  CREATININE 2.15* 1.55*  CALCIUM 8.2* 8.2*   PT/INR No results for input(s): LABPROT, INR in the last 72 hours.   Recent Labs Lab 07/18/14 1417 07/19/14 0330 07/20/14 0218  AST 23 13 16   ALT 13 9 10   ALKPHOS 76 56 57  BILITOT 0.8 0.6 0.4  PROT 8.3 6.1 5.7*  ALBUMIN 4.9 3.6 3.3*     Lipase     Component Value Date/Time   LIPASE 20 07/18/2014 1415     Studies/Results: Ct Abdomen Pelvis Wo Contrast  07/18/2014   CLINICAL DATA:  Vomiting with weakness for 3 days. Mid abdominal pain.  EXAM: CT ABDOMEN AND PELVIS WITHOUT CONTRAST  TECHNIQUE: Multidetector CT imaging of the abdomen and pelvis was performed following the standard protocol without IV contrast.  COMPARISON:  None.   FINDINGS: The lung bases are clear.  There is a punctate nonobstructing left renal calculus. There is no ureteral or bladder calculus. No obstructive uropathy. No perinephric stranding is seen. There is a 1.9 x 2 cm hypodense, fluid attenuating left renal mass most consistent with a cyst. There is a 7 mm hyperdense left renal mass measuring 72 Hounsfield units likely representing a hemorrhagic cyst. There is a 14 x 13 mm hypodense, fluid attenuating right renal mass most consistent with a cyst. There is a similar smaller 14 mm lesion in the right posterior interpolar kidney most consistent with a cyst. The bladder is unremarkable.  The liver demonstrates no focal abnormality. The gallbladder is surgically absent. The spleen demonstrates no focal abnormality. The adrenal glands and pancreas are normal.  There is small bowel dilatation measuring up to 5 cm in diameter. There are air-fluid levels within the small bowel and colon. There is a gastric air-fluid level. There are decompressed loops of distal small bowel with a relative transition point in the anterior right mid abdomen. There is a normal caliber appendix in the right lower quadrant without periappendiceal inflammatory changes. There is no pneumoperitoneum, pneumatosis, or portal venous gas. There is no abdominal or pelvic free fluid. There is no lymphadenopathy.  The abdominal aorta is normal in caliber with atherosclerosis.  There is degenerative disc disease throughout the  lumbar spine. There is degenerative facet arthropathy throughout the lumbar spine. 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  IMPRESSION: 1. Small bowel dilatation with air-fluid levels in the stomach, small bowel and colon. Decompressed loops of distal small bowel are identified with a relative transition point in the anterior right mid abdomen. The appearance is concerning for a small bowel obstruction. 2. The air-fluid levels within the colon may be related to a colonic ileus versus diarrhea  secondary to an infectious etiology.   Electronically Signed   By: Kathreen Devoid   On: 07/18/2014 18:24   Abd 1 View (kub)  07/19/2014   CLINICAL DATA:  Subsequent evaluation for abdominal distension, history of small bowel obstruction, patient had multiple bowel movements with diarrhea yesterday  EXAM: ABDOMEN - 1 VIEW  COMPARISON:  07/18/2014  FINDINGS: There are dilated small bowel similar to prior study. Dilated loops of small bowel are primarily located in the left upper quadrant and left mid abdomen. There is air throughout the colon and into the rectum.  IMPRESSION: The findings of dilated proximal small bowel continue to be concerning for small bowel obstruction, which is likely incomplete as there remains air throughout the entire colon.   Electronically Signed   By: Skipper Cliche M.D.   On: 07/19/2014 09:22   Dg Abd Acute W/chest  07/18/2014   CLINICAL DATA:  Upper abdominal pain for 3 days with vomiting. History of hypertension. Initial encounter.  EXAM: ACUTE ABDOMEN SERIES (ABDOMEN 2 VIEW & CHEST 1 VIEW)  COMPARISON:  Chest radiographs 07/14/2007.  FINDINGS: The heart size and mediastinal contours are stable allowing for slight patient rotation to the right. There is aortic atherosclerosis and mild left basilar atelectasis or scarring. The lungs are otherwise clear. There is no pleural effusion.  There is moderate diffuse small bowel distension with air- fluid levels at different levels on the decubitus view. The colon is relatively decompressed. There is no evidence of free intraperitoneal air. Cholecystectomy clips and mild lumbar spine degenerative changes are noted.  IMPRESSION: 1. Small bowel obstruction pattern without evidence of perforation. CT may be helpful for further evaluation. 2. No acute cardiopulmonary process.   Electronically Signed   By: Camie Patience M.D.   On: 07/18/2014 15:10    Medications: . allopurinol  100 mg Oral Daily  . aspirin EC  81 mg Oral Daily  . fentaNYL   50 mcg Intravenous Once  . levothyroxine  25 mcg Intravenous QAC breakfast  . sodium chloride  3 mL Intravenous Q12H    Assessment/Plan SBO with history of hysterectomy for ovarian cancer, Prior cholecystectomy Hypertension Acute on chronic renal failure Arthritis S/p right knee replacement  Obesity  Episode of AF with RVR last PM   i have suggested she try the full liquid diet, and see how she does.  I don't know why she is having loose stools, but she does not appear obstructed.  No surgical issue currently.  Will defer to Medicine.      LOS: 2 days    [name] 07/20/2014

## 2014-07-20 NOTE — Consult Note (Signed)
Admit date: 07/18/2014 Referring Physician  Dr. Sanjuana Letters Primary Physician  Dr. Sherren Mocha Primary Cardiologist  None Reason for Consultation  Atrial fibrillation  HPI: [name] is a 81 y.o. female has a past medical history of Allergy; Hypertension; Arthritis; Cancer; Obesity; Hyperlipidemia; and Broken foot. She presented with  3 day hx of nausea and vomiting, having sharp abdominal pain. She has not had a BM since Sunday. No fevers or chills. Patient have had hysterectomy and cholecystectom 25 years ago due to ovarian cancer and had radiation therapy to the mesentery. CT scan was done showing Small bowel dilatation with air-fluid levels in the stomach, small bowel and colon. Decompressed loops of distal small bowel are identified with a relative transition point in the anterior right mid abdomen. The appearance is concerning for a small bowel obstruction. When patient drank her CT contrast she developed severe diarrhea up to 10 BM's. The diarrhea very loose ad watery. No blood in stool.  Yesterday afternoon she developed tachycardia with HR in the 130's and EKG showed atrial fibrillation with RVR.  She was given Lopressor 5mg  IV and HR went down to 80-132mmHg.  Around 2am this am she had a 9 second pause while sleeping.  The Cardiology fellow was called and felt it was related to beta blocker that was given earlier.  He recommended cardiology consult in the am.  She spontaneously converted to NSR at 3:50am.  Cardiology is now asked to consult for further management.  She denies any chest pain , SOB, DOE, palpitations.  She has no history of palpitations or syncope in the past.      PMH:   Past Medical History  Diagnosis Date  . Allergy   . Hypertension   . Arthritis   . Cancer     ovarian  . Obesity   . Hyperlipidemia   . Broken foot     left     PSH:   Past Surgical History  Procedure Laterality Date  . Cholecystectomy    . Total knee arthroplasty    . Cataract extraction      . Total abdominal hysterectomy w/ bilateral salpingoophorectomy      Allergies:  Shellfish allergy Prior to Admit Meds:   Prescriptions prior to admission  Medication Sig Dispense Refill Last Dose  . acetaminophen (TYLENOL) 650 MG CR tablet Take 650 mg by mouth every 8 (eight) hours as needed for pain (pain).    Past Week at Unknown time  . allopurinol (ZYLOPRIM) 100 MG tablet take 1 tablet by mouth twice a day 200 tablet 3 Past Week at Unknown time  . amLODipine-valsartan (EXFORGE) 10-160 MG per tablet Take 1 tablet by mouth daily. 100 tablet 3 Past Week at Unknown time  . aspirin 81 MG EC tablet Take 81 mg by mouth daily.     Past Week at Unknown time  . calcitRIOL (ROCALTROL) 0.25 MCG capsule Take 0.25 mcg by mouth daily.    Past Week at Unknown time  . calcium carbonate (TUMS) 500 MG chewable tablet Chew 1 tablet by mouth daily.     Past Week at Unknown time  . cetirizine (ZYRTEC) 10 MG tablet Take 10 mg by mouth daily.     Past Week at Unknown time  . furosemide (LASIX) 40 MG tablet take 1 tablet by mouth every morning 100 tablet 3 Past Week at Unknown time  . levothyroxine (SYNTHROID, LEVOTHROID) 50 MCG tablet take 1 tablet by mouth DAILY THEN 1/2 TABLET THE  NEXT DAY ROTATING 150 tablet 3 Past Week at Unknown time  . potassium chloride SA (KLOR-CON M20) 20 MEQ tablet take 1 tablet by mouth once daily 100 tablet 3 Past Week at Unknown time  . PREMARIN 0.3 MG tablet take 1 tablet by mouth once daily for 21 DAYS THEN DO NOT TAKE FOR 7 DAYS 100 tablet 2 Past Week at Unknown time  . simvastatin (ZOCOR) 20 MG tablet take 1 tablet by mouth at bedtime 100 tablet 3 Past Week at Unknown time   Fam HX:    Family History  Problem Relation Age of Onset  . Diabetes      fhx  . Hypertension      fhx  . Obesity      fhx   Social HX:    History   Social History  . Marital Status: Married    Spouse Name: N/A    Number of Children: N/A  . Years of Education: N/A   Occupational History  .  Not on file.   Social History Main Topics  . Smoking status: Never Smoker   . Smokeless tobacco: Never Used  . Alcohol Use: No  . Drug Use: No  . Sexual Activity: Not on file   Other Topics Concern  . Not on file   Social History Narrative     ROS:  All 11 ROS were addressed and are negative except what is stated in the HPI  Physical Exam: Blood pressure 133/63, pulse 67, temperature 98.8 F (37.1 C), temperature source Oral, resp. rate 18, height 5' 2.5" (1.588 m), weight 191 lb 5.8 oz (86.8 kg), SpO2 94 %.    General: Well developed, well nourished, in no acute distress Head: Eyes PERRLA, No xanthomas.   Normal cephalic and atramatic  Lungs:   Clear bilaterally to auscultation and percussion. Heart:   HRRR S1 S2 Pulses are 2+ & equal. Abdomen: Bowel sounds are positive, abdomen soft and non-tender without masses  Extremities:   No clubbing, cyanosis or edema.  DP +1 Neuro: Alert and oriented X 3. Psych:  Good affect, responds appropriately    Labs:   Lab Results  Component Value Date   WBC 9.3 07/20/2014   HGB 11.8* 07/20/2014   HCT 36.4 07/20/2014   MCV 91.9 07/20/2014   PLT 203 07/20/2014    Recent Labs Lab 07/20/14 0218  NA 135  K 4.0  CL 112  CO2 21  BUN 52*  CREATININE 1.55*  CALCIUM 8.2*  PROT 5.7*  BILITOT 0.4  ALKPHOS 57  ALT 10  AST 16  GLUCOSE 100*   No results found for: PTT Lab Results  Component Value Date   INR 1.6* 07/24/2007   INR 1.4 07/23/2007   INR 1.2 07/22/2007   Lab Results  Component Value Date   TROPONINI <0.03 07/20/2014     Lab Results  Component Value Date   CHOL 186 05/14/2014   CHOL 193 05/09/2013   CHOL 161 03/02/2012   Lab Results  Component Value Date   HDL 66.90 05/14/2014   HDL 68.30 05/09/2013   HDL 75.70 03/02/2012   Lab Results  Component Value Date   LDLCALC 93 05/14/2014   [place] 95 05/09/2013   LDLCALC 65 03/02/2012   Lab Results  Component Value Date   TRIG 132.0 05/14/2014   TRIG  147.0 05/09/2013   TRIG 104.0 03/02/2012   Lab Results  Component Value Date   CHOLHDL 3 05/14/2014   CHOLHDL 3 05/09/2013  CHOLHDL 2 03/02/2012   Lab Results  Component Value Date   LDLDIRECT 91.4 02/03/2010   LDLDIRECT 103.1 11/25/2006      Radiology:  Ct Abdomen Pelvis Wo Contrast  07/18/2014   CLINICAL DATA:  Vomiting with weakness for 3 days. Mid abdominal pain.  EXAM: CT ABDOMEN AND PELVIS WITHOUT CONTRAST  TECHNIQUE: Multidetector CT imaging of the abdomen and pelvis was performed following the standard protocol without IV contrast.  COMPARISON:  None.  FINDINGS: The lung bases are clear.  There is a punctate nonobstructing left renal calculus. There is no ureteral or bladder calculus. No obstructive uropathy. No perinephric stranding is seen. There is a 1.9 x 2 cm hypodense, fluid attenuating left renal mass most consistent with a cyst. There is a 7 mm hyperdense left renal mass measuring 72 Hounsfield units likely representing a hemorrhagic cyst. There is a 14 x 13 mm hypodense, fluid attenuating right renal mass most consistent with a cyst. There is a similar smaller 14 mm lesion in the right posterior interpolar kidney most consistent with a cyst. The bladder is unremarkable.  The liver demonstrates no focal abnormality. The gallbladder is surgically absent. The spleen demonstrates no focal abnormality. The adrenal glands and pancreas are normal.  There is small bowel dilatation measuring up to 5 cm in diameter. There are air-fluid levels within the small bowel and colon. There is a gastric air-fluid level. There are decompressed loops of distal small bowel with a relative transition point in the anterior right mid abdomen. There is a normal caliber appendix in the right lower quadrant without periappendiceal inflammatory changes. There is no pneumoperitoneum, pneumatosis, or portal venous gas. There is no abdominal or pelvic free fluid. There is no lymphadenopathy.  The abdominal aorta  is normal in caliber with atherosclerosis.  There is degenerative disc disease throughout the lumbar spine. There is degenerative facet arthropathy throughout the lumbar spine. 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  IMPRESSION: 1. Small bowel dilatation with air-fluid levels in the stomach, small bowel and colon. Decompressed loops of distal small bowel are identified with a relative transition point in the anterior right mid abdomen. The appearance is concerning for a small bowel obstruction. 2. The air-fluid levels within the colon may be related to a colonic ileus versus diarrhea secondary to an infectious etiology.   Electronically Signed   By: Kathreen Devoid   On: 07/18/2014 18:24   Abd 1 View (kub)  07/19/2014   CLINICAL DATA:  Subsequent evaluation for abdominal distension, history of small bowel obstruction, patient had multiple bowel movements with diarrhea yesterday  EXAM: ABDOMEN - 1 VIEW  COMPARISON:  07/18/2014  FINDINGS: There are dilated small bowel similar to prior study. Dilated loops of small bowel are primarily located in the left upper quadrant and left mid abdomen. There is air throughout the colon and into the rectum.  IMPRESSION: The findings of dilated proximal small bowel continue to be concerning for small bowel obstruction, which is likely incomplete as there remains air throughout the entire colon.   Electronically Signed   By: Skipper Cliche M.D.   On: 07/19/2014 09:22    EKG:  # 1 at 0205 1/15 atrial fibrillation at 89bpm             #2 at 0352 1/15 NSR with no ST changes  ASSESSMENT/PLAN:  1.  New onset atrial fibrillation with RVR in the setting of SBO with nausea and vomiting.  This was most likely triggered by acute  illness.  She is back in NSR.  Would not given any BB or CCB at this time given significant pause last night.  Will continue to monitor tele.  She most likely has tachybrady syndrome give her long pause but has not had any PAF in the past.  Her CHADS2VASC score is 4  but would not anticoagulate unless she has further episodes since this occurred in the setting of acute illness. Could consider outpt event monitor to assess for silent PAF that would then require anticoagulation long term.  TSH is normal.  Will check a 2D echo to assess LVF.  Troponin negative.  Sueanne Margarita, MD  07/20/2014  4:43 PM

## 2014-07-20 NOTE — Progress Notes (Signed)
Pt has spontaneously converted to NSR. Pt was sleeping at time of conversion. NP on call made aware, EKG confirmation performed. Will continue to monitor.  Filed Vitals:   07/20/14 0407  BP: 132/65  Pulse: 100  Temp: 98.3 F (36.8 C)  Resp: 18

## 2014-07-20 NOTE — Progress Notes (Signed)
Monitor showed pt to have a pause/asystole of 8.94 sec followed by a brief period of ST. On telemetry at this time she appears to be in Afib vs A flutter rates 100s. Pt states that she was sleeping and just waking up as staff came into the room. Pt denies CP, SOB, light headness, etc. NP on call notified and copy of strip with asystole will be placed in shadow chart. VSS and as follows  Filed Vitals:   07/20/14 0155  BP: 120/48  Pulse: 103  Temp: 98.3 F (36.8 C)  Resp: 18   Will continue to monitor patient's condition.

## 2014-07-20 NOTE — Progress Notes (Addendum)
[facility] PROGRESS NOTE  [name] [id number] DOB: July 26, 1933 DOA: 07/18/2014 PCP: Joycelyn Man, MD  Summary/subjective: Appreciate gen surgery. [name] is a very pleasant 81 y.o. female with history of Allergies; essential hypertension; Arthritis; ovarian Cancer; Obesity; Hyperlipidemia; Broken foot and CKD stage III baseline creatinine 1.9 in November 2015(followed by Dr. Jimmy Footman) will came in with 3 day history of intractable vomiting and was found to have suggestion of small bowel obstruction with CT abdomen and pelvis showing "1. Small bowel dilatation with air-fluid levels in the stomach, small bowel and colon. Decompressed loops of distal small bowel are identified with a relative transition point in the anterior right mid abdomen. The appearance is concerning for a small bowel obstruction. 2. The air-fluid levels within the colon may be related to a colonic ileus versus diarrhea secondary to an infectious etiology". She also had acute kidney injury with a BUN/CR-91/2.92, which may have been exacerbated by diuretics in setting of dehydration. She had leukocytosis of 12,000 as well as lactic acidosis raising concern for ischemia/infection. UA/chest x-ray were unremarkable. She has had multiple abdominal procedures raising possibility of adhesions. She has made good progress GI wise as she is tolerating feeding. In renal function is continuing to improve to BUN/cr-52/1.55 today. She is tolerating feeding. However, patient was noted to have episode of tachycardia last night concerning for artrial fibrillation with rapid ventricular response. She was given Lopressor IV been then developed episodes of sinus pauses, the longest one lasting 8.94 seconds. She reports episode of heavy sensation in the head at some point in the night but it is not clear whether this was related to one of the sinus pause events. She has since reverted back to sinus rhythm. Will obtain 2-D  echocardiogram and consult cardiology for further management. Plan SBO (small bowel obstruction)/ Diarrhea  Defer to general surgery.  Tolerating feeding Acute on chronic renal failure/CKD (chronic kidney disease) stage 3, GFR 30-59 ml/min/Hyponatremia/Dehydration  Bun/Cr 52/1.55  Renal function now better than baseline.  Will resume Lasix in am. Essential hypertension/sinus pause  Monitor BP and resume home meds once on regular diet.  2-D echocardiogram  Cardiology consult Code Status: Full Code. Family Communication: Spoke with daughter and son at bedside. Disposition Plan: Eventually home   Consultants:  General surgery  Cardiology  Procedures:  None  Antibiotics:  None  Objective: Filed Vitals:   07/20/14 1420  BP: 133/63  Pulse: 67  Temp: 98.8 F (37.1 C)  Resp: 18    Intake/Output Summary (Last 24 hours) at 07/20/14 1510 Last data filed at 07/20/14 1421  Gross per 24 hour  Intake   1080 ml  Output   2000 ml  Net   -920 ml   Filed Weights   07/18/14 2011  Weight: 86.8 kg (191 lb 5.8 oz)    Exam:   General:  Sitting up in chair. Not in distress.  Cardiovascular: RRR. No murmurs. S1S2 normal.  Respiratory: Lungs clear  Abdomen: Soft and nontender. Bowel sounds normal.  Musculoskeletal: No pedal edema   Data Reviewed: Basic Metabolic Panel:  Recent Labs Lab 07/18/14 1417 07/19/14 0330 07/20/14 0218  NA 131* 134* 135  K 4.2 3.6 4.0  CL 92* 100 112  CO2 26 23 21   GLUCOSE 193* 103* 100*  BUN 91* 85* 52*  CREATININE 2.92* 2.15* 1.55*  CALCIUM 9.7 8.2* 8.2*  MG  --  2.5  --   PHOS  --  4.1  --    Liver  Function Tests:  Recent Labs Lab 07/18/14 1417 07/19/14 0330 07/20/14 0218  AST 23 13 16   ALT 13 9 10   ALKPHOS 76 56 57  BILITOT 0.8 0.6 0.4  PROT 8.3 6.1 5.7*  ALBUMIN 4.9 3.6 3.3*    Recent Labs Lab 07/18/14 1415  LIPASE 20   No results for input(s): AMMONIA in the last 168 hours. CBC:  Recent Labs Lab  07/18/14 1417 07/19/14 0330 07/20/14 0218  WBC 12.1* 10.0 9.3  NEUTROABS 9.5*  --   --   HGB 15.1* 11.6* 11.8*  HCT 44.7 36.4 36.4  MCV 90.3 91.7 91.9  PLT 278 203 203   Cardiac Enzymes:  Recent Labs Lab 07/18/14 1415 07/20/14 0218  TROPONINI <0.03 <0.03   BNP (last 3 results) No results for input(s): PROBNP in the last 8760 hours. CBG:  Recent Labs Lab 07/19/14 1200 07/19/14 1458 07/19/14 2116 07/20/14 0751 07/20/14 1149  GLUCAP 64* 119* 98 89 93    Recent Results (from the past 240 hour(s))  Clostridium Difficile by PCR     Status: None   Collection Time: 07/18/14  5:54 PM  Result Value Ref Range Status   C difficile by pcr NEGATIVE NEGATIVE Final    Comment: Performed at [facility]  Stool culture     Status: None (Preliminary result)   Collection Time: 07/18/14  9:04 PM  Result Value Ref Range Status   Specimen Description PERIRECTAL  Final   Special Requests Normal  Final   Culture   Final    NO SUSPICIOUS COLONIES, CONTINUING TO HOLD Performed at [facility]    Report Status PENDING  Incomplete     Studies: Ct Abdomen Pelvis Wo Contrast  07/18/2014   CLINICAL DATA:  Vomiting with weakness for 3 days. Mid abdominal pain.  EXAM: CT ABDOMEN AND PELVIS WITHOUT CONTRAST  TECHNIQUE: Multidetector CT imaging of the abdomen and pelvis was performed following the standard protocol without IV contrast.  COMPARISON:  None.  FINDINGS: The lung bases are clear.  There is a punctate nonobstructing left renal calculus. There is no ureteral or bladder calculus. No obstructive uropathy. No perinephric stranding is seen. There is a 1.9 x 2 cm hypodense, fluid attenuating left renal mass most consistent with a cyst. There is a 7 mm hyperdense left renal mass measuring 72 Hounsfield units likely representing a hemorrhagic cyst. There is a 14 x 13 mm hypodense, fluid attenuating right renal mass most consistent with a cyst. There is a similar smaller 14 mm  lesion in the right posterior interpolar kidney most consistent with a cyst. The bladder is unremarkable.  The liver demonstrates no focal abnormality. The gallbladder is surgically absent. The spleen demonstrates no focal abnormality. The adrenal glands and pancreas are normal.  There is small bowel dilatation measuring up to 5 cm in diameter. There are air-fluid levels within the small bowel and colon. There is a gastric air-fluid level. There are decompressed loops of distal small bowel with a relative transition point in the anterior right mid abdomen. There is a normal caliber appendix in the right lower quadrant without periappendiceal inflammatory changes. There is no pneumoperitoneum, pneumatosis, or portal venous gas. There is no abdominal or pelvic free fluid. There is no lymphadenopathy.  The abdominal aorta is normal in caliber with atherosclerosis.  There is degenerative disc disease throughout the lumbar spine. There is degenerative facet arthropathy throughout the lumbar spine. 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  IMPRESSION: 1. Small bowel  dilatation with air-fluid levels in the stomach, small bowel and colon. Decompressed loops of distal small bowel are identified with a relative transition point in the anterior right mid abdomen. The appearance is concerning for a small bowel obstruction. 2. The air-fluid levels within the colon may be related to a colonic ileus versus diarrhea secondary to an infectious etiology.   Electronically Signed   By: Kathreen Devoid   On: 07/18/2014 18:24   Abd 1 View (kub)  07/19/2014   CLINICAL DATA:  Subsequent evaluation for abdominal distension, history of small bowel obstruction, patient had multiple bowel movements with diarrhea yesterday  EXAM: ABDOMEN - 1 VIEW  COMPARISON:  07/18/2014  FINDINGS: There are dilated small bowel similar to prior study. Dilated loops of small bowel are primarily located in the left upper quadrant and left mid abdomen. There is air  throughout the colon and into the rectum.  IMPRESSION: The findings of dilated proximal small bowel continue to be concerning for small bowel obstruction, which is likely incomplete as there remains air throughout the entire colon.   Electronically Signed   By: Skipper Cliche M.D.   On: 07/19/2014 09:22    Scheduled Meds: . allopurinol  100 mg Oral Daily  . aspirin EC  81 mg Oral Daily  . fentaNYL  50 mcg Intravenous Once  . [START ON 07/21/2014] furosemide  40 mg Oral Daily  . [START ON 07/21/2014] levothyroxine  25 mcg Oral Q48H   And  . [START ON 07/22/2014] levothyroxine  50 mcg Oral Q48H  . saccharomyces boulardii  250 mg Oral BID  . sodium chloride  3 mL Intravenous Q12H  . sodium chloride  3 mL Intravenous Q12H   Continuous Infusions:   Principal Problem:   SBO (small bowel obstruction) Active Problems:   Acute on chronic renal failure   Essential hypertension   CKD (chronic kidney disease) stage 3, GFR 30-59 ml/min   Sinus pause    Time spent: 15 minutes    Kelechi Astarita  [facility] Pager [phone]. If 7PM-7AM, please contact night-coverage at [email or web address], password [facility] 07/20/2014, 3:10 PM  LOS: 2 days

## 2014-07-20 NOTE — Progress Notes (Signed)
Event: Notified at approx 1915 regarding pt w/ increased HR after being assisted up to ambulate. EKG obtained and revealed A-fib w/ RVR. Rate noted in 130's sustained. Pt is asymptomatic. Remaining VSS. Order given for Metoprolol 5 mg IV x 1. After approx 1 hr down to 80-100. Pt remained asymptomatic. Troponin was ordered. At approx 0200 was notified that pt had a 8.94 sec pause while sleeping. She was awakened and noted to be asymptomatic. VSS stable. Attempted to f/u on Troponin but was told by lab that the specimen was lost. Troponin reordered. Pt discussed with Dr Posey Pronto w/ cardiology service at approx 0215  . He recommended no urgent intervention at this time and felt related to BB pt given earlier. Requested to be notified if pt becomes symptomatic or has anymore significant pauses. However, he does feel that cardiology service should be re-consulted in the morning and pt should be followed until d/c.  Have discussed pt w/ my colleague, Dr Posey Pronto w/ [facility] who agrees w/ plan. Will continue to monitor closely on telemetry.  Jeryl Columbia, NP-C [facility] Pager [phone]

## 2014-07-21 ENCOUNTER — Inpatient Hospital Stay (HOSPITAL_COMMUNITY): Payer: Medicare Other

## 2014-07-21 LAB — GLUCOSE, CAPILLARY
GLUCOSE-CAPILLARY: 94 mg/dL (ref 70–99)
GLUCOSE-CAPILLARY: 119 mg/dL — AB (ref 70–99)
Glucose-Capillary: 125 mg/dL — ABNORMAL HIGH (ref 70–99)

## 2014-07-21 MED ORDER — AMLODIPINE BESYLATE 5 MG PO TABS
5.0000 mg | ORAL_TABLET | Freq: Every day | ORAL | Status: DC
  Administered 2014-07-21 – 2014-07-26 (×6): 5 mg via ORAL
  Filled 2014-07-21 (×6): qty 1

## 2014-07-21 NOTE — Progress Notes (Signed)
[facility] PROGRESS NOTE  [name] [id number] DOB: [birth year] DOA: 07/18/2014 PCP: Joycelyn Man, MD  Summary/subjective: Appreciate gen surgery/Cardiology. [name] is a very pleasant 81 y.o. female with history of Allergies; essential hypertension; Arthritis; ovarian Cancer; Obesity; Hyperlipidemia; Broken foot and CKD stage III baseline creatinine 1.9 in November 2015(followed by Dr. Jimmy Footman) whol came in with 3 day history of intractable vomiting and was found to have suggestion of small bowel obstruction with CT abdomen and pelvis showing "1. Small bowel dilatation with air-fluid levels in the stomach, small bowel and colon. Decompressed loops of distal small bowel are identified with a relative transition point in the anterior right mid abdomen. The appearance is concerning for a small bowel obstruction. 2. The air-fluid levels within the colon may be related to a colonic ileus versus diarrhea secondary to an infectious etiology". She also had acute kidney injury on CKD stage 3 with a BUN/CR-91/2.92, which may have been exacerbated by diuretics in setting of dehydration. She had leukocytosis of 12,000 as well as lactic acidosis raising concern for ischemia/infection. UA/chest x-ray were unremarkable. She has had multiple abdominal procedures raising possibility of adhesions. She had made good progress GI wise but she vomited after a regular meal this afternoon. She has had abdominal discomfort since then. An abdominal x-ray suggested persistent dilated loops of bowel this morning. Patient was noted to have episode of tachycardia the night of 07/19/13 concerning for atrial fibrillation with rapid ventricular response. She was given Lopressor IV but then developed episodes of sinus pauses, the longest one lasting 8.94 seconds. Cardiology has been following. 2-D echocardiogram is pending. Her blood pressure is trending up once. Lasix was resumed at this morning. We'll add  amlodipine. In terms of PSBO, surgery has signed off but wonder whether the episode of vomiting this afternoon is isolated. For one, will slow her diet. If persistent symptoms, may need reimaging, NG tube placement for decompression. Patient complains of abdominal discomfort. She had a large bowel movement this evening. Plan SBO (small bowel obstruction)/ Diarrhea  Change diet to clear liquid  Reassess in a.m., may need to recall general surgery. Acute on chronic renal failure/CKD (chronic kidney disease) stage 3, GFR 30-59 ml/min/Hyponatremia/Dehydration  Bun/Cr 52/1.55  Renal function now better than baseline.  Continue Lasix  Essential hypertension/sinus pause  Add amlodipine  2-D echocardiogram pending  Follow Cardiology recommendations Code Status: Full Code. Family Communication: Spoke with daughter at bedside. Disposition Plan: Eventually home   Consultants:  General surgery  Cardiology  Procedures:  None  Antibiotics: None Objective: Filed Vitals:   07/21/14 1441  BP: 151/70  Pulse: 73  Temp: 98.3 F (36.8 C)  Resp: 20    Intake/Output Summary (Last 24 hours) at 07/21/14 1829 Last data filed at 07/21/14 1300  Gross per 24 hour  Intake    120 ml  Output      0 ml  Net    120 ml   Filed Weights   07/18/14 2011  Weight: 86.8 kg (191 lb 5.8 oz)    Exam:   General:  Sitting up. Not in distress.  Cardiovascular: S1S2 heard, no murmurs. RRR.  Respiratory: Lungs clear  Abdomen: Abdominal fullness  Musculoskeletal: No pedal Edema  Data Reviewed: Basic Metabolic Panel:  Recent Labs Lab 07/18/14 1417 07/19/14 0330 07/20/14 0218  NA 131* 134* 135  K 4.2 3.6 4.0  CL 92* 100 112  CO2 26 23 21   GLUCOSE 193* 103* 100*  BUN 91* 85* 52*  CREATININE 2.92* 2.15* 1.55*  CALCIUM 9.7 8.2* 8.2*  MG  --  2.5  --   PHOS  --  4.1  --    Liver Function Tests:  Recent Labs Lab 07/18/14 1417 07/19/14 0330 07/20/14 0218  AST 23 13 16   ALT  13 9 10   ALKPHOS 76 56 57  BILITOT 0.8 0.6 0.4  PROT 8.3 6.1 5.7*  ALBUMIN 4.9 3.6 3.3*    Recent Labs Lab 07/18/14 1415  LIPASE 20   No results for input(s): AMMONIA in the last 168 hours. CBC:  Recent Labs Lab 07/18/14 1417 07/19/14 0330 07/20/14 0218  WBC 12.1* 10.0 9.3  NEUTROABS 9.5*  --   --   HGB 15.1* 11.6* 11.8*  HCT 44.7 36.4 36.4  MCV 90.3 91.7 91.9  PLT 278 203 203   Cardiac Enzymes:  Recent Labs Lab 07/18/14 1415 07/20/14 0218  TROPONINI <0.03 <0.03   BNP (last 3 results) No results for input(s): PROBNP in the last 8760 hours. CBG:  Recent Labs Lab 07/20/14 0751 07/20/14 1149 07/20/14 1652 07/20/14 2152 07/21/14 0730  GLUCAP 89 93 131* 119* 94    Recent Results (from the past 240 hour(s))  Clostridium Difficile by PCR     Status: None   Collection Time: 07/18/14  5:54 PM  Result Value Ref Range Status   C difficile by pcr NEGATIVE NEGATIVE Final    Comment: Performed at [facility]  Stool culture     Status: None (Preliminary result)   Collection Time: 07/18/14  9:04 PM  Result Value Ref Range Status   Specimen Description PERIRECTAL  Final   Special Requests Normal  Final   Culture   Final    NO SUSPICIOUS COLONIES, CONTINUING TO HOLD Performed at [facility]    Report Status PENDING  Incomplete     Studies: Dg Abd Acute W/chest  07/21/2014   CLINICAL DATA:  Small bowel obstruction.  Dyspnea.  EXAM: ACUTE ABDOMEN SERIES (ABDOMEN 2 VIEW & CHEST 1 VIEW)  COMPARISON:  07/19/2014  FINDINGS: Mildly dilated small bowel loops are seen in the left abdomen which contain air-fluid levels. This shows no significant change compared to previous study. Some gas is seen within nondilated colon. Surgical clips again seen from prior cholecystectomy. No evidence of free air.  Heart size is normal. Both lungs are clear. No evidence of pleural effusion.  IMPRESSION: No significant change in mildly dilated small bowel loops in the left  abdomen, suspicious for partial small bowel obstruction.  No active cardiopulmonary disease.   Electronically Signed   By: Earle Gell M.D.   On: 07/21/2014 09:26    Scheduled Meds: . allopurinol  100 mg Oral Daily  . amLODipine  5 mg Oral Daily  . aspirin EC  81 mg Oral Daily  . fentaNYL  50 mcg Intravenous Once  . furosemide  40 mg Oral Daily  . levothyroxine  25 mcg Oral Q48H   And  . [START ON 07/22/2014] levothyroxine  50 mcg Oral Q48H  . saccharomyces boulardii  250 mg Oral BID  . sodium chloride  3 mL Intravenous Q12H  . sodium chloride  3 mL Intravenous Q12H   Continuous Infusions:   Principal Problem:   SBO (small bowel obstruction) Active Problems:   Acute on chronic renal failure   Essential hypertension   CKD (chronic kidney disease) stage 3, GFR 30-59 ml/min   Sinus pause   Atrial fibrillation with controlled ventricular response  Time spent: 25 minutes.    Jeriel Vivanco  [facility] Pager [phone]. If 7PM-7AM, please contact night-coverage at [email or web address], password [facility] 07/21/2014, 6:29 PM  LOS: 3 days

## 2014-07-21 NOTE — Progress Notes (Signed)
Patient ID: [name], female   DOB: 1934/02/18, 81 y.o.   MRN: [id number] [facility] Surgery Progress Note:   * No surgery found *  Subjective: Mental status is clear.  Feeling much better.  Tolerating diet.  Passing flatus and bm.  Objective: Vital signs in last 24 hours: Temp:  [98 F (36.7 C)-98.8 F (37.1 C)] 98.3 F (36.8 C) (01/16 0437) Pulse Rate:  [65-76] 76 (01/16 0437) Resp:  [18] 18 (01/16 0437) BP: (133-142)/(63-64) 142/64 mmHg (01/16 0437) SpO2:  [94 %-98 %] 97 % (01/16 0437)  Intake/Output from previous day: 01/15 0701 - 01/16 0700 In: 480 [P.O.:480] Out: 1200 [Urine:1200] Intake/Output this shift:    Physical Exam: Work of breathing is not labored.  Abdomen is nontender.    Lab Results:  Results for orders placed or performed during the hospital encounter of 07/18/14 (from the past 48 hour(s))  Glucose, capillary     Status: Abnormal   Collection Time: 07/19/14 12:00 PM  Result Value Ref Range   Glucose-Capillary 64 (L) 70 - 99 mg/dL  Glucose, capillary     Status: Abnormal   Collection Time: 07/19/14  2:58 PM  Result Value Ref Range   Glucose-Capillary 119 (H) 70 - 99 mg/dL  Glucose, capillary     Status: None   Collection Time: 07/19/14  9:16 PM  Result Value Ref Range   Glucose-Capillary 98 70 - 99 mg/dL  CBC     Status: Abnormal   Collection Time: 07/20/14  2:18 AM  Result Value Ref Range   WBC 9.3 4.0 - 10.5 K/uL   RBC 3.96 3.87 - 5.11 MIL/uL   Hemoglobin 11.8 (L) 12.0 - 15.0 g/dL   HCT 36.4 36.0 - 46.0 %   MCV 91.9 78.0 - 100.0 fL   MCH 29.8 26.0 - 34.0 pg   MCHC 32.4 30.0 - 36.0 g/dL   RDW 14.5 11.5 - 15.5 %   Platelets 203 150 - 400 K/uL  Comprehensive metabolic panel     Status: Abnormal   Collection Time: 07/20/14  2:18 AM  Result Value Ref Range   Sodium 135 135 - 145 mmol/L    Comment: Please note change in reference range.   Potassium 4.0 3.5 - 5.1 mmol/L    Comment: Please note change in reference range.   Chloride 112  96 - 112 mEq/L    Comment: DELTA CHECK NOTED   CO2 21 19 - 32 mmol/L   Glucose, Bld 100 (H) 70 - 99 mg/dL   BUN 52 (H) 6 - 23 mg/dL    Comment: DELTA CHECK NOTED   Creatinine, Ser 1.55 (H) 0.50 - 1.10 mg/dL   Calcium 8.2 (L) 8.4 - 10.5 mg/dL   Total Protein 5.7 (L) 6.0 - 8.3 g/dL   Albumin 3.3 (L) 3.5 - 5.2 g/dL   AST 16 0 - 37 U/L   ALT 10 0 - 35 U/L   Alkaline Phosphatase 57 39 - 117 U/L   Total Bilirubin 0.4 0.3 - 1.2 mg/dL   GFR calc non Af Amer 31 (L) >90 mL/min   GFR calc Af Amer 36 (L) >90 mL/min    Comment: (NOTE) The eGFR has been calculated using the CKD EPI equation. This calculation has not been validated in all clinical situations. eGFR's persistently <90 mL/min signify possible Chronic Kidney Disease.    Anion gap 2 (L) 5 - 15  Troponin I     Status: None   Collection Time: 07/20/14  2:18 AM  Result Value Ref Range   Troponin I <0.03 <0.031 ng/mL    Comment:        NO INDICATION OF MYOCARDIAL INJURY. Please note change in reference range.   Glucose, capillary     Status: None   Collection Time: 07/20/14  7:51 AM  Result Value Ref Range   Glucose-Capillary 89 70 - 99 mg/dL  Glucose, capillary     Status: None   Collection Time: 07/20/14 11:49 AM  Result Value Ref Range   Glucose-Capillary 93 70 - 99 mg/dL  Glucose, capillary     Status: Abnormal   Collection Time: 07/20/14  4:52 PM  Result Value Ref Range   Glucose-Capillary 131 (H) 70 - 99 mg/dL  Glucose, capillary     Status: Abnormal   Collection Time: 07/20/14  9:52 PM  Result Value Ref Range   Glucose-Capillary 119 (H) 70 - 99 mg/dL  Glucose, capillary     Status: None   Collection Time: 07/21/14  7:30 AM  Result Value Ref Range   Glucose-Capillary 94 70 - 99 mg/dL   Comment 1 Notify RN    Comment 2 Documented in Chart     Radiology/Results: Dg Abd Acute W/chest  07/21/2014   CLINICAL DATA:  Small bowel obstruction.  Dyspnea.  EXAM: ACUTE ABDOMEN SERIES (ABDOMEN 2 VIEW & CHEST 1 VIEW)   COMPARISON:  07/19/2014  FINDINGS: Mildly dilated small bowel loops are seen in the left abdomen which contain air-fluid levels. This shows no significant change compared to previous study. Some gas is seen within nondilated colon. Surgical clips again seen from prior cholecystectomy. No evidence of free air.  Heart size is normal. Both lungs are clear. No evidence of pleural effusion.  IMPRESSION: No significant change in mildly dilated small bowel loops in the left abdomen, suspicious for partial small bowel obstruction.  No active cardiopulmonary disease.   Electronically Signed   By: Earle Gell M.D.   On: 07/21/2014 09:26    Anti-infectives: Anti-infectives    None      Assessment/Plan: Problem List: Patient Active Problem List   Diagnosis Date Noted  . Sinus pause 07/20/2014  . Atrial fibrillation with controlled ventricular response 07/20/2014  . SBO (small bowel obstruction) 07/18/2014  . CKD (chronic kidney disease) stage 3, GFR 30-59 ml/min 07/18/2014  . Small bowel obstruction 07/18/2014  . Acute on chronic renal failure 07/18/2014  . Asthma exacerbation, non-allergic 07/03/2011  . COUGH 02/10/2010  . Hypothyroidism 02/08/2009  . Hyperlipidemia 01/19/2008  . GOUTY ARTHROPATHY 01/19/2008  . DYSPLASTIC NEVUS, BACK 06/17/2007  . Essential hypertension 12/02/2006  . Allergic rhinitis 12/02/2006  . Osteoarthritis 12/02/2006    Xrays shows the dilated loops of small bowel in the left upper quadrant.  I would treat the patient and advance diet as tolerated.  Clinically she is better and is not obstructed.  CC[facility] will be glad to see again if needed.   * No surgery found *    LOS: 3 days   Matt B. Hassell Done, MD, [facility] [phone] beeper [phone]  07/21/2014 11:15 AM

## 2014-07-21 NOTE — Progress Notes (Signed)
Subjective:  Complained of nausea and some vague epigastric pain after she ate a full lunch and then vomited.  Feels better now.  No recurrence of atrial fibrillation.  Objective:  Vital Signs in the last 24 hours: BP 151/70 mmHg  Pulse 73  Temp(Src) 98.3 F (36.8 C) (Oral)  Resp 20  Ht 5' 2.5" (1.588 m)  Wt 86.8 kg (191 lb 5.8 oz)  BMI 34.42 kg/m2  SpO2 97%  Physical Exam: Pleasant obese white female in no acute distress Lungs:  Clear  Cardiac:  Regular rhythm, normal S1 and S2, no S3 Extremities:  No edema present  Intake/Output from previous day: 01/15 0701 - 01/16 0700 In: 480 [P.O.:480] Out: 1200 [Urine:1200] Weight Filed Weights   07/18/14 2011  Weight: 86.8 kg (191 lb 5.8 oz)    Lab Results: Basic Metabolic Panel:  Recent Labs  07/19/14 0330 07/20/14 0218  NA 134* 135  K 3.6 4.0  CL 100 112  CO2 23 21  GLUCOSE 103* 100*  BUN 85* 52*  CREATININE 2.15* 1.55*    CBC:  Recent Labs  07/19/14 0330 07/20/14 0218  WBC 10.0 9.3  HGB 11.6* 11.8*  HCT 36.4 36.4  MCV 91.7 91.9  PLT 203 203   Telemetry: Currently sinus rhythm  Assessment/Plan:  1.  Paroxysmal atrial fibrillation, no recurrence. 2.  Sick sinus syndrome with long posttermination pause following resumption of sinus rhythm after conversion of atrial fibrillation.  Recommendations:  Continued observation.     Kerry Hough  MD [facility] Cardiology  07/21/2014, 4:45 PM

## 2014-07-21 NOTE — Progress Notes (Signed)
Resume pt care. Agree with previous RN assessment, will continue to monitor pt.

## 2014-07-22 ENCOUNTER — Inpatient Hospital Stay (HOSPITAL_COMMUNITY): Payer: Medicare Other

## 2014-07-22 DIAGNOSIS — I340 Nonrheumatic mitral (valve) insufficiency: Secondary | ICD-10-CM | POA: Diagnosis present

## 2014-07-22 LAB — STOOL CULTURE: Special Requests: NORMAL

## 2014-07-22 LAB — CBC
HCT: 34 % — ABNORMAL LOW (ref 36.0–46.0)
Hemoglobin: 10.9 g/dL — ABNORMAL LOW (ref 12.0–15.0)
MCH: 29.7 pg (ref 26.0–34.0)
MCHC: 32.1 g/dL (ref 30.0–36.0)
MCV: 92.6 fL (ref 78.0–100.0)
Platelets: 191 10*3/uL (ref 150–400)
RBC: 3.67 MIL/uL — ABNORMAL LOW (ref 3.87–5.11)
RDW: 14.9 % (ref 11.5–15.5)
WBC: 11.4 10*3/uL — ABNORMAL HIGH (ref 4.0–10.5)

## 2014-07-22 LAB — GLUCOSE, CAPILLARY
Glucose-Capillary: 117 mg/dL — ABNORMAL HIGH (ref 70–99)
Glucose-Capillary: 102 mg/dL — ABNORMAL HIGH (ref 70–99)
Glucose-Capillary: 103 mg/dL — ABNORMAL HIGH (ref 70–99)
Glucose-Capillary: 86 mg/dL (ref 70–99)

## 2014-07-22 LAB — BASIC METABOLIC PANEL
Anion gap: 11 (ref 5–15)
BUN: 24 mg/dL — ABNORMAL HIGH (ref 6–23)
CALCIUM: 8.9 mg/dL (ref 8.4–10.5)
CHLORIDE: 107 meq/L (ref 96–112)
CO2: 22 mmol/L (ref 19–32)
Creatinine, Ser: 1.26 mg/dL — ABNORMAL HIGH (ref 0.50–1.10)
GFR, EST AFRICAN AMERICAN: 46 mL/min — AB (ref >90)
GFR, EST NON AFRICAN AMERICAN: 40 mL/min — AB (ref >90)
GLUCOSE: 109 mg/dL — AB (ref 70–99)
Potassium: 3.6 mmol/L (ref 3.5–5.1)
Sodium: 140 mmol/L (ref 135–145)

## 2014-07-22 LAB — LIPASE, BLOOD: LIPASE: 32 U/L (ref 11–59)

## 2014-07-22 LAB — LACTIC ACID, PLASMA: LACTIC ACID, VENOUS: 1.1 mmol/L (ref 0.5–2.2)

## 2014-07-22 MED ORDER — CIPROFLOXACIN IN D5W 400 MG/200ML IV SOLN
400.0000 mg | INTRAVENOUS | Status: DC
  Administered 2014-07-22 – 2014-07-23 (×2): 400 mg via INTRAVENOUS
  Filled 2014-07-22 (×3): qty 200

## 2014-07-22 MED ORDER — METRONIDAZOLE IN NACL 5-0.79 MG/ML-% IV SOLN
500.0000 mg | Freq: Three times a day (TID) | INTRAVENOUS | Status: DC
  Administered 2014-07-22 – 2014-07-24 (×6): 500 mg via INTRAVENOUS
  Filled 2014-07-22 (×8): qty 100

## 2014-07-22 NOTE — Progress Notes (Signed)
Subjective:  Having some diarrhea now.  No recurrence of atrial fibrillation overnight.  Starting to be able to walk in the hall.  Objective:  Vital Signs in the last 24 hours: BP 141/56 mmHg  Pulse 70  Temp(Src) 98.2 F (36.8 C) (Oral)  Resp 20  Ht 5' 2.5" (1.588 m)  Wt 86.8 kg (191 lb 5.8 oz)  BMI 34.42 kg/m2  SpO2 97%  Physical Exam: Pleasant obese white female in no acute distress Lungs:  Clear  Cardiac:  Regular rhythm, normal S1 and S2, no S3 Extremities:  No edema present  Intake/Output from previous day: 01/16 0701 - 01/17 0700 In: 360 [P.O.:360] Out: -  Weight Filed Weights   07/18/14 2011  Weight: 86.8 kg (191 lb 5.8 oz)    Lab Results: Basic Metabolic Panel:  Recent Labs  07/20/14 0218 07/22/14 0554  NA 135 140  K 4.0 3.6  CL 112 107  CO2 21 22  GLUCOSE 100* 109*  BUN 52* 24*  CREATININE 1.55* 1.26*    CBC:  Recent Labs  07/20/14 0218 07/22/14 0554  WBC 9.3 11.4*  HGB 11.8* 10.9*  HCT 36.4 34.0*  MCV 91.9 92.6  PLT 203 191   Telemetry: Currently sinus rhythm  Assessment/Plan:  1.  Paroxysmal atrial fibrillation, no recurrence. 2.  Sick sinus syndrome with long posttermination pause following resumption of sinus rhythm after conversion of atrial fibrillation. 3.  Acute renal failure, improving  Recommendations:  Continue current treatments and observation. Continued observation.     Kerry Hough  MD [facility] Cardiology  07/22/2014, 8:53 AM

## 2014-07-22 NOTE — Progress Notes (Signed)
[facility] PROGRESS NOTE  [name] [id number] DOB: 08/28/1933 DOA: 07/18/2014 PCP: Joycelyn Man, MD  Summary/subjective: Appreciate Gen surgery/Cardiology. [name] is a very pleasant 81 y.o. female with history of Allergies; essential hypertension; Arthritis; ovarian Cancer; Obesity; Hyperlipidemia; Broken foot and CKD stage III baseline creatinine 1.9 in November 2015(followed by Dr. Jimmy Footman) whol came in with 3 day history of intractable vomiting and was found to have suggestion of small bowel obstruction with CT abdomen and pelvis showing "1. Small bowel dilatation with air-fluid levels in the stomach, small bowel and colon. Decompressed loops of distal small bowel are identified with a relative transition point in the anterior right mid abdomen. The appearance is concerning for a small bowel obstruction. 2. The air-fluid levels within the colon may be related to a colonic ileus versus diarrhea secondary to an infectious etiology". She also had acute kidney injury on CKD stage 3 with a BUN/CR-91/2.92, which may have been exacerbated by diuretics in setting of dehydration. She had leukocytosis of 12,000 as well as lactic acidosis raising concern for ischemia/infection. UA/chest x-ray were unremarkable. She has had multiple abdominal procedures in the past raising possibility of adhesions. She had made good progress GI wise but she vomited after a regular meal on 07/21/14. She has had some diarrhea as well. An abdominal x-ray suggested persistent dilated loops of bowel this morning. Patient's hospital stay was complicated by episode of tachycardia the night of 07/19/13 concerning for atrial fibrillation with rapid ventricular response. She was given Lopressor IV but then developed episodes of sinus pauses, the longest one lasting 8.94 seconds. Cardiology has been following. 2-D echocardiogram shows an normal ejection fraction and mild to moderate mitral regurgitation. Will  defer to cardiology regarding management of arrhythmia. Patient reports diarrhea. Given the mild leukocytosis, wonder if patient had early enteritis with partial small bowel obstruction at the time of admission. Will therefore give trial off Flagyl/ciprofloxacin and continue rest of recommendations per surgery. We'll continue clear liquid diet. Plan SBO (small bowel obstruction)/ Diarrhea  Continue clear liquid  Add Flagyl/Cipro.  Follow surgery recommendations. Acute on chronic renal failure/CKD (chronic kidney disease) stage 3, GFR 30-59 ml/min/Hyponatremia/Dehydration  Bun/Cr 24/1.26  Renal function now better than baseline.  Continue Lasix Essential hypertension/sinus pause  Continue amlodipine  Follow Cardiology recommendations Code Status: Full Code. Family Communication: Spoke with son and daughter at bedside. Disposition Plan: Eventually home   Consultants:  General surgery  Cardiology  Procedures:  None  Antibiotics: None Objective: Filed Vitals:   07/22/14 1318  BP: 139/55  Pulse: 69  Temp: 98.4 F (36.9 C)  Resp: 16    Intake/Output Summary (Last 24 hours) at 07/22/14 1500 Last data filed at 07/21/14 2300  Gross per 24 hour  Intake    240 ml  Output      0 ml  Net    240 ml   Filed Weights   07/18/14 2011  Weight: 86.8 kg (191 lb 5.8 oz)    Exam:   General:  Sitting up, not in distress.  Cardiovascular: S1S2 heard. Pulse regular.  Respiratory: Lungs clear  Abdomen: soft. +BS.  Musculoskeletal: No pedal edema.   Data Reviewed: Basic Metabolic Panel:  Recent Labs Lab 07/18/14 1417 07/19/14 0330 07/20/14 0218 07/22/14 0554  NA 131* 134* 135 140  K 4.2 3.6 4.0 3.6  CL 92* 100 112 107  CO2 26 23 21 22   GLUCOSE 193* 103* 100* 109*  BUN 91* 85* 52* 24*  CREATININE 2.92*  2.15* 1.55* 1.26*  CALCIUM 9.7 8.2* 8.2* 8.9  MG  --  2.5  --   --   PHOS  --  4.1  --   --    Liver Function Tests:  Recent Labs Lab 07/18/14 1417  07/19/14 0330 07/20/14 0218  AST 23 13 16   ALT 13 9 10   ALKPHOS 76 56 57  BILITOT 0.8 0.6 0.4  PROT 8.3 6.1 5.7*  ALBUMIN 4.9 3.6 3.3*    Recent Labs Lab 07/18/14 1415 07/22/14 1225  LIPASE 20 32   No results for input(s): AMMONIA in the last 168 hours. CBC:  Recent Labs Lab 07/18/14 1417 07/19/14 0330 07/20/14 0218 07/22/14 0554  WBC 12.1* 10.0 9.3 11.4*  NEUTROABS 9.5*  --   --   --   HGB 15.1* 11.6* 11.8* 10.9*  HCT 44.7 36.4 36.4 34.0*  MCV 90.3 91.7 91.9 92.6  PLT 278 203 203 191   Cardiac Enzymes:  Recent Labs Lab 07/18/14 1415 07/20/14 0218  TROPONINI <0.03 <0.03   BNP (last 3 results) No results for input(s): PROBNP in the last 8760 hours. CBG:  Recent Labs Lab 07/20/14 2152 07/21/14 0730 07/21/14 1200 07/21/14 1650 07/21/14 2351  GLUCAP 119* 94 119* 125* 117*    Recent Results (from the past 240 hour(s))  Clostridium Difficile by PCR     Status: None   Collection Time: 07/18/14  5:54 PM  Result Value Ref Range Status   C difficile by pcr NEGATIVE NEGATIVE Final    Comment: Performed at [facility]  Stool culture     Status: None   Collection Time: 07/18/14  9:04 PM  Result Value Ref Range Status   Specimen Description PERIRECTAL  Final   Special Requests Normal  Final   Culture   Final    NO SALMONELLA, SHIGELLA, CAMPYLOBACTER, YERSINIA, OR E.COLI 0157:[id number] ISOLATED Performed at [facility]    Report Status 07/22/2014 FINAL  Final     Studies: Dg Abd 2 Views  07/22/2014   CLINICAL DATA:  Subsequent encounter for vomiting. Small bowel obstruction.  EXAM: ABDOMEN - 2 VIEW  COMPARISON:  One day prior  FINDINGS: Upright and supine images. Supine images demonstrate persistent small bowel dilatation. Maximally 6.0 cm today versus 4.4 cm at the same level on the prior. Relative paucity of colonic gas.  Upright images demonstrate air-fluid levels throughout left-sided small bowel loops. No free intraperitoneal air.   IMPRESSION: Slight increase in partial small bowel obstruction.   Electronically Signed   By: Abigail Miyamoto M.D.   On: 07/22/2014 13:01   Dg Abd Acute W/chest  07/21/2014   CLINICAL DATA:  Small bowel obstruction.  Dyspnea.  EXAM: ACUTE ABDOMEN SERIES (ABDOMEN 2 VIEW & CHEST 1 VIEW)  COMPARISON:  07/19/2014  FINDINGS: Mildly dilated small bowel loops are seen in the left abdomen which contain air-fluid levels. This shows no significant change compared to previous study. Some gas is seen within nondilated colon. Surgical clips again seen from prior cholecystectomy. No evidence of free air.  Heart size is normal. Both lungs are clear. No evidence of pleural effusion.  IMPRESSION: No significant change in mildly dilated small bowel loops in the left abdomen, suspicious for partial small bowel obstruction.  No active cardiopulmonary disease.   Electronically Signed   By: Earle Gell M.D.   On: 07/21/2014 09:26    Scheduled Meds: . allopurinol  100 mg Oral Daily  . amLODipine  5 mg Oral Daily  .  aspirin EC  81 mg Oral Daily  . ciprofloxacin  400 mg Intravenous Q24H  . fentaNYL  50 mcg Intravenous Once  . furosemide  40 mg Oral Daily  . levothyroxine  25 mcg Oral Q48H   And  . levothyroxine  50 mcg Oral Q48H  . metronidazole  500 mg Intravenous Q8H  . saccharomyces boulardii  250 mg Oral BID  . sodium chloride  3 mL Intravenous Q12H  . sodium chloride  3 mL Intravenous Q12H   Continuous Infusions:   Principal Problem:   SBO (small bowel obstruction) Active Problems:   Acute on chronic renal failure   Essential hypertension   CKD (chronic kidney disease) stage 3, GFR 30-59 ml/min   Sinus pause   Atrial fibrillation with controlled ventricular response   Mitral regurgitation    Time spent: 30 minutes.    Deandre Stansel  [facility] Pager [phone]. If 7PM-7AM, please contact night-coverage at [email or web address], password [facility] 07/22/2014, 3:00 PM  LOS: 4 days

## 2014-07-22 NOTE — Progress Notes (Signed)
  Echocardiogram 2D Echocardiogram has been performed.  Lysle Rubens 07/22/2014, 10:34 AM

## 2014-07-22 NOTE — Progress Notes (Signed)
  Subjective: Patient ate solid diet for breakfast and lunch yesterday and then up about 5 PM vomited some of the food that she ate. No bilious vomiting. She felt bloated before then. 5 or 6 loose nonbloody stools during the night. Passing flatus. Tolerating clear liquids now without nausea vomiting or bloating. She feels fine and is asymptomatic. Ambulating in halls. Creatinine 1.26, coming down. Potassium 3.6, WBC 11,400, hemoglobin 10.9.  Objective: Vital signs in last 24 hours: Temp:  [97.8 F (36.6 C)-98.3 F (36.8 C)] 97.8 F (36.6 C) (01/17 1045) Pulse Rate:  [65-73] 65 (01/17 1045) Resp:  [18-20] 18 (01/17 1045) BP: (125-151)/(50-70) 139/50 mmHg (01/17 1045) SpO2:  [97 %-100 %] 100 % (01/17 1045) Last BM Date: 07/21/14  Intake/Output from previous day: 01/16 0701 - 01/17 0700 In: 360 [P.O.:360] Out: -  Intake/Output this shift:    General appearance: Alert. Pleasant. Oriented. Absolutely no distress. GI: soft, non-tender; bowel sounds normal; no masses,  no organomegaly  Lab Results:   Recent Labs  07/20/14 0218 07/22/14 0554  WBC 9.3 11.4*  HGB 11.8* 10.9*  HCT 36.4 34.0*  PLT 203 191   BMET  Recent Labs  07/20/14 0218 07/22/14 0554  NA 135 140  K 4.0 3.6  CL 112 107  CO2 21 22  GLUCOSE 100* 109*  BUN 52* 24*  CREATININE 1.55* 1.26*  CALCIUM 8.2* 8.9   PT/INR No results for input(s): LABPROT, INR in the last 72 hours. ABG No results for input(s): PHART, HCO3 in the last 72 hours.  Invalid input(s): PCO2, PO2  Studies/Results: Dg Abd Acute W/chest  07/21/2014   CLINICAL DATA:  Small bowel obstruction.  Dyspnea.  EXAM: ACUTE ABDOMEN SERIES (ABDOMEN 2 VIEW & CHEST 1 VIEW)  COMPARISON:  07/19/2014  FINDINGS: Mildly dilated small bowel loops are seen in the left abdomen which contain air-fluid levels. This shows no significant change compared to previous study. Some gas is seen within nondilated colon. Surgical clips again seen from prior  cholecystectomy. No evidence of free air.  Heart size is normal. Both lungs are clear. No evidence of pleural effusion.  IMPRESSION: No significant change in mildly dilated small bowel loops in the left abdomen, suspicious for partial small bowel obstruction.  No active cardiopulmonary disease.   Electronically Signed   By: Earle Gell M.D.   On: 07/21/2014 09:26    Anti-infectives: Anti-infectives    None      Assessment/Plan:  Recurrent nausea and vomiting. Nonbilious. Also having diarrhea. C. difficile negative.   it is not clear whether she has a partial SBO or some type of enteritis, infectious or radiation related. Since she is clinically asymptomatic and her abdominal exam is completely benign, there is no indication for surgical intervention. No indication for nasogastric tube. Continue clear liquid diet so long as she is asymptomatic. Check lactic acid and lipase, just to be sure. Probably normal.  check belly films to make sure small bowel loops left upper quadrant are no worse.  History of hysterectomy for GYN cancer and postop radiation therapy Radiation enteritis remains in the differential diagnosis  History laparoscopic cholecystectomy  Acute renal failure, improving Hypertension Obesity     LOS: 4 days    Miley Blanchett M 07/22/2014

## 2014-07-23 LAB — GLUCOSE, CAPILLARY
GLUCOSE-CAPILLARY: 87 mg/dL (ref 70–99)
GLUCOSE-CAPILLARY: 104 mg/dL — AB (ref 70–99)
GLUCOSE-CAPILLARY: 97 mg/dL (ref 70–99)

## 2014-07-23 LAB — BASIC METABOLIC PANEL
ANION GAP: 8 (ref 5–15)
BUN: 19 mg/dL (ref 6–23)
CO2: 25 mmol/L (ref 19–32)
Calcium: 8.4 mg/dL (ref 8.4–10.5)
Chloride: 108 mEq/L (ref 96–112)
Creatinine, Ser: 1.3 mg/dL — ABNORMAL HIGH (ref 0.50–1.10)
GFR, EST AFRICAN AMERICAN: 44 mL/min — AB (ref >90)
GFR, EST NON AFRICAN AMERICAN: 38 mL/min — AB (ref >90)
GLUCOSE: 98 mg/dL (ref 70–99)
Potassium: 3.3 mmol/L — ABNORMAL LOW (ref 3.5–5.1)
Sodium: 141 mmol/L (ref 135–145)

## 2014-07-23 LAB — CBC
HCT: 32.8 % — ABNORMAL LOW (ref 36.0–46.0)
Hemoglobin: 10.6 g/dL — ABNORMAL LOW (ref 12.0–15.0)
MCH: 29.6 pg (ref 26.0–34.0)
MCHC: 32.3 g/dL (ref 30.0–36.0)
MCV: 91.6 fL (ref 78.0–100.0)
Platelets: 178 10*3/uL (ref 150–400)
RBC: 3.58 MIL/uL — AB (ref 3.87–5.11)
RDW: 14.6 % (ref 11.5–15.5)
WBC: 11.5 10*3/uL — AB (ref 4.0–10.5)

## 2014-07-23 MED ORDER — POTASSIUM CHLORIDE CRYS ER 20 MEQ PO TBCR
40.0000 meq | EXTENDED_RELEASE_TABLET | Freq: Once | ORAL | Status: AC
  Administered 2014-07-23: 40 meq via ORAL
  Filled 2014-07-23: qty 2

## 2014-07-23 MED ORDER — POTASSIUM CHLORIDE 20 MEQ/15ML (10%) PO SOLN: 40.0000 meq | Freq: Once | ORAL | Status: DC

## 2014-07-23 NOTE — Progress Notes (Signed)
Subjective:  No complaints today.  No recurrence of atrial fibrillation or pauses. Able to walk in the hall.  Objective:  Vital Signs in the last 24 hours: BP 129/54 mmHg  Pulse 64  Temp(Src) 98.4 F (36.9 C) (Oral)  Resp 18  Ht 5' 2.5" (1.588 m)  Wt 191 lb 5.8 oz (86.8 kg)  BMI 34.42 kg/m2  SpO2 98%  Physical Exam: Pleasant obese white female in no acute distress Lungs:  Clear  Cardiac:  Regular rhythm, normal S1 and S2, no S3 Extremities:  No edema present  Intake/Output from previous day: 01/17 0701 - 01/18 0700 In: -  Out: 1050 [Urine:1050] Weight Filed Weights   07/18/14 2011  Weight: 191 lb 5.8 oz (86.8 kg)    Lab Results: Basic Metabolic Panel:  Recent Labs  07/22/14 0554 07/23/14 0455  NA 140 141  K 3.6 3.3*  CL 107 108  CO2 22 25  GLUCOSE 109* 98  BUN 24* 19  CREATININE 1.26* 1.30*    CBC:  Recent Labs  07/22/14 0554 07/23/14 0455  WBC 11.4* 11.5*  HGB 10.9* 10.6*  HCT 34.0* 32.8*  MCV 92.6 91.6  PLT 191 178   Telemetry: Currently sinus rhythm  Assessment/Plan:  1.  Paroxysmal atrial fibrillation, no recurrence. 2.  Sick sinus syndrome with long posttermination pause following resumption of sinus rhythm after conversion of atrial fibrillation. 3.  Acute renal failure, improving 4. SBO improving.   Recommendations:  Continue current treatments and observation. I will sign off now. Please call with any questions.  Nikelle Malatesta [place] MD, [facility]    07/23/2014, 8:09 AM

## 2014-07-23 NOTE — Progress Notes (Signed)
[facility] PROGRESS NOTE  [name] [id number] DOB: [birth year] DOA: 07/18/2014 PCP: Joycelyn Man, MD  Summary/subjective: Appreciate Gen surgery/Cardiology. [name] is a very pleasant 81 y.o. female with history of Allergies; essential hypertension; Arthritis; ovarian Cancer; Obesity; Hyperlipidemia; Broken foot and CKD stage III baseline creatinine 1.9 in November 2015(followed by Dr. Jimmy Footman) whol came in with 3 day history of intractable vomiting and was found to have suggestion of small bowel obstruction with CT abdomen and pelvis showing "1. Small bowel dilatation with air-fluid levels in the stomach, small bowel and colon. Decompressed loops of distal small bowel are identified with a relative transition point in the anterior right mid abdomen. The appearance is concerning for a small bowel obstruction. 2. The air-fluid levels within the colon may be related to a colonic ileus versus diarrhea secondary to an infectious etiology". She also had acute kidney injury on CKD stage 3 with a BUN/CR-91/2.92, which may have been exacerbated by diuretics in setting of dehydration. She had leukocytosis of 12,000 as well as lactic acidosis raising concern for ischemia/infection. UA/chest x-ray were unremarkable. She has had multiple abdominal procedures in the past raising possibility of adhesions. She had made good progress GI wise but she vomited after a regular meal on 07/21/14. She has had diarrhea as well. An abdominal x-ray suggested persistent dilated loops of bowel on 07/22/14. Patient's hospital stay was complicated by episode of tachycardia the night of 07/19/13 concerning for atrial fibrillation with rapid ventricular response. She was given Lopressor IV but then developed episodes of sinus pauses, the longest one lasting 8.94 seconds. Cardiology has signed off with the recommendation to continue observation. 2-D echocardiogram shows an normal ejection fraction and mild to  moderate mitral regurgitation. A trial of Flagyl/ciprofloxacin was initiated yesterday as there was question of enteritis. Patient feels like diarrhea is worse. Will consult to GI regarding diarrhea. Deferred to surgery for management of PSBO Plan SBO (small bowel obstruction)/ Diarrhea  Advance diet as tolerated by patient  Day2 Flagyl/Cipro.  Follow surgery recommendations. Acute on chronic renal failure/CKD (chronic kidney disease) stage 3, GFR 30-59 ml/min/Hyponatremia/Dehydration  Bun/Cr 24/1.3  Renal function now better than baseline.  Continue Lasix. Replenish potassium Essential hypertension/sinus pause  Continue amlodipine  Follow Cardiology recommendations Code Status: Full Code. Family Communication: Spoke with son and daughter at bedside. Disposition Plan: Eventually home   Consultants:  General surgery  Cardiology  GI- spoke with Mickel Baas who will graciously see patient in consult in a.m.  Procedures:  None  Antibiotics: None Objective: Filed Vitals:   07/23/14 1354  BP: 126/57  Pulse: 65  Temp: 97.8 F (36.6 C)  Resp: 16    Intake/Output Summary (Last 24 hours) at 07/23/14 1846 Last data filed at 07/23/14 1400  Gross per 24 hour  Intake    600 ml  Output   1550 ml  Net   -950 ml   Filed Weights   07/18/14 2011  Weight: 86.8 kg (191 lb 5.8 oz)    Exam:   General:  Not too happy today  Cardiovascular: RRR. No murmurs. S1S2 normal.   Respiratory: Lungs clear  Abdomen: Abdominal fullness  Musculoskeletal: No pedal edema  Data Reviewed: Basic Metabolic Panel:  Recent Labs Lab 07/18/14 1417 07/19/14 0330 07/20/14 0218 07/22/14 0554 07/23/14 0455  NA 131* 134* 135 140 141  K 4.2 3.6 4.0 3.6 3.3*  CL 92* 100 112 107 108  CO2 26 23 21 22 25   GLUCOSE 193* 103* 100* 109*  98  BUN 91* 85* 52* 24* 19  CREATININE 2.92* 2.15* 1.55* 1.26* 1.30*  CALCIUM 9.7 8.2* 8.2* 8.9 8.4  MG  --  2.5  --   --   --   PHOS  --  4.1  --   --    --    Liver Function Tests:  Recent Labs Lab 07/18/14 1417 07/19/14 0330 07/20/14 0218  AST 23 13 16   ALT 13 9 10   ALKPHOS 76 56 57  BILITOT 0.8 0.6 0.4  PROT 8.3 6.1 5.7*  ALBUMIN 4.9 3.6 3.3*    Recent Labs Lab 07/18/14 1415 07/22/14 1225  LIPASE 20 32   No results for input(s): AMMONIA in the last 168 hours. CBC:  Recent Labs Lab 07/18/14 1417 07/19/14 0330 07/20/14 0218 07/22/14 0554 07/23/14 0455  WBC 12.1* 10.0 9.3 11.4* 11.5*  NEUTROABS 9.5*  --   --   --   --   HGB 15.1* 11.6* 11.8* 10.9* 10.6*  HCT 44.7 36.4 36.4 34.0* 32.8*  MCV 90.3 91.7 91.9 92.6 91.6  PLT 278 203 203 191 178   Cardiac Enzymes:  Recent Labs Lab 07/18/14 1415 07/20/14 0218  TROPONINI <0.03 <0.03   BNP (last 3 results) No results for input(s): PROBNP in the last 8760 hours. CBG:  Recent Labs Lab 07/21/14 1650 07/21/14 2351 07/22/14 0749 07/22/14 1144 07/22/14 1656  GLUCAP 125* 117* 86 102* 103*    Recent Results (from the past 240 hour(s))  Clostridium Difficile by PCR     Status: None   Collection Time: 07/18/14  5:54 PM  Result Value Ref Range Status   C difficile by pcr NEGATIVE NEGATIVE Final    Comment: Performed at [facility]  Stool culture     Status: None   Collection Time: 07/18/14  9:04 PM  Result Value Ref Range Status   Specimen Description PERIRECTAL  Final   Special Requests Normal  Final   Culture   Final    NO SALMONELLA, SHIGELLA, CAMPYLOBACTER, YERSINIA, OR E.COLI 0157:H7 ISOLATED Performed at [facility]    Report Status 07/22/2014 FINAL  Final     Studies: Dg Abd 2 Views  07/22/2014   CLINICAL DATA:  Subsequent encounter for vomiting. Small bowel obstruction.  EXAM: ABDOMEN - 2 VIEW  COMPARISON:  One day prior  FINDINGS: Upright and supine images. Supine images demonstrate persistent small bowel dilatation. Maximally 6.0 cm today versus 4.4 cm at the same level on the prior. Relative paucity of colonic gas.  Upright  images demonstrate air-fluid levels throughout left-sided small bowel loops. No free intraperitoneal air.  IMPRESSION: Slight increase in partial small bowel obstruction.   Electronically Signed   By: Abigail Miyamoto M.D.   On: 07/22/2014 13:01    Scheduled Meds: . allopurinol  100 mg Oral Daily  . amLODipine  5 mg Oral Daily  . aspirin EC  81 mg Oral Daily  . ciprofloxacin  400 mg Intravenous Q24H  . fentaNYL  50 mcg Intravenous Once  . furosemide  40 mg Oral Daily  . levothyroxine  25 mcg Oral Q48H   And  . levothyroxine  50 mcg Oral Q48H  . metronidazole  500 mg Intravenous Q8H  . saccharomyces boulardii  250 mg Oral BID  . sodium chloride  3 mL Intravenous Q12H  . sodium chloride  3 mL Intravenous Q12H   Continuous Infusions:   Principal Problem:   SBO (small bowel obstruction) Active Problems:   Acute on  chronic renal failure   Essential hypertension   CKD (chronic kidney disease) stage 3, GFR 30-59 ml/min   Sinus pause   Atrial fibrillation with controlled ventricular response   Mitral regurgitation    Time spent: 20 minutes.    Aleksandar Duve  [facility] Pager [phone]. If 7PM-7AM, please contact night-coverage at [email or web address], password [email or web address] 07/23/2014, 6:46 PM  LOS: 5 days

## 2014-07-23 NOTE — Progress Notes (Signed)
  Subjective: Patient has tolerated clear liquids.  Had a BM this AM.  Objective: Vital signs in last 24 hours: Temp:  [98.4 F (36.9 C)-98.5 F (36.9 C)] 98.4 F (36.9 C) (01/18 0443) Pulse Rate:  [64-67] 67 (01/18 0925) Resp:  [18] 18 (01/18 0443) BP: (108-134)/(51-63) 108/51 mmHg (01/18 0925) SpO2:  [97 %-98 %] 98 % (01/18 0443) Last BM Date: 07/23/14  Intake/Output from previous day: 2022/08/07 0701 - 01/18 0700 In: -  Out: 1050 [Urine:1050] Intake/Output this shift: Total I/O In: 600 [P.O.:600] Out: 200 [Urine:200]  General appearance: Alert. Pleasant. Oriented. Absolutely no distress. GI: soft, non-tender; bowel sounds normal; no masses,  no organomegaly  Lab Results:   Recent Labs  08-07-14 0554 07/23/14 0455  WBC 11.4* 11.5*  HGB 10.9* 10.6*  HCT 34.0* 32.8*  PLT 191 178   BMET  Recent Labs  08-07-14 0554 07/23/14 0455  NA 140 141  K 3.6 3.3*  CL 107 108  CO2 22 25  GLUCOSE 109* 98  BUN 24* 19  CREATININE 1.26* 1.30*  CALCIUM 8.9 8.4   PT/INR No results for input(s): LABPROT, INR in the last 72 hours. ABG No results for input(s): PHART, HCO3 in the last 72 hours.  Invalid input(s): PCO2, PO2  Studies/Results: Dg Abd 2 Views  08/07/2014   CLINICAL DATA:  Subsequent encounter for vomiting. Small bowel obstruction.  EXAM: ABDOMEN - 2 VIEW  COMPARISON:  One day prior  FINDINGS: Upright and supine images. Supine images demonstrate persistent small bowel dilatation. Maximally 6.0 cm today versus 4.4 cm at the same level on the prior. Relative paucity of colonic gas.  Upright images demonstrate air-fluid levels throughout left-sided small bowel loops. No free intraperitoneal air.  IMPRESSION: Slight increase in partial small bowel obstruction.   Electronically Signed   By: Abigail Miyamoto M.D.   On: 07-Aug-2014 13:01    Anti-infectives: Anti-infectives    Start     Dose/Rate Route Frequency Ordered Stop   2014-08-07 1600  metroNIDAZOLE (FLAGYL) IVPB 500 mg      500 mg100 mL/hr over 60 Minutes Intravenous Every 8 hours 08/07/14 1457     August 07, 2014 1600  ciprofloxacin (CIPRO) IVPB 400 mg     400 mg200 mL/hr over 60 Minutes Intravenous Every 24 hours 2014-08-07 1457        Assessment/Plan:  Recurrent nausea and vomiting. Nonbilious. Also having diarrhea. C. difficile negative.   it is not clear whether she has a partial SBO or some type of enteritis, infectious or radiation related. Since she is clinically asymptomatic and her abdominal exam is completely benign, there is no indication for surgical intervention. Will advance full liquids and recheck AXR in AM.  History of hysterectomy for GYN cancer and postop radiation therapy Radiation enteritis remains in the differential diagnosis  History laparoscopic cholecystectomy  Acute renal failure, improving Hypertension Obesity     LOS: 5 days    Macaiah Mangal C. [id number]

## 2014-07-24 ENCOUNTER — Encounter: Payer: Self-pay | Admitting: Internal Medicine

## 2014-07-24 ENCOUNTER — Inpatient Hospital Stay (HOSPITAL_COMMUNITY): Payer: Medicare Other

## 2014-07-24 DIAGNOSIS — R933 Abnormal findings on diagnostic imaging of other parts of digestive tract: Secondary | ICD-10-CM

## 2014-07-24 LAB — BASIC METABOLIC PANEL
Anion gap: 9 (ref 5–15)
BUN: 15 mg/dL (ref 6–23)
CALCIUM: 8.7 mg/dL (ref 8.4–10.5)
CO2: 22 mmol/L (ref 19–32)
CREATININE: 1.28 mg/dL — AB (ref 0.50–1.10)
Chloride: 107 mEq/L (ref 96–112)
GFR calc Af Amer: 45 mL/min — ABNORMAL LOW (ref >90)
GFR calc non Af Amer: 39 mL/min — ABNORMAL LOW (ref >90)
Glucose, Bld: 88 mg/dL (ref 70–99)
POTASSIUM: 3.6 mmol/L (ref 3.5–5.1)
Sodium: 138 mmol/L (ref 135–145)

## 2014-07-24 LAB — CBC
HEMATOCRIT: 33.9 % — AB (ref 36.0–46.0)
Hemoglobin: 11 g/dL — ABNORMAL LOW (ref 12.0–15.0)
MCH: 29.6 pg (ref 26.0–34.0)
MCHC: 32.4 g/dL (ref 30.0–36.0)
MCV: 91.4 fL (ref 78.0–100.0)
PLATELETS: 194 10*3/uL (ref 150–400)
RBC: 3.71 MIL/uL — AB (ref 3.87–5.11)
RDW: 14.8 % (ref 11.5–15.5)
WBC: 10.9 10*3/uL — ABNORMAL HIGH (ref 4.0–10.5)

## 2014-07-24 LAB — SEDIMENTATION RATE: SED RATE: 12 mm/h (ref 0–22)

## 2014-07-24 LAB — MAGNESIUM: MAGNESIUM: 1.5 mg/dL (ref 1.5–2.5)

## 2014-07-24 LAB — CLOSTRIDIUM DIFFICILE BY PCR: CDIFFPCR: NEGATIVE

## 2014-07-24 MED ORDER — SODIUM CHLORIDE 0.9 % IV SOLN: 250.0000 mL | INTRAVENOUS | Status: DC | PRN

## 2014-07-24 NOTE — Progress Notes (Signed)
Subjective: Tolerating full liquid diet. Asymptomatic. Denies nausea. Had loose stools last night. Stool cultures and C. difficile negative Remains on Cipro and Flagyl, this can probably be discontinued  Abdominal x-rays show significant improvement. Lots of gas in the colon. Much less small bowel gas. WBC 10,900. Potassium 3.6.  Objective: Vital signs in last 24 hours: Temp:  [97.8 F (36.6 C)-98.4 F (36.9 C)] 98.2 F (36.8 C) 2022/08/05 0533) Pulse Rate:  [63-68] 63 08/05/2022 0533) Resp:  [16-18] 18 08-05-2022 0533) BP: (123-139)/(57-61) 128/59 mmHg 08/05/22 0533) SpO2:  [96 %-99 %] 98 % 08/05/22 0533) Last BM Date: 08/05/2014  Intake/Output from previous day: 01/18 0701 - 05-Aug-2022 0700 In: 800 [P.O.:600; IV Piggyback:200] Out: 800 [Urine:800] Intake/Output this shift:    General appearance: Alert. Cooperative. Pleasant. Good spirits. No distress. Oriented. Daughter present. GI: soft, non-tender; bowel sounds normal; no masses,  no organomegaly  Lab Results:   Recent Labs  07/23/14 0455 08/05/2014 0452  WBC 11.5* 10.9*  HGB 10.6* 11.0*  HCT 32.8* 33.9*  PLT 178 194   BMET  Recent Labs  07/23/14 0455 08/05/2014 0452  NA 141 138  K 3.3* 3.6  CL 108 107  CO2 25 22  GLUCOSE 98 88  BUN 19 15  CREATININE 1.30* 1.28*  CALCIUM 8.4 8.7   PT/INR No results for input(s): LABPROT, INR in the last 72 hours. ABG No results for input(s): PHART, HCO3 in the last 72 hours.  Invalid input(s): PCO2, PO2  Studies/Results: Dg Abd 2 Views  August 05, 2014   CLINICAL DATA:  Followup small bowel obstruction. Diarrhea all last night. No vomiting since Saturday 07/21/2014.  EXAM: ABDOMEN - 2 VIEW  COMPARISON:  07/22/2014  FINDINGS: Bowel gas pattern demonstrates continued dilated air-filled small bowel loops in the left abdomen which had decreased in diameter measuring approximately 4.7 cm (previously 6 cm). Few scattered nonspecific air-fluid levels over the right abdomen. Less number of dilated  air-filled small bowel loops. No free peritoneal air. Moderate air within the right colon. Remainder of the exam is unchanged.  IMPRESSION: Interval improvement in patient's partial small bowel obstructive process.   Electronically Signed   By: Marin Olp M.D.   On: August 05, 2014 09:41   Dg Abd 2 Views  07/22/2014   CLINICAL DATA:  Subsequent encounter for vomiting. Small bowel obstruction.  EXAM: ABDOMEN - 2 VIEW  COMPARISON:  One day prior  FINDINGS: Upright and supine images. Supine images demonstrate persistent small bowel dilatation. Maximally 6.0 cm today versus 4.4 cm at the same level on the prior. Relative paucity of colonic gas.  Upright images demonstrate air-fluid levels throughout left-sided small bowel loops. No free intraperitoneal air.  IMPRESSION: Slight increase in partial small bowel obstruction.   Electronically Signed   By: Abigail Miyamoto M.D.   On: 07/22/2014 13:01    Anti-infectives: Anti-infectives    Start     Dose/Rate Route Frequency Ordered Stop   07/22/14 1600  metroNIDAZOLE (FLAGYL) IVPB 500 mg     500 mg100 mL/hr over 60 Minutes Intravenous Every 8 hours 07/22/14 1457     07/22/14 1600  ciprofloxacin (CIPRO) IVPB 400 mg     400 mg200 mL/hr over 60 Minutes Intravenous Every 24 hours 07/22/14 1457        Assessment/Plan:   Recurrent nausea and vomiting. Nonbilious. Also having diarrhea. C. difficile negative. Stool cultures pending. Resolved and clinically doing well. Advance to soft diet Home 7.  Recommend discontinue Cipro and Flagyl, unless there is a  clear indication.  it is not clear whether she has a partial SBO or some type of enteritis, infectious or radiation related. Resolving, regardless.   History of hysterectomy for GYN cancer and postop radiation therapy Radiation enteritis remains in the differential diagnosis  History laparoscopic cholecystectomy  Acute renal failure, improving Hypertension Obesity   LOS: 6 days    [name]  M 07/24/2014

## 2014-07-24 NOTE — Consult Note (Signed)
Referring Provider: [facility] Primary Care Physician:  Joycelyn Man, MD Primary Gastroenterologist:  Dr. Olevia Perches  Reason for Consultation:  diarrhea      HPI: [name] is a 81 y.o. female admitted to hospital 07/18/14 with nausea, vomiting and abdominal pain. She has a his history of having had a hysterectomy 25 years ago due to ovarian cancer. This was followed with radiation therapy. She has also had a cholecystectomy. Patient states that on January 11 she started to have stabbing, crampy type diffuse abdominal pains more pronounced in the upper abdomen. She had 2 days of projectile vomiting after which her children decided to bring her to the hospital. CT scan was done showing small bowel dilation with air-fluid levels in the stomach, small bowel, and colon decompressed loops of distal small bowel were identified with a transition point in the anterior right mid abdomen. Patient says she was not having diarrhea at home but that her diarrhea started after she had the IV contrast. She reports she is having 10-12 watery bowel movements. She has no bright red blood per rectum or melena. She was admitted and placed on IV fluids with bowel rest. Repeat abdominal films have showed slow resolution of patient's small bowel obstructive process. Patient had a colonoscopy in 1993 with removal of a polyp. She had a surveillance colonoscopy in February 1999 at which time a cecal polyp was removed. Her last colonoscopy was in February 2000 at which time there was no evidence for recurrent polyps and she was noted to have hemorrhoids. Patient denies having any prior bouts of small bowel obstruction prior to this admission.Denies antibiotics prior to admission, denies recent travel outside of [place].   During this admission, patient has also had new onset atrial fibrillation with rapid ventricular response. This was felt to be triggered by her acute illness with small bowel obstruction, nausea,   vomiting, and diarrhea. She is back in normal sinus rhythm she is currently not anticoagulated. TSH is normal. Troponins are negative. Echocardiogram showed left ventricular cavity size was normal systolic function normal ejection fraction was in the range of 55-60%. Mitral valve had mild to moderate regurgitation.   Past Medical History  Diagnosis Date  . Allergy   . Hypertension   . Arthritis   . Cancer     ovarian  . Obesity   . Hyperlipidemia   . Broken foot     left    Past Surgical History  Procedure Laterality Date  . Cholecystectomy    . Total knee arthroplasty    . Cataract extraction    . Total abdominal hysterectomy w/ bilateral salpingoophorectomy      Prior to Admission medications   Medication Sig Start Date End Date Taking? Authorizing Provider  acetaminophen (TYLENOL) 650 MG CR tablet Take 650 mg by mouth every 8 (eight) hours as needed for pain (pain).    Yes Historical Provider, MD  allopurinol (ZYLOPRIM) 100 MG tablet take 1 tablet by mouth twice a day 05/21/14  Yes Dorena Cookey, MD  amLODipine-valsartan (EXFORGE) 10-160 MG per tablet Take 1 tablet by mouth daily. 05/21/14  Yes Dorena Cookey, MD  aspirin 81 MG EC tablet Take 81 mg by mouth daily.     Yes Historical Provider, MD  calcitRIOL (ROCALTROL) 0.25 MCG capsule Take 0.25 mcg by mouth daily.  01/22/11  Yes Historical Provider, MD  calcium carbonate (TUMS) 500 MG chewable tablet Chew 1 tablet by mouth daily.     Yes Historical  Provider, MD  cetirizine (ZYRTEC) 10 MG tablet Take 10 mg by mouth daily.     Yes Historical Provider, MD  furosemide (LASIX) 40 MG tablet take 1 tablet by mouth every morning 05/21/14  Yes Dorena Cookey, MD  levothyroxine (SYNTHROID, LEVOTHROID) 50 MCG tablet take 1 tablet by mouth DAILY THEN 1/2 TABLET THE NEXT DAY ROTATING 05/21/14  Yes Dorena Cookey, MD  potassium chloride SA (KLOR-CON M20) 20 MEQ tablet take 1 tablet by mouth once daily 05/21/14  Yes Dorena Cookey, MD    PREMARIN 0.3 MG tablet take 1 tablet by mouth once daily for 21 DAYS THEN DO NOT TAKE FOR 7 DAYS 06/07/14  Yes Dorena Cookey, MD  simvastatin (ZOCOR) 20 MG tablet take 1 tablet by mouth at bedtime 05/21/14  Yes Dorena Cookey, MD    Current Facility-Administered Medications  Medication Dose Route Frequency Provider Last Rate Last Dose  . 0.9 %  sodium chloride infusion  250 mL Intravenous PRN Michael Boston, MD      . acetaminophen (TYLENOL) tablet 650 mg  650 mg Oral Q6H PRN Toy Baker, MD       Or  . acetaminophen (TYLENOL) suppository 650 mg  650 mg Rectal Q6H PRN Toy Baker, MD      . allopurinol (ZYLOPRIM) tablet 100 mg  100 mg Oral Daily Toy Baker, MD   100 mg at 07/23/14 0928  . amLODipine (NORVASC) tablet 5 mg  5 mg Oral Daily Simbiso Ranga, MD   5 mg at 07/23/14 0928  . aspirin EC tablet 81 mg  81 mg Oral Daily Toy Baker, MD   81 mg at 07/23/14 0928  . ciprofloxacin (CIPRO) IVPB 400 mg  400 mg Intravenous Q24H Simbiso Ranga, MD   400 mg at 07/23/14 1526  . fentaNYL (SUBLIMAZE) injection 50 mcg  50 mcg Intravenous Once Carrie Mew, PA-C   Stopped at 07/18/14 1436  . furosemide (LASIX) tablet 40 mg  40 mg Oral Daily Simbiso Ranga, MD   40 mg at 07/23/14 0928  . HYDROcodone-acetaminophen (NORCO/VICODIN) 5-325 MG per tablet 1-2 tablet  1-2 tablet Oral Q4H PRN Toy Baker, MD      . levothyroxine (SYNTHROID, LEVOTHROID) tablet 25 mcg  25 mcg Oral Q48H Simbiso Ranga, MD   25 mcg at 07/23/14 0834   And  . levothyroxine (SYNTHROID, LEVOTHROID) tablet 50 mcg  50 mcg Oral Q48H Simbiso Ranga, MD   50 mcg at 07/24/14 0755  . metroNIDAZOLE (FLAGYL) IVPB 500 mg  500 mg Intravenous Q8H Simbiso Ranga, MD   500 mg at 07/24/14 0418  . ondansetron (ZOFRAN) tablet 4 mg  4 mg Oral Q6H PRN Toy Baker, MD       Or  . ondansetron (ZOFRAN) injection 4 mg  4 mg Intravenous Q6H PRN Toy Baker, MD      . saccharomyces boulardii (FLORASTOR) capsule 250  mg  250 mg Oral BID Michael Boston, MD   250 mg at 07/23/14 2124  . sodium chloride 0.9 % injection 3 mL  3 mL Intravenous Q12H Toy Baker, MD   3 mL at 07/22/14 2307  . sodium chloride 0.9 % injection 3 mL  3 mL Intravenous Q12H Michael Boston, MD   3 mL at 07/23/14 2125  . sodium chloride 0.9 % injection 3 mL  3 mL Intravenous PRN Michael Boston, MD        Allergies as of 07/18/2014 - Review Complete 07/18/2014  Allergen Reaction Noted  .  Shellfish allergy  02/09/2011    Family History  Problem Relation Age of Onset  . Diabetes      fhx  . Hypertension      fhx  . Obesity      fhx    History   Social History  . Marital Status: Married    Spouse Name: N/A    Number of Children: N/A  . Years of Education: N/A   Occupational History  . Not on file.   Social History Main Topics  . Smoking status: Never Smoker   . Smokeless tobacco: Never Used  . Alcohol Use: No  . Drug Use: No  . Sexual Activity: Not on file   Other Topics Concern  . Not on file   Social History Narrative    Review of Systems: Gen: Denies any fever, chills, sweats, anorexia, fatigue, weakness, malaise, weight loss, and sleep disorder CV: Denies chest pain, angina, palpitations, syncope, orthopnea, PND, peripheral edema, and claudication. Resp: Denies dyspnea at rest, dyspnea with exercise, cough, sputum, wheezing, coughing up blood, and pleurisy. GI: Denies vomiting blood, jaundice, and fecal incontinence.   Denies dysphagia or odynophagia. GU : Denies urinary burning, blood in urine, urinary frequency, urinary hesitancy, nocturnal urination, and urinary incontinence. MS: Denies joint pain, limitation of movement, and swelling, stiffness, low back pain, extremity pain. Denies muscle weakness, cramps, atrophy.  Derm: Denies rash, itching, dry skin, hives, moles, warts, or unhealing ulcers.  Psych: Denies depression, anxiety, memory loss, suicidal ideation, hallucinations, paranoia, and  confusion. Heme: Denies bruising, bleeding, and enlarged lymph nodes. Neuro:  Denies any headaches, dizziness, paresthesias. Endo:  Denies any problems with DM, thyroid, adrenal function.  Physical Exam: Vital signs in last 24 hours: Temp:  [97.8 F (36.6 C)-98.4 F (36.9 C)] 98.2 F (36.8 C) (01/19 0533) Pulse Rate:  [63-68] 63 (01/19 0533) Resp:  [16-18] 18 (01/19 0533) BP: (108-139)/(51-61) 128/59 mmHg (01/19 0533) SpO2:  [96 %-99 %] 98 % (01/19 0533) Last BM Date: 07/23/14 General:   Alert,  Well-developed, well-nourished, pleasant and cooperative in NAD Head:  Normocephalic and atraumatic. Eyes:  Sclera clear, no icterus.  Conjunctiva pink. Ears:  Normal auditory acuity. Nose:  No deformity, discharge,  or lesions. Mouth:  No deformity or lesions.   Neck:  Supple; no masses or thyromegaly. Lungs:  Clear throughout to auscultation.   No wheezes, crackles, or rhonchi.  Heart:  Regular rate and rhythm; no murmurs, clicks, rubs,  or gallops. Abdomen:  Soft,nontender, BS active,nonpalp mass or hsm.   Rectal:  Deferred  Msk:  Symmetrical without gross deformities. . Pulses:  Normal pulses noted. Extremities:  Without clubbing or edema. Neurologic:  Alert and  oriented x4;  grossly normal neurologically. Skin:  Intact without significant lesions or rashes.. Psych:  Alert and cooperative. Normal mood and affect.  Intake/Output from previous day: 01/18 0701 - 01/19 0700 In: 800 [P.O.:600; IV Piggyback:200] Out: 800 [Urine:800] Intake/Output this shift:    Lab Results:  Recent Labs  07/22/14 0554 07/23/14 0455 07/24/14 0452  WBC 11.4* 11.5* 10.9*  HGB 10.9* 10.6* 11.0*  HCT 34.0* 32.8* 33.9*  PLT 191 178 194   BMET  Recent Labs  07/22/14 0554 07/23/14 0455 07/24/14 0452  NA 140 141 138  K 3.6 3.3* 3.6  CL 107 108 107  CO2 22 25 22   GLUCOSE 109* 98 88  BUN 24* 19 15  CREATININE 1.26* 1.30* 1.28*  CALCIUM 8.9 8.4 8.7   CMET 07/20/14  With albumin  3.3,  total protein 5.7   LFT On 07/20/2014, AST 16, ALT 10, alkaline phosphatase 57, total bili 0.4.  Cdiff on 21/13/16 negative. Stool culture 07/18/14 no salmonaella, shigella, campylobacter, yersinia, or e. Coli isolated. Fecal lactoferrin 07/18/14 positive. Studies/Results: Dg Abd 2 Views  2014-08-19   CLINICAL DATA:  Subsequent encounter for vomiting. Small bowel obstruction.  EXAM: ABDOMEN - 2 VIEW  COMPARISON:  One day prior  FINDINGS: Upright and supine images. Supine images demonstrate persistent small bowel dilatation. Maximally 6.0 cm today versus 4.4 cm at the same level on the prior. Relative paucity of colonic gas.  Upright images demonstrate air-fluid levels throughout left-sided small bowel loops. No free intraperitoneal air.  IMPRESSION: Slight increase in partial small bowel obstruction.   Electronically Signed   By: Abigail Miyamoto M.D.   On: 08-19-14 13:01  EXAM: ABDOMEN - 2 VIEW  COMPARISON: 08/19/14  FINDINGS: Bowel gas pattern demonstrates continued dilated air-filled small bowel loops in the left abdomen which had decreased in diameter measuring approximately 4.7 cm (previously 6 cm). Few scattered nonspecific air-fluid levels over the right abdomen. Less number of dilated air-filled small bowel loops. No free peritoneal air. Moderate air within the right colon. Remainder of the exam is unchanged.  IMPRESSION: Interval improvement in patient's partial small bowel obstructive process.   Electronically Signed  By: Marin Olp M.D.  On: 07/24/2014 09:41   IMPRESSION/PLAN:  81 yo female admitted with N/V and SBO, developed diarrhea after CT contrast. N/V have improved, but continues to have diarrhea.  Would d/c antibiotics at this time . Pt will likely not need colonoscopy at his time, but ? SBFT. Will review with Dr Henrene Pastor.    Hvozdovic, Deloris Ping 07/24/2014,  Pager [phone]  GI ATTENDING  History, laboratories, x-rays reviewed. Patient personally  seen and examined. Family members in room. Agree with H&P as outlined above. The patient presents with what appears to be a mechanical bowel obstruction, likely secondary to adhesions, in the midportion of the small bowel. She has had incomplete, but definite, clinical and radiographic improvement. Of interest, problems with diarrhea since shortly after admission. Certainly would be unusual as a component of small bowel obstruction. Question of enteritis to explain the entire picture raised. Not clear that the case. Certainly would stop antibiotics as these may continue to diarrhea and there is no evidence for or suspicion of Cipro/Flagyl responsive microbe. Would continue to follow clinically and radiographically. If symptoms and x-ray abnormalities persist, would proceed with small bowel follow-through to assess more specifically for mechanical obstruction. Discussed with patient and family. Will follow. Thank you  Docia Chuck. Geri Seminole., M.D. [facility] Division of Gastroenterology

## 2014-07-24 NOTE — Progress Notes (Signed)
[facility] PROGRESS NOTE  [name] [id number] DOB: [birth year] DOA: 07/18/2014 PCP: Joycelyn Man, MD  Summary/subjective: Appreciate Gen surgery/Cardiology/GI. [name] is a very pleasant 81 y.o. female with history of Allergies; essential hypertension; Arthritis; ovarian Cancer; Obesity; Hyperlipidemia; Broken foot and CKD stage III baseline creatinine 1.9 in November 2015(followed by Dr. Jimmy Footman) whol came in with 3 day history of intractable vomiting and was found to have suggestion of small bowel obstruction with CT abdomen and pelvis showing "1. Small bowel dilatation with air-fluid levels in the stomach, small bowel and colon. Decompressed loops of distal small bowel are identified with a relative transition point in the anterior right mid abdomen. The appearance is concerning for a small bowel obstruction. 2. The air-fluid levels within the colon may be related to a colonic ileus versus diarrhea secondary to an infectious etiology". She also had acute kidney injury on CKD stage 3 with a BUN/CR-91/2.92, which may have been exacerbated by diuretics in setting of dehydration. She had leukocytosis of 12,000 as well as lactic acidosis raising concern for ischemia/infection. UA/chest x-ray were unremarkable. She has had multiple abdominal procedures in the past raising possibility of adhesions. She had made good progress GI wise but she vomited after a regular meal on 07/21/14. She has had diarrhea as well. Patient's hospital stay was complicated by an episode of tachycardia the night of 07/19/13 concerning for atrial fibrillation with rapid ventricular response. She was given Lopressor IV but then developed episodes of sinus pauses, the longest one lasting 8.94 seconds. Cardiology has signed off with recommendation to continue observation. 2-D echocardiogram shows an normal ejection fraction and mild to moderate mitral regurgitation. A trial of Flagyl/ciprofloxacin was  initiated 07/22/14 as there was question of enteritis but this did not help if not made things worse in terms of diarrhea. Antibiotics were therefore discontinued today. Patient seems to be tolerating soft diet. Will defer to general surgery/GI regarding further management.  Plan SBO (small bowel obstruction)/ Diarrhea  Advance diet as tolerated by patient  Discontinue Flagyl/Cipro.  Follow Surgery/GI recommendations. Acute on chronic renal failure/CKD (chronic kidney disease) stage 3, GFR 30-59 ml/min/Hyponatremia/Dehydration  Bun/Cr 24/1.28  Renal function now better than baseline.  Continue Lasix. Replenish potassium Essential hypertension/sinus pause  Continue Amlodipine  Follow Cardiology recommendations Code Status: Full Code. Family Communication: Spoke with son and daughter at bedside. Disposition Plan: Eventually home   Consultants:  General surgery  Cardiology  GI  Procedures:  None  Antibiotics: None  HPI/Subjective: Feels so-so. Still has diarrhea.  Objective: Filed Vitals:   07/24/14 1443  BP: 140/55  Pulse: 73  Temp: 98.2 F (36.8 C)  Resp: 18    Intake/Output Summary (Last 24 hours) at 07/24/14 1837 Last data filed at 07/24/14 1443  Gross per 24 hour  Intake    440 ml  Output    600 ml  Net   -160 ml   Filed Weights   07/18/14 2011  Weight: 86.8 kg (191 lb 5.8 oz)    Exam:   General:  Sitting up eating.  Cardiovascular: RRR. No murmurs. S1S2 normal.  Respiratory: Lungs clear  Abdomen: soft. Non tender. +BS.  Musculoskeletal: No pedal edema.   Data Reviewed: Basic Metabolic Panel:  Recent Labs Lab 07/19/14 0330 07/20/14 0218 07/22/14 0554 07/23/14 0455 07/24/14 0452  NA 134* 135 140 141 138  K 3.6 4.0 3.6 3.3* 3.6  CL 100 112 107 108 107  CO2 23 21 22 25 22   GLUCOSE 103*  100* 109* 98 88  BUN 85* 52* 24* 19 15  CREATININE 2.15* 1.55* 1.26* 1.30* 1.28*  CALCIUM 8.2* 8.2* 8.9 8.4 8.7  MG 2.5  --   --   --  1.5   PHOS 4.1  --   --   --   --    Liver Function Tests:  Recent Labs Lab 07/18/14 1417 07/19/14 0330 07/20/14 0218  AST 23 13 16   ALT 13 9 10   ALKPHOS 76 56 57  BILITOT 0.8 0.6 0.4  PROT 8.3 6.1 5.7*  ALBUMIN 4.9 3.6 3.3*    Recent Labs Lab 07/18/14 1415 07/22/14 1225  LIPASE 20 32   No results for input(s): AMMONIA in the last 168 hours. CBC:  Recent Labs Lab 07/18/14 1417 07/19/14 0330 07/20/14 0218 07/22/14 0554 07/23/14 0455 07/24/14 0452  WBC 12.1* 10.0 9.3 11.4* 11.5* 10.9*  NEUTROABS 9.5*  --   --   --   --   --   HGB 15.1* 11.6* 11.8* 10.9* 10.6* 11.0*  HCT 44.7 36.4 36.4 34.0* 32.8* 33.9*  MCV 90.3 91.7 91.9 92.6 91.6 91.4  PLT 278 203 203 191 178 194   Cardiac Enzymes:  Recent Labs Lab 07/18/14 1415 07/20/14 0218  TROPONINI <0.03 <0.03   BNP (last 3 results) No results for input(s): PROBNP in the last 8760 hours. CBG:  Recent Labs Lab 07/22/14 1144 07/22/14 1656 07/23/14 0743 07/23/14 1152 07/23/14 1705  GLUCAP 102* 103* 87 104* 97    Recent Results (from the past 240 hour(s))  Clostridium Difficile by PCR     Status: None   Collection Time: 07/18/14  5:54 PM  Result Value Ref Range Status   C difficile by pcr NEGATIVE NEGATIVE Final    Comment: Performed at [facility]  Stool culture     Status: None   Collection Time: 07/18/14  9:04 PM  Result Value Ref Range Status   Specimen Description PERIRECTAL  Final   Special Requests Normal  Final   Culture   Final    NO SALMONELLA, SHIGELLA, CAMPYLOBACTER, YERSINIA, OR E.COLI 0157:H7 ISOLATED Performed at [facility]    Report Status 07/22/2014 FINAL  Final  Clostridium Difficile by PCR     Status: None   Collection Time: 07/24/14  4:18 AM  Result Value Ref Range Status   C difficile by pcr NEGATIVE NEGATIVE Final    Comment: Performed at [facility]     Studies: Dg Abd 2 Views  07/24/2014   CLINICAL DATA:  Followup small bowel obstruction. Diarrhea  all last night. No vomiting since Saturday 07/21/2014.  EXAM: ABDOMEN - 2 VIEW  COMPARISON:  07/22/2014  FINDINGS: Bowel gas pattern demonstrates continued dilated air-filled small bowel loops in the left abdomen which had decreased in diameter measuring approximately 4.7 cm (previously 6 cm). Few scattered nonspecific air-fluid levels over the right abdomen. Less number of dilated air-filled small bowel loops. No free peritoneal air. Moderate air within the right colon. Remainder of the exam is unchanged.  IMPRESSION: Interval improvement in patient's partial small bowel obstructive process.   Electronically Signed   By: Marin Olp M.D.   On: 07/24/2014 09:41    Scheduled Meds: . amLODipine  5 mg Oral Daily  . aspirin EC  81 mg Oral Daily  . fentaNYL  50 mcg Intravenous Once  . furosemide  40 mg Oral Daily  . levothyroxine  25 mcg Oral Q48H   And  . levothyroxine  50  mcg Oral Q48H  . sodium chloride  3 mL Intravenous Q12H  . sodium chloride  3 mL Intravenous Q12H   Continuous Infusions:   Principal Problem:   SBO (small bowel obstruction) Active Problems:   Acute on chronic renal failure   Essential hypertension   CKD (chronic kidney disease) stage 3, GFR 30-59 ml/min   Sinus pause   Atrial fibrillation with controlled ventricular response   Mitral regurgitation    Time spent: 20 minutes    Jansen Goodpasture  [facility] Pager [phone]. If 7PM-7AM, please contact night-coverage at [email or web address], password [facility] 07/24/2014, 6:37 PM  LOS: 6 days

## 2014-07-24 NOTE — Progress Notes (Signed)
Subjective: 7 loose stools yesterday, last one about 5 Am this morning.  Tolerating full liquids and to go up to soft diet at lunch.  Abdomen is soft and she's up in chair, but does not look distended.  Objective: Vital signs in last 24 hours: Temp:  [97.8 F (36.6 C)-98.4 F (36.9 C)] 98.2 F (36.8 C) August 10, 2022 0533) Pulse Rate:  [63-68] 63 August 10, 2022 0533) Resp:  [16-18] 18 10-Aug-2022 0533) BP: (123-139)/(57-61) 128/59 mmHg 08/10/2022 0533) SpO2:  [96 %-99 %] 98 % 08-10-2022 0533) Last BM Date: 08/10/14 600 PO  Soft diet started this AM 7 stool recorded yesterday Afebrile, VSS Stable renal insuffiencey Film today shows a decrease of her SB diameter Intake/Output from previous day: 01/18 0701 - 08-10-22 0700 In: 800 [P.O.:600; IV Piggyback:200] Out: 800 [Urine:800] Intake/Output this shift:    General appearance: alert, cooperative and no distress GI: soft, non-tender; bowel sounds normal; no masses,  no organomegaly  Lab Results:   Recent Labs  07/23/14 0455 10-Aug-2014 0452  WBC 11.5* 10.9*  HGB 10.6* 11.0*  HCT 32.8* 33.9*  PLT 178 194    BMET  Recent Labs  07/23/14 0455 10-Aug-2014 0452  NA 141 138  K 3.3* 3.6  CL 108 107  CO2 25 22  GLUCOSE 98 88  BUN 19 15  CREATININE 1.30* 1.28*  CALCIUM 8.4 8.7   PT/INR No results for input(s): LABPROT, INR in the last 72 hours.   Recent Labs Lab 07/18/14 1417 07/19/14 0330 07/20/14 0218  AST 23 13 16   ALT 13 9 10   ALKPHOS 76 56 57  BILITOT 0.8 0.6 0.4  PROT 8.3 6.1 5.7*  ALBUMIN 4.9 3.6 3.3*     Lipase     Component Value Date/Time   LIPASE 32 07/22/2014 1225     Studies/Results: Dg Abd 2 Views  2014-08-10   CLINICAL DATA:  Followup small bowel obstruction. Diarrhea all last night. No vomiting since Saturday 07/21/2014.  EXAM: ABDOMEN - 2 VIEW  COMPARISON:  07/22/2014  FINDINGS: Bowel gas pattern demonstrates continued dilated air-filled small bowel loops in the left abdomen which had decreased in diameter measuring  approximately 4.7 cm (previously 6 cm). Few scattered nonspecific air-fluid levels over the right abdomen. Less number of dilated air-filled small bowel loops. No free peritoneal air. Moderate air within the right colon. Remainder of the exam is unchanged.  IMPRESSION: Interval improvement in patient's partial small bowel obstructive process.   Electronically Signed   By: Marin Olp M.D.   On: 10-Aug-2014 09:41   Dg Abd 2 Views  07/22/2014   CLINICAL DATA:  Subsequent encounter for vomiting. Small bowel obstruction.  EXAM: ABDOMEN - 2 VIEW  COMPARISON:  One day prior  FINDINGS: Upright and supine images. Supine images demonstrate persistent small bowel dilatation. Maximally 6.0 cm today versus 4.4 cm at the same level on the prior. Relative paucity of colonic gas.  Upright images demonstrate air-fluid levels throughout left-sided small bowel loops. No free intraperitoneal air.  IMPRESSION: Slight increase in partial small bowel obstruction.   Electronically Signed   By: Abigail Miyamoto M.D.   On: 07/22/2014 13:01    Medications: . allopurinol  100 mg Oral Daily  . amLODipine  5 mg Oral Daily  . aspirin EC  81 mg Oral Daily  . ciprofloxacin  400 mg Intravenous Q24H  . fentaNYL  50 mcg Intravenous Once  . furosemide  40 mg Oral Daily  . levothyroxine  25 mcg Oral Q48H  And  . levothyroxine  50 mcg Oral Q48H  . metronidazole  500 mg Intravenous Q8H  . saccharomyces boulardii  250 mg Oral BID  . sodium chloride  3 mL Intravenous Q12H  . sodium chloride  3 mL Intravenous Q12H    Assessment/Plan SBO with history of hysterectomy for ovarian cancer, Prior cholecystectomy Recurrent nausea and vomiting with episodes of diarrhea/Negative C diff/stool cultures are also negative. Possible PSBO, enteritis, possible radiation related enteritis Hypertension Acute on chronic renal failure Arthritis S/p right knee replacement  Obesity  Episode of AF with RVR     Plan: Dr. Henrene Pastor is coming to see  her now.  She is advancing her diet.  We will continue to follow.    LOS: 6 days    Rhyse Loux 07/24/2014

## 2014-07-25 DIAGNOSIS — R935 Abnormal findings on diagnostic imaging of other abdominal regions, including retroperitoneum: Secondary | ICD-10-CM | POA: Insufficient documentation

## 2014-07-25 LAB — COMPREHENSIVE METABOLIC PANEL
ALBUMIN: 3.5 g/dL (ref 3.5–5.2)
ALK PHOS: 63 U/L (ref 39–117)
ALT: 12 U/L (ref 0–35)
AST: 18 U/L (ref 0–37)
Anion gap: 7 (ref 5–15)
BILIRUBIN TOTAL: 0.5 mg/dL (ref 0.3–1.2)
BUN: 13 mg/dL (ref 6–23)
CHLORIDE: 106 meq/L (ref 96–112)
CO2: 25 mmol/L (ref 19–32)
CREATININE: 1.27 mg/dL — AB (ref 0.50–1.10)
Calcium: 8.6 mg/dL (ref 8.4–10.5)
GFR calc Af Amer: 46 mL/min — ABNORMAL LOW (ref >90)
GFR calc non Af Amer: 39 mL/min — ABNORMAL LOW (ref >90)
Glucose, Bld: 100 mg/dL — ABNORMAL HIGH (ref 70–99)
Potassium: 3.2 mmol/L — ABNORMAL LOW (ref 3.5–5.1)
SODIUM: 138 mmol/L (ref 135–145)
Total Protein: 5.9 g/dL — ABNORMAL LOW (ref 6.0–8.3)

## 2014-07-25 LAB — CBC
HCT: 32.7 % — ABNORMAL LOW (ref 36.0–46.0)
Hemoglobin: 10.7 g/dL — ABNORMAL LOW (ref 12.0–15.0)
MCH: 30 pg (ref 26.0–34.0)
MCHC: 32.7 g/dL (ref 30.0–36.0)
MCV: 91.6 fL (ref 78.0–100.0)
Platelets: 206 10*3/uL (ref 150–400)
RBC: 3.57 MIL/uL — AB (ref 3.87–5.11)
RDW: 14.6 % (ref 11.5–15.5)
WBC: 9.3 10*3/uL (ref 4.0–10.5)

## 2014-07-25 LAB — GLIADIN ANTIBODIES, SERUM
GLIADIN IGA: 3 U (ref <20)
Gliadin IgG: 2 Units (ref <20)

## 2014-07-25 LAB — TISSUE TRANSGLUTAMINASE, IGA: Tissue Transglutaminase Ab, IgA: 1 U/mL (ref <4)

## 2014-07-25 MED ORDER — POTASSIUM CHLORIDE CRYS ER 20 MEQ PO TBCR
40.0000 meq | EXTENDED_RELEASE_TABLET | Freq: Once | ORAL | Status: AC
  Administered 2014-07-25: 40 meq via ORAL
  Filled 2014-07-25: qty 2

## 2014-07-25 NOTE — Progress Notes (Signed)
  Subjective: She seems to be doing better.  She had several episodes of diarrhea yesterday, but none since 1 AM this morning.  Tolerating soft diet.  Objective: Vital signs in last 24 hours: Temp:  [98.2 F (36.8 C)-98.7 F (37.1 C)] 98.4 F (36.9 C) (01/20 0525) Pulse Rate:  [65-73] 67 (01/20 0525) Resp:  [18-20] 20 (01/20 0525) BP: (129-140)/(55-63) 132/63 mmHg (01/20 0908) SpO2:  [98 %] 98 % (01/20 0525) Last BM Date: 07/25/14 Bland diet, 240 PO recorded Afebrile, VSS, Creatinine is stable/WBC is normal Intake/Output from previous day: 2022/08/21 0701 - 01/20 0700 In: 240 [P.O.:240] Out: 300 [Urine:300] Intake/Output this shift: Total I/O In: -  Out: 500 [Urine:500]  General appearance: alert, cooperative and no distress GI: soft, non-tender; bowel sounds normal; no masses,  no organomegaly  Lab Results:   Recent Labs  2014-08-21 0452 07/25/14 0431  WBC 10.9* 9.3  HGB 11.0* 10.7*  HCT 33.9* 32.7*  PLT 194 206    BMET  Recent Labs  2014/08/21 0452 07/25/14 0431  NA 138 138  K 3.6 3.2*  CL 107 106  CO2 22 25  GLUCOSE 88 100*  BUN 15 13  CREATININE 1.28* 1.27*  CALCIUM 8.7 8.6   PT/INR No results for input(s): LABPROT, INR in the last 72 hours.   Recent Labs Lab 07/18/14 1417 07/19/14 0330 07/20/14 0218 07/25/14 0431  AST 23 13 16 18   ALT 13 9 10 12   ALKPHOS 76 56 57 63  BILITOT 0.8 0.6 0.4 0.5  PROT 8.3 6.1 5.7* 5.9*  ALBUMIN 4.9 3.6 3.3* 3.5     Lipase     Component Value Date/Time   LIPASE 32 07/22/2014 1225     Studies/Results: Dg Abd 2 Views  Aug 21, 2014   CLINICAL DATA:  Followup small bowel obstruction. Diarrhea all last night. No vomiting since Saturday 07/21/2014.  EXAM: ABDOMEN - 2 VIEW  COMPARISON:  07/22/2014  FINDINGS: Bowel gas pattern demonstrates continued dilated air-filled small bowel loops in the left abdomen which had decreased in diameter measuring approximately 4.7 cm (previously 6 cm). Few scattered nonspecific  air-fluid levels over the right abdomen. Less number of dilated air-filled small bowel loops. No free peritoneal air. Moderate air within the right colon. Remainder of the exam is unchanged.  IMPRESSION: Interval improvement in patient's partial small bowel obstructive process.   Electronically Signed   By: Marin Olp M.D.   On: 2014-08-21 09:41    Medications: . amLODipine  5 mg Oral Daily  . aspirin EC  81 mg Oral Daily  . fentaNYL  50 mcg Intravenous Once  . furosemide  40 mg Oral Daily  . levothyroxine  25 mcg Oral Q48H   And  . levothyroxine  50 mcg Oral Q48H  . sodium chloride  3 mL Intravenous Q12H  . sodium chloride  3 mL Intravenous Q12H    Assessment/Plan Recurrent nausea and vomiting with episodes of diarrhea/Negative C diff/stool cultures are also negative. Possible PSBO, enteritis, possible radiation related enteritis Hypertension Acute on chronic renal failure Arthritis S/p right knee replacement  Obesity  Episode of AF with RVR    Plan:  Continue to watch her progress.  No surgery needed at this point.    LOS: 7 days    [name] 07/25/2014

## 2014-07-25 NOTE — Progress Notes (Signed)
     [facility] Progress Note  Subjective:   Feels "much better" today. Last BM was 1 am. No n/v.  Had broyth and toast last pm.   Objective:  Vital signs in last 24 hours: Temp:  [98.2 F (36.8 C)-98.7 F (37.1 C)] 98.4 F (36.9 C) (01/20 0525) Pulse Rate:  [65-73] 67 (01/20 0525) Resp:  [18-20] 20 (01/20 0525) BP: (129-140)/(55-60) 129/57 mmHg (01/20 0525) SpO2:  [98 %] 98 % (01/20 0525) Last BM Date: 07/24/14 General:   Alert,  Well-developed,  in NAD Heart:  Regular rate and rhythm; no murmurs Pulm;lungs clear Abdomen:  Soft, nontender and nondistended. Normal bowel sounds, without guarding, and without rebound.   Extremities:  Without edema. Neurologic:  Alert and  oriented x4;  grossly normal neurologically. Psych:  Alert and cooperative. Normal mood and affect.  Intake/Output from previous day: 01/19 0701 - 01/20 0700 In: 240 [P.O.:240] Out: 300 [Urine:300] Intake/Output this shift:    Lab Results:  Recent Labs  07/23/14 0455 07/24/14 0452 07/25/14 0431  WBC 11.5* 10.9* 9.3  HGB 10.6* 11.0* 10.7*  HCT 32.8* 33.9* 32.7*  PLT 178 194 206   BMET  Recent Labs  07/23/14 0455 07/24/14 0452 07/25/14 0431  NA 141 138 138  K 3.3* 3.6 3.2*  CL 108 107 106  CO2 25 22 25   GLUCOSE 98 88 100*  BUN 19 15 13   CREATININE 1.30* 1.28* 1.27*  CALCIUM 8.4 8.7 8.6   LFT  Recent Labs  07/25/14 0431  PROT 5.9*  ALBUMIN 3.5  AST 18  ALT 12  ALKPHOS 63  BILITOT 0.5      Dg Abd 2 Views  07/24/2014   CLINICAL DATA:  Followup small bowel obstruction. Diarrhea all last night. No vomiting since Saturday 07/21/2014.  EXAM: ABDOMEN - 2 VIEW  COMPARISON:  07/22/2014  FINDINGS: Bowel gas pattern demonstrates continued dilated air-filled small bowel loops in the left abdomen which had decreased in diameter measuring approximately 4.7 cm (previously 6 cm). Few scattered nonspecific air-fluid levels over the right abdomen. Less number of dilated air-filled  small bowel loops. No free peritoneal air. Moderate air within the right colon. Remainder of the exam is unchanged.  IMPRESSION: Interval improvement in patient's partial small bowel obstructive process.   Electronically Signed   By: Marin Olp M.D.   On: 07/24/2014 09:41    ASSESSMENT/PLAN:  81 yo female with SBO in midportion of small bowel, likely due to adhesions. Has had some improvement. Antibiotics have been d/c'd. Will advance diet as tol. If diarrhea again increases or if she has pain/distention, will check SBFT.     LOS: 7 days   Hvozdovic, Vita Barley PA-C 07/25/2014, Pager [phone]  GI ATTENDING  Interval history and data reviewed. Patient personally seen and examined. Son in room. Patient reports doing remarkably well. No bowel movements in 15 hours. Tolerating diet. Ambulating. Abdomen benign. Continue to advance diet towards baseline. Follow-up plain films of the abdomen tomorrow. Hopefully home soon.  Docia Chuck. Geri Seminole., M.D. [facility] Division of Gastroenterology

## 2014-07-25 NOTE — Progress Notes (Signed)
[facility] PROGRESS NOTE  [name] [id number] DOB: [birth year] DOA: 07/18/2014 PCP: Joycelyn Man, MD  Summary/subjective: [name] is a very pleasant 81 y.o. female with history of Allergies; essential hypertension; Arthritis; ovarian Cancer; Obesity; Hyperlipidemia; Broken foot and CKD stage III baseline creatinine 1.9 in November 2015(followed by Dr. Jimmy Footman) whol came in with 3 day history of intractable vomiting and was found to have suggestion of small bowel obstruction with CT abdomen and pelvis showing "1. Small bowel dilatation with air-fluid levels in the stomach, small bowel and colon. Decompressed loops of distal small bowel are identified with a relative transition point in the anterior right mid abdomen. The appearance is concerning for a small bowel obstruction. 2. The air-fluid levels within the colon may be related to a colonic ileus versus diarrhea secondary to an infectious etiology". She also had acute kidney injury on CKD stage 3 with a BUN/CR-91/2.92, which may have been exacerbated by diuretics in setting of dehydration. She had leukocytosis of 12,000 as well as lactic acidosis raising concern for ischemia/infection. UA/chest x-ray were unremarkable. She has had multiple abdominal procedures in the past raising possibility of adhesions. She had made good progress GI wise but she vomited after a regular meal on 07/21/14. She has had diarrhea as well. Patient's hospital stay was complicated by an episode of tachycardia the night of 07/19/13 concerning for atrial fibrillation with rapid ventricular response. She was given Lopressor IV but then developed episodes of sinus pauses, the longest one lasting 8.94 seconds. Cardiology has signed off with recommendation to continue observation. 2-D echocardiogram shows an normal ejection fraction and mild to moderate mitral regurgitation. A trial of Flagyl/ciprofloxacin was initiated 07/22/14 as there was question of  enteritis but this did not help if not made things worse in terms of diarrhea. Antibiotics were therefore discontinued today. Patient seems to be tolerating soft diet. Will defer to general surgery/GI regarding further management.   Plan SBO (small bowel obstruction)/ Diarrhea  Advance diet as tolerated  Cipro and flagyl discontinue 1-19   Follow Surgery/GI recommendations.  GI pathogen pending.   If diarrhea persist plan for SBFT.   Acute on chronic renal failure/CKD (chronic kidney disease) stage 3, GFR 30-59 ml/min/Hyponatremia/Dehydration  Bun/Cr 24/1.28  Renal function now better than baseline.  Continue Lasix. Replenish potassium  Essential hypertension/sinus pause  Continue Amlodipine  Follow Cardiology recommendations  Replete potassium.    Code Status: Full Code. Family Communication: Spoke with son at bedside. Disposition Plan: Eventually home   Consultants:  General surgery  Cardiology  GI  Procedures:  None  Antibiotics: None  HPI/Subjective: Feels better. No nausea or vomiting. Tolerating current diet. Had 4 to 5 BM yesterday. One BM today so far around 1 am.  She has been walking in the hall.    Objective: Filed Vitals:   07/25/14 0908  BP: 132/63  Pulse:   Temp:   Resp:     Intake/Output Summary (Last 24 hours) at 07/25/14 1240 Last data filed at 07/25/14 1038  Gross per 24 hour  Intake    240 ml  Output    800 ml  Net   -560 ml   Filed Weights   07/18/14 2011  Weight: 86.8 kg (191 lb 5.8 oz)    Exam:   General:  Sitting up eating.  Cardiovascular: RRR. No murmurs. S1S2 normal.  Respiratory: Lungs clear  Abdomen: soft. Non tender. +BS.  Musculoskeletal: No pedal edema.   Data Reviewed: Basic Metabolic Panel:  Recent Labs Lab 07/19/14 0330 07/20/14 0218 07/22/14 0554 07/23/14 0455 07/24/14 0452 07/25/14 0431  NA 134* 135 140 141 138 138  K 3.6 4.0 3.6 3.3* 3.6 3.2*  CL 100 112 107 108 107 106  CO2  23 21 22 25 22 25   GLUCOSE 103* 100* 109* 98 88 100*  BUN 85* 52* 24* 19 15 13   CREATININE 2.15* 1.55* 1.26* 1.30* 1.28* 1.27*  CALCIUM 8.2* 8.2* 8.9 8.4 8.7 8.6  MG 2.5  --   --   --  1.5  --   PHOS 4.1  --   --   --   --   --    Liver Function Tests:  Recent Labs Lab 07/18/14 1417 07/19/14 0330 07/20/14 0218 07/25/14 0431  AST 23 13 16 18   ALT 13 9 10 12   ALKPHOS 76 56 57 63  BILITOT 0.8 0.6 0.4 0.5  PROT 8.3 6.1 5.7* 5.9*  ALBUMIN 4.9 3.6 3.3* 3.5    Recent Labs Lab 07/18/14 1415 07/22/14 1225  LIPASE 20 32   No results for input(s): AMMONIA in the last 168 hours. CBC:  Recent Labs Lab 07/18/14 1417  07/20/14 0218 07/22/14 0554 07/23/14 0455 07/24/14 0452 07/25/14 0431  WBC 12.1*  < > 9.3 11.4* 11.5* 10.9* 9.3  NEUTROABS 9.5*  --   --   --   --   --   --   HGB 15.1*  < > 11.8* 10.9* 10.6* 11.0* 10.7*  HCT 44.7  < > 36.4 34.0* 32.8* 33.9* 32.7*  MCV 90.3  < > 91.9 92.6 91.6 91.4 91.6  PLT 278  < > 203 191 178 194 206  < > = values in this interval not displayed. Cardiac Enzymes:  Recent Labs Lab 07/18/14 1415 07/20/14 0218  TROPONINI <0.03 <0.03   BNP (last 3 results) No results for input(s): PROBNP in the last 8760 hours. CBG:  Recent Labs Lab 07/22/14 1144 07/22/14 1656 07/23/14 0743 07/23/14 1152 07/23/14 1705  GLUCAP 102* 103* 87 104* 97    Recent Results (from the past 240 hour(s))  Clostridium Difficile by PCR     Status: None   Collection Time: 07/18/14  5:54 PM  Result Value Ref Range Status   C difficile by pcr NEGATIVE NEGATIVE Final    Comment: Performed at [facility]  Stool culture     Status: None   Collection Time: 07/18/14  9:04 PM  Result Value Ref Range Status   Specimen Description PERIRECTAL  Final   Special Requests Normal  Final   Culture   Final    NO SALMONELLA, SHIGELLA, CAMPYLOBACTER, YERSINIA, OR E.COLI 0157:H7 ISOLATED Performed at [facility]    Report Status 07/22/2014 FINAL  Final   Clostridium Difficile by PCR     Status: None   Collection Time: 07/24/14  4:18 AM  Result Value Ref Range Status   C difficile by pcr NEGATIVE NEGATIVE Final    Comment: Performed at [facility]     Studies: Dg Abd 2 Views  07/24/2014   CLINICAL DATA:  Followup small bowel obstruction. Diarrhea all last night. No vomiting since Saturday 07/21/2014.  EXAM: ABDOMEN - 2 VIEW  COMPARISON:  07/22/2014  FINDINGS: Bowel gas pattern demonstrates continued dilated air-filled small bowel loops in the left abdomen which had decreased in diameter measuring approximately 4.7 cm (previously 6 cm). Few scattered nonspecific air-fluid levels over the right abdomen. Less number of dilated air-filled small bowel loops. No  free peritoneal air. Moderate air within the right colon. Remainder of the exam is unchanged.  IMPRESSION: Interval improvement in patient's partial small bowel obstructive process.   Electronically Signed   By: Marin Olp M.D.   On: 07/24/2014 09:41    Scheduled Meds: . amLODipine  5 mg Oral Daily  . aspirin EC  81 mg Oral Daily  . fentaNYL  50 mcg Intravenous Once  . furosemide  40 mg Oral Daily  . levothyroxine  25 mcg Oral Q48H   And  . levothyroxine  50 mcg Oral Q48H  . sodium chloride  3 mL Intravenous Q12H  . sodium chloride  3 mL Intravenous Q12H   Continuous Infusions:   Principal Problem:   SBO (small bowel obstruction) Active Problems:   Essential hypertension   CKD (chronic kidney disease) stage 3, GFR 30-59 ml/min   Acute on chronic renal failure   Sinus pause   Atrial fibrillation with controlled ventricular response   Mitral regurgitation    Time spent: 20 minutes    Regalado, Belkys A  [facility] Pager [phone] If 7PM-7AM, please contact night-coverage at [email or web address], password [facility] 07/25/2014, 12:40 PM  LOS: 7 days

## 2014-07-26 ENCOUNTER — Inpatient Hospital Stay (HOSPITAL_COMMUNITY): Payer: Medicare Other

## 2014-07-26 DIAGNOSIS — R109 Unspecified abdominal pain: Secondary | ICD-10-CM | POA: Insufficient documentation

## 2014-07-26 DIAGNOSIS — R1030 Lower abdominal pain, unspecified: Secondary | ICD-10-CM

## 2014-07-26 LAB — BASIC METABOLIC PANEL
ANION GAP: 12 (ref 5–15)
BUN: 18 mg/dL (ref 6–23)
CALCIUM: 8.5 mg/dL (ref 8.4–10.5)
CHLORIDE: 99 meq/L (ref 96–112)
CO2: 23 mmol/L (ref 19–32)
Creatinine, Ser: 1.31 mg/dL — ABNORMAL HIGH (ref 0.50–1.10)
GFR calc Af Amer: 44 mL/min — ABNORMAL LOW (ref >90)
GFR, EST NON AFRICAN AMERICAN: 38 mL/min — AB (ref >90)
Glucose, Bld: 100 mg/dL — ABNORMAL HIGH (ref 70–99)
POTASSIUM: 3 mmol/L — AB (ref 3.5–5.1)
SODIUM: 134 mmol/L — AB (ref 135–145)

## 2014-07-26 LAB — RETICULIN ANTIBODIES, IGA W TITER: RETICULIN AB, IGA: NEGATIVE

## 2014-07-26 MED ORDER — AMLODIPINE BESYLATE 5 MG PO TABS: 5.0000 mg | ORAL_TABLET | Freq: Every day | ORAL | Status: DC

## 2014-07-26 MED ORDER — POTASSIUM CHLORIDE CRYS ER 20 MEQ PO TBCR
40.0000 meq | EXTENDED_RELEASE_TABLET | Freq: Once | ORAL | Status: AC
  Administered 2014-07-26: 40 meq via ORAL
  Filled 2014-07-26: qty 2

## 2014-07-26 MED ORDER — FUROSEMIDE 40 MG PO TABS
ORAL_TABLET | ORAL | Status: DC
Start: 2014-07-26 — End: 2015-06-24

## 2014-07-26 NOTE — Discharge Instructions (Signed)
Drink plenty of fluids. You need lab work in 3 days to monitor your renal function and electrolytes./

## 2014-07-26 NOTE — Discharge Summary (Signed)
Physician Discharge Summary  [name] EHU:[id number] DOB: [birth year] DOA: 07/18/2014  PCP: Joycelyn Man, MD  Admit date: 07/18/2014 Discharge date: 07/26/2014  Time spent: 35 minutes  Recommendations for Outpatient Follow-up:  1. Needs B-met to follow renal function.  2. Lasix dose was decrease to 20 mg dfaily, consider resume prior dose of 40 mg if diarrhea completely resolved.   Discharge Diagnoses:    SBO (small bowel obstruction)    Diarrhea.    Essential hypertension   CKD (chronic kidney disease) stage 3, GFR 30-59 ml/min   Acute on chronic renal failure   Sinus pause   Atrial fibrillation with controlled ventricular response   Mitral regurgitation   Abnormal x-ray of abdomen   Discharge Condition: stable, improved.   Diet recommendation: soft diet.   Filed Weights   07/18/14 2011  Weight: 86.8 kg (191 lb 5.8 oz)    History of present illness:  Presented with  3 day hx of nausea and vomiting, she felt like she had a unble in her abdomen and having sharp pain. She have not had a BM since Sunday. No fevers or chills Patient have had hysterectomy and cholecystectomy. 25 years ago she had ovarian cancer removed and had radiation therapy to the mesentery. CT scan was done showing Small bowel dilatation with air-fluid levels in the stomach, small bowel and colon. Decompressed loops of distal small bowel are identified with a relative transition point in the anterior right mid abdomen. The appearance is concerning for a small bowel obstruction. When patient drank her CT contrast she developed severe diarrhea up to 10 BM's. The diarrhea very loose ad watery. No blood in stool  Hospitalist was called for admission for SBO, dehydration acute on chronic Renal failure and diarrhea  Hospital Course:  [name] is a very pleasant 81 y.o. female with history of Allergies; essential hypertension; Arthritis; ovarian Cancer; Obesity; Hyperlipidemia; Broken foot and CKD  stage III baseline creatinine 1.9 in November 2015(followed by Dr. Jimmy Footman) whol came in with 3 day history of intractable vomiting and was found to have suggestion of small bowel obstruction with CT abdomen and pelvis showing "1. Small bowel dilatation with air-fluid levels in the stomach, small bowel and colon. Decompressed loops of distal small bowel are identified with a relative transition point in the anterior right mid abdomen. The appearance is concerning for a small bowel obstruction. 2. The air-fluid levels within the colon may be related to a colonic ileus versus diarrhea secondary to an infectious etiology". She also had acute kidney injury on CKD stage 3 with a BUN/CR-91/2.92, which may have been exacerbated by diuretics in setting of dehydration. She had leukocytosis of 12,000 as well as lactic acidosis raising concern for ischemia/infection. UA/chest x-ray were unremarkable. She has had multiple abdominal procedures in the past raising possibility of adhesions. She had made good progress GI wise but she vomited after a regular meal on 07/21/14. She has had diarrhea as well. Patient's hospital stay was complicated by an episode of tachycardia the night of 07/19/13 concerning for atrial fibrillation with rapid ventricular response. She was given Lopressor IV but then developed episodes of sinus pauses, the longest one lasting 8.94 seconds. Cardiology has signed off with recommendation to continue observation. 2-D echocardiogram shows an normal ejection fraction and mild to moderate mitral regurgitation. A trial of Flagyl/ciprofloxacin was initiated 07/22/14 as there was question of enteritis but this did not help if not made things worse in terms of diarrhea. Antibiotics were  therefore discontinued today. Patient seems to be tolerating soft diet. Will defer to general surgery/GI regarding further management.   Plan SBO (small bowel obstruction)/ Diarrhea  Advance diet as tolerated  Cipro and  flagyl discontinue 1-19   Follow Surgery/GI recommendations.  GI pathogen pending.   If diarrhea has improved, one episode in 24 hour period. Tolerating diet. She denies abdominal pain.   discharge patient today, if ok with GI.   Acute on chronic renal failure/CKD (chronic kidney disease) stage 3, GFR 30-59 ml/min/Hyponatremia/Dehydration  Bun/Cr 24/1.28  Renal function stable. Will decrease lasix to half tablet daily. Need bmet in 3 days,   Continue Lasix. Replenish potassium  Essential hypertension/sinus pause  Continue Amlodipine  Follow Cardiology recommendations  Replete potassium.  Procedures:  none  Consultations:  Surgery  GI  Discharge Exam: Filed Vitals:   07/26/14 0520  BP: 126/48  Pulse: 64  Temp: 98.3 F (36.8 C)  Resp: 20    General: Alert in no distress.  Cardiovascular: S 1, S 2 RRR Respiratory: CTA  Discharge Instructions   Discharge Instructions    Diet - low sodium heart healthy    Complete by:  As directed      Increase activity slowly    Complete by:  As directed           Current Discharge Medication List    START taking these medications   Details  amLODipine (NORVASC) 5 MG tablet Take 1 tablet (5 mg total) by mouth daily. Qty: 30 tablet, Refills: 0      CONTINUE these medications which have CHANGED   Details  furosemide (LASIX) 40 MG tablet Take half tablet daily Qty: 100 tablet, Refills: 3   Associated Diagnoses: Essential hypertension, malignant; Essential hypertension      CONTINUE these medications which have NOT CHANGED   Details  acetaminophen (TYLENOL) 650 MG CR tablet Take 650 mg by mouth every 8 (eight) hours as needed for pain (pain).     allopurinol (ZYLOPRIM) 100 MG tablet take 1 tablet by mouth twice a day Qty: 200 tablet, Refills: 3   Associated Diagnoses: Chronic gout due to renal impairment of multiple sites without tophus; Essential hypertension    aspirin 81 MG EC tablet Take 81 mg by mouth  daily.      calcitRIOL (ROCALTROL) 0.25 MCG capsule Take 0.25 mcg by mouth daily.     calcium carbonate (TUMS) 500 MG chewable tablet Chew 1 tablet by mouth daily.      cetirizine (ZYRTEC) 10 MG tablet Take 10 mg by mouth daily.      levothyroxine (SYNTHROID, LEVOTHROID) 50 MCG tablet take 1 tablet by mouth DAILY THEN 1/2 TABLET THE NEXT DAY ROTATING Qty: 150 tablet, Refills: 3    potassium chloride SA (KLOR-CON M20) 20 MEQ tablet take 1 tablet by mouth once daily Qty: 100 tablet, Refills: 3   Associated Diagnoses: Accelerated hypertension; Essential hypertension    PREMARIN 0.3 MG tablet take 1 tablet by mouth once daily for 21 DAYS THEN DO NOT TAKE FOR 7 DAYS Qty: 100 tablet, Refills: 2    simvastatin (ZOCOR) 20 MG tablet take 1 tablet by mouth at bedtime Qty: 100 tablet, Refills: 3      STOP taking these medications     amLODipine-valsartan (EXFORGE) 10-160 MG per tablet        Allergies  Allergen Reactions  . Shellfish Allergy    Follow-up Information    Follow up with TODD,JEFFREY ALLEN, MD In 1 week.  Specialty:  Family Medicine   Contact information:   [place] [address] [phone]        The results of significant diagnostics from this hospitalization (including imaging, microbiology, ancillary and laboratory) are listed below for reference.    Significant Diagnostic Studies: Ct Abdomen Pelvis Wo Contrast  07/18/2014   CLINICAL DATA:  Vomiting with weakness for 3 days. Mid abdominal pain.  EXAM: CT ABDOMEN AND PELVIS WITHOUT CONTRAST  TECHNIQUE: Multidetector CT imaging of the abdomen and pelvis was performed following the standard protocol without IV contrast.  COMPARISON:  None.  FINDINGS: The lung bases are clear.  There is a punctate nonobstructing left renal calculus. There is no ureteral or bladder calculus. No obstructive uropathy. No perinephric stranding is seen. There is a 1.9 x 2 cm hypodense, fluid attenuating left renal  mass most consistent with a cyst. There is a 7 mm hyperdense left renal mass measuring 72 Hounsfield units likely representing a hemorrhagic cyst. There is a 14 x 13 mm hypodense, fluid attenuating right renal mass most consistent with a cyst. There is a similar smaller 14 mm lesion in the right posterior interpolar kidney most consistent with a cyst. The bladder is unremarkable.  The liver demonstrates no focal abnormality. The gallbladder is surgically absent. The spleen demonstrates no focal abnormality. The adrenal glands and pancreas are normal.  There is small bowel dilatation measuring up to 5 cm in diameter. There are air-fluid levels within the small bowel and colon. There is a gastric air-fluid level. There are decompressed loops of distal small bowel with a relative transition point in the anterior right mid abdomen. There is a normal caliber appendix in the right lower quadrant without periappendiceal inflammatory changes. There is no pneumoperitoneum, pneumatosis, or portal venous gas. There is no abdominal or pelvic free fluid. There is no lymphadenopathy.  The abdominal aorta is normal in caliber with atherosclerosis.  There is degenerative disc disease throughout the lumbar spine. There is degenerative facet arthropathy throughout the lumbar spine. 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  IMPRESSION: 1. Small bowel dilatation with air-fluid levels in the stomach, small bowel and colon. Decompressed loops of distal small bowel are identified with a relative transition point in the anterior right mid abdomen. The appearance is concerning for a small bowel obstruction. 2. The air-fluid levels within the colon may be related to a colonic ileus versus diarrhea secondary to an infectious etiology.   Electronically Signed   By: Kathreen Devoid   On: 07/18/2014 18:24   Dg Abd 1 View  07/26/2014   CLINICAL DATA:  Small-bowel obstruction.  EXAM: ABDOMEN - 1 VIEW  COMPARISON:  07/24/2014.  FINDINGS: Soft tissue  structures are unremarkable. Surgical internal quadrant. Dilated loops of small bowel remain. No definite unchanged. No free air. Pelvic phleboliths. Degenerative changes lumbar spine and both hips.  IMPRESSION: Persistent dilated loops of small bowel, no interim change. No free air.   Electronically Signed   By: Marcello Moores  Register   On: 07/26/2014 07:28   Abd 1 View (kub)  07/19/2014   CLINICAL DATA:  Subsequent evaluation for abdominal distension, history of small bowel obstruction, patient had multiple bowel movements with diarrhea yesterday  EXAM: ABDOMEN - 1 VIEW  COMPARISON:  07/18/2014  FINDINGS: There are dilated small bowel similar to prior study. Dilated loops of small bowel are primarily located in the left upper quadrant and left mid abdomen. There is air throughout the colon and into the rectum.  IMPRESSION: The findings of dilated proximal small bowel continue to be concerning for small bowel obstruction, which is likely incomplete as there remains air throughout the entire colon.   Electronically Signed   By: Skipper Cliche M.D.   On: 07/19/2014 09:22   Dg Abd 2 Views  07/24/2014   CLINICAL DATA:  Followup small bowel obstruction. Diarrhea all last night. No vomiting since Saturday 07/21/2014.  EXAM: ABDOMEN - 2 VIEW  COMPARISON:  07/22/2014  FINDINGS: Bowel gas pattern demonstrates continued dilated air-filled small bowel loops in the left abdomen which had decreased in diameter measuring approximately 4.7 cm (previously 6 cm). Few scattered nonspecific air-fluid levels over the right abdomen. Less number of dilated air-filled small bowel loops. No free peritoneal air. Moderate air within the right colon. Remainder of the exam is unchanged.  IMPRESSION: Interval improvement in patient's partial small bowel obstructive process.   Electronically Signed   By: Marin Olp M.D.   On: 07/24/2014 09:41   Dg Abd 2 Views  07/22/2014   CLINICAL DATA:  Subsequent encounter for vomiting. Small bowel  obstruction.  EXAM: ABDOMEN - 2 VIEW  COMPARISON:  One day prior  FINDINGS: Upright and supine images. Supine images demonstrate persistent small bowel dilatation. Maximally 6.0 cm today versus 4.4 cm at the same level on the prior. Relative paucity of colonic gas.  Upright images demonstrate air-fluid levels throughout left-sided small bowel loops. No free intraperitoneal air.  IMPRESSION: Slight increase in partial small bowel obstruction.   Electronically Signed   By: Abigail Miyamoto M.D.   On: 07/22/2014 13:01   Dg Abd Acute W/chest  07/21/2014   CLINICAL DATA:  Small bowel obstruction.  Dyspnea.  EXAM: ACUTE ABDOMEN SERIES (ABDOMEN 2 VIEW & CHEST 1 VIEW)  COMPARISON:  07/19/2014  FINDINGS: Mildly dilated small bowel loops are seen in the left abdomen which contain air-fluid levels. This shows no significant change compared to previous study. Some gas is seen within nondilated colon. Surgical clips again seen from prior cholecystectomy. No evidence of free air.  Heart size is normal. Both lungs are clear. No evidence of pleural effusion.  IMPRESSION: No significant change in mildly dilated small bowel loops in the left abdomen, suspicious for partial small bowel obstruction.  No active cardiopulmonary disease.   Electronically Signed   By: Earle Gell M.D.   On: 07/21/2014 09:26   Dg Abd Acute W/chest  07/18/2014   CLINICAL DATA:  Upper abdominal pain for 3 days with vomiting. History of hypertension. Initial encounter.  EXAM: ACUTE ABDOMEN SERIES (ABDOMEN 2 VIEW & CHEST 1 VIEW)  COMPARISON:  Chest radiographs 07/14/2007.  FINDINGS: The heart size and mediastinal contours are stable allowing for slight patient rotation to the right. There is aortic atherosclerosis and mild left basilar atelectasis or scarring. The lungs are otherwise clear. There is no pleural effusion.  There is moderate diffuse small bowel distension with air- fluid levels at different levels on the decubitus view. The colon is relatively  decompressed. There is no evidence of free intraperitoneal air. Cholecystectomy clips and mild lumbar spine degenerative changes are noted.  IMPRESSION: 1. Small bowel obstruction pattern without evidence of perforation. CT may be helpful for further evaluation. 2. No acute cardiopulmonary process.   Electronically Signed   By: Camie Patience M.D.   On: 07/18/2014 15:10    Microbiology: Recent Results (from the past 240 hour(s))  Clostridium Difficile by PCR     Status: None   Collection Time: 07/18/14  5:54 PM  Result Value Ref Range Status   C difficile by pcr NEGATIVE NEGATIVE Final    Comment: Performed at [facility]  Stool culture     Status: None   Collection Time: 07/18/14  9:04 PM  Result Value Ref Range Status   Specimen Description PERIRECTAL  Final   Special Requests Normal  Final   Culture   Final    NO SALMONELLA, SHIGELLA, CAMPYLOBACTER, YERSINIA, OR E.COLI 0157:[id number] ISOLATED Performed at [facility]    Report Status 07/22/2014 FINAL  Final  Clostridium Difficile by PCR     Status: None   Collection Time: 07/24/14  4:18 AM  Result Value Ref Range Status   C difficile by pcr NEGATIVE NEGATIVE Final    Comment: Performed at [place] [facility]: Basic Metabolic Panel:  Recent Labs Lab 07/22/14 0554 07/23/14 0455 07/24/14 0452 07/25/14 0431 07/26/14 0445  NA 140 141 138 138 134*  K 3.6 3.3* 3.6 3.2* 3.0*  CL 107 108 107 106 99  CO2 22 25 22 25 23   GLUCOSE 109* 98 88 100* 100*  BUN 24* 19 15 13 18   CREATININE 1.26* 1.30* 1.28* 1.27* 1.31*  CALCIUM 8.9 8.4 8.7 8.6 8.5  MG  --   --  1.5  --   --    Liver Function Tests:  Recent Labs Lab 07/20/14 0218 07/25/14 0431  AST 16 18  ALT 10 12  ALKPHOS 57 63  BILITOT 0.4 0.5  PROT 5.7* 5.9*  ALBUMIN 3.3* 3.5    Recent Labs Lab 07/22/14 1225  LIPASE 32   No results for input(s): AMMONIA in the last 168 hours. CBC:  Recent Labs Lab 07/20/14 0218 07/22/14 0554 07/23/14 0455  07/24/14 0452 07/25/14 0431  WBC 9.3 11.4* 11.5* 10.9* 9.3  HGB 11.8* 10.9* 10.6* 11.0* 10.7*  HCT 36.4 34.0* 32.8* 33.9* 32.7*  MCV 91.9 92.6 91.6 91.4 91.6  PLT 203 191 178 194 206   Cardiac Enzymes:  Recent Labs Lab 07/20/14 0218  TROPONINI <0.03   BNP: BNP (last 3 results) No results for input(s): PROBNP in the last 8760 hours. CBG:  Recent Labs Lab 07/22/14 1144 07/22/14 1656 07/23/14 0743 07/23/14 1152 07/23/14 1705  GLUCAP 102* 103* 87 104* 97       Signed:  Janiel Derhammer A  [facility] 07/26/2014, 1:08 PM

## 2014-07-26 NOTE — Progress Notes (Signed)
     [facility] Progress Note  Subjective:  Wells good.  Tolerating soft diet for several days now.  No nausea or vomiting.  Very little abdominal pain; had a little discomfort after breakfast this morning.  Has been walking around the halls.  Objective:  Vital signs in last 24 hours: Temp:  [98.3 F (36.8 C)-98.6 F (37 C)] 98.3 F (36.8 C) (01/21 0520) Pulse Rate:  [64-73] 64 (01/21 0520) Resp:  [18-20] 20 (01/21 0520) BP: (126-140)/(48-62) 126/48 mmHg (01/21 0520) SpO2:  [97 %-100 %] 100 % (01/21 0520) Last BM Date: 07/25/14 General:  Alert, Well-developed, in NAD Heart:  Regular rate and rhythm; no murmurs Pulm: CTAB.  No W/R/R. Abdomen:  Soft, non-distended.  BS present.  Non-tender. Extremities:  Without edema. Neurologic:  Alert and  oriented x4;  grossly normal neurologically. Psych:  Alert and cooperative. Normal mood and affect.  Intake/Output from previous day: 01/20 0701 - 01/21 0700 In: 340 [P.O.:340] Out: 1720 [Urine:1720] Intake/Output this shift: Total I/O In: 240 [P.O.:240] Out: -   Lab Results:  Recent Labs  07/24/14 0452 07/25/14 0431  WBC 10.9* 9.3  HGB 11.0* 10.7*  HCT 33.9* 32.7*  PLT 194 206   BMET  Recent Labs  07/24/14 0452 07/25/14 0431 07/26/14 0445  NA 138 138 130*  K 3.6 3.2* 3.0*  CL 107 106 99  CO2 22 25 23   GLUCOSE 88 100* 100*  BUN 15 13 18   CREATININE 1.28* 1.27* 1.31*  CALCIUM 8.7 8.6 8.2*   LFT  Recent Labs  07/25/14 0431  PROT 5.9*  ALBUMIN 3.5  AST 18  ALT 12  ALKPHOS 63  BILITOT 0.5   Dg Abd 1 View  07/26/2014   CLINICAL DATA:  Small-bowel obstruction.  EXAM: ABDOMEN - 1 VIEW  COMPARISON:  07/24/2014.  FINDINGS: Soft tissue structures are unremarkable. Surgical internal quadrant. Dilated loops of small bowel remain. No definite unchanged. No free air. Pelvic phleboliths. Degenerative changes lumbar spine and both hips.  IMPRESSION: Persistent dilated loops of small bowel, no interim change.  No free air.   Electronically Signed   By: Marcello Moores  Register   On: 07/26/2014 07:28   Assessment / Plan: *81 yo female with SBO in midportion of small bowel, likely due to adhesions.  Continues to improve.  Abdominal x-ray today is unchanged, but clinically she is doing well and tolerating diet.  [place] for discharge today from GI standpoint; surgery already signed off.   LOS: 8 days   ZEHR, JESSICA D.  07/26/2014, 11:38 AM  Pager number [phone]   GI ATTENDING  Interval history data reviewed. Patient personally seen and examined. Son in room. Patient is doing quite well clinically. Still slightly dilated loops of small bowel on x-ray. However, tolerating diet without pain and no diarrhea. Okay for discharge. Thanks  Docia Chuck. Geri Seminole., M.D. [facility] Division of Gastroenterology

## 2014-07-27 LAB — GI PATHOGEN PANEL BY PCR, STOOL
C DIFFICILE TOXIN A/B: NOT DETECTED
Campylobacter by PCR: NOT DETECTED
Cryptosporidium by PCR: NOT DETECTED
E COLI (STEC): NOT DETECTED
E coli (ETEC) LT/ST: NOT DETECTED
E coli 0157 by PCR: NOT DETECTED
G lamblia by PCR: NOT DETECTED
Norovirus GI/GII: NOT DETECTED
ROTAVIRUS A BY PCR: NOT DETECTED
Salmonella by PCR: NOT DETECTED
Shigella by PCR: NOT DETECTED

## 2014-08-02 ENCOUNTER — Ambulatory Visit (INDEPENDENT_AMBULATORY_CARE_PROVIDER_SITE_OTHER): Payer: Medicare Other | Admitting: Family Medicine

## 2014-08-02 ENCOUNTER — Encounter: Payer: Self-pay | Admitting: Family Medicine

## 2014-08-02 VITALS — BP 120/70 | Temp 99.2°F | Wt 193.0 lb

## 2014-08-02 DIAGNOSIS — K5669 Other intestinal obstruction: Secondary | ICD-10-CM | POA: Diagnosis not present

## 2014-08-02 DIAGNOSIS — I10 Essential (primary) hypertension: Secondary | ICD-10-CM

## 2014-08-02 DIAGNOSIS — K56609 Unspecified intestinal obstruction, unspecified as to partial versus complete obstruction: Secondary | ICD-10-CM

## 2014-08-02 MED ORDER — FUROSEMIDE 20 MG PO TABS: 20.0000 mg | ORAL_TABLET | Freq: Every day | ORAL | Status: DC

## 2014-08-02 MED ORDER — AMLODIPINE BESYLATE 2.5 MG PO TABS
2.5000 mg | ORAL_TABLET | Freq: Every day | ORAL | Status: DC
Start: 2014-08-02 — End: 2015-06-24

## 2014-08-02 NOTE — Progress Notes (Signed)
   Subjective:    Patient ID: [name], female    DOB: [birth year], 81 y.o.   MRN: [id number]  HPI [name] is a 81 year old recently widowed,,,,,,,,, stander husband died last year from metastatic prostate cancer,,,,,, who comes in today for follow-up of an SBO. She was admitted the hospital with an SBO on January 13 and discharged the 21st. She resolved after she was given contrast for the CT scan. She then had some other metabolic problems it took a while to sort out. She was discharged on her usual medications.  She says she feels well. She does not recall any inciting event. She has had a history of intra-abdominal chemotherapy for the ovarian cancer many years ago. Bowel movements been normal she goes 2-3 times per day   Review of Systems    review of systems otherwise negative Objective:   Physical Exam  Well-developed well-nourished female no acute distress vital signs stable she's afebrile BP 07/25[birth year]1968 at 4:00 in the afternoon. She admits to be low lightheaded when she stands up      Assessment & Plan:  Small bowel obstruction resolved spontaneously  Hypertension,,,,,,, BP too low,,,,,,,,, decrease Norvasc 2.5 mg and decrease Lasix to 20 mg daily  BP check daily follow-up in 2 weeks

## 2014-08-02 NOTE — Progress Notes (Signed)
Pre visit review using our clinic review tool, if applicable. No additional management support is needed unless otherwise documented below in the visit note. 

## 2014-08-02 NOTE — Patient Instructions (Signed)
Decrease the Norvasc to 2.5 mg daily  Decrease the Lasix to 20 mg daily  BP check every morning  Follow-up in 2 weeks  Purchase a new digital blood pressure cuff   [employer]     [employer]  Walk 30 minutes daily  Continue your other medications

## 2014-08-03 ENCOUNTER — Telehealth: Payer: Self-pay | Admitting: Family Medicine

## 2014-08-03 LAB — CBC WITH DIFFERENTIAL/PLATELET
BASOS PCT: 0.7 % (ref 0.0–3.0)
Basophils Absolute: 0.1 10*3/uL (ref 0.0–0.1)
EOS PCT: 5.3 % — AB (ref 0.0–5.0)
Eosinophils Absolute: 0.4 10*3/uL (ref 0.0–0.7)
HCT: 34.8 % — ABNORMAL LOW (ref 36.0–46.0)
Hemoglobin: 11.5 g/dL — ABNORMAL LOW (ref 12.0–15.0)
LYMPHS PCT: 22.3 % (ref 12.0–46.0)
Lymphs Abs: 1.6 10*3/uL (ref 0.7–4.0)
MCHC: 33.1 g/dL (ref 30.0–36.0)
MCV: 90.3 fl (ref 78.0–100.0)
Monocytes Absolute: 0.8 10*3/uL (ref 0.1–1.0)
Monocytes Relative: 10.7 % (ref 3.0–12.0)
NEUTROS ABS: 4.5 10*3/uL (ref 1.4–7.7)
Neutrophils Relative %: 61 % (ref 43.0–77.0)
Platelets: 250 10*3/uL (ref 150.0–400.0)
RBC: 3.85 Mil/uL — ABNORMAL LOW (ref 3.87–5.11)
RDW: 15.5 % (ref 11.5–15.5)
WBC: 7.4 10*3/uL (ref 4.0–10.5)

## 2014-08-03 LAB — BASIC METABOLIC PANEL
BUN: 23 mg/dL (ref 6–23)
CO2: 24 mEq/L (ref 19–32)
CREATININE: 1.41 mg/dL — AB (ref 0.40–1.20)
Calcium: 9.5 mg/dL (ref 8.4–10.5)
Chloride: 103 mEq/L (ref 96–112)
GFR: 38.25 mL/min — AB (ref >60.00)
Glucose, Bld: 103 mg/dL — ABNORMAL HIGH (ref 70–99)
Potassium: 4 mEq/L (ref 3.5–5.1)
Sodium: 137 mEq/L (ref 135–145)

## 2014-08-03 NOTE — Telephone Encounter (Signed)
Pt is asking for a call back.She said Dr Sherren Mocha changed her to a lower dose  furosemide (LASIX) 20 MG tablet she was on 40mg . Sh said she is having some ankle swelling

## 2014-08-03 NOTE — Telephone Encounter (Signed)
Okay for patient to alternate days per Dr Sherren Mocha.  Patient does not need a refill at this time.

## 2014-08-16 ENCOUNTER — Ambulatory Visit (INDEPENDENT_AMBULATORY_CARE_PROVIDER_SITE_OTHER): Payer: Medicare Other | Admitting: Family Medicine

## 2014-08-16 ENCOUNTER — Encounter: Payer: Self-pay | Admitting: Family Medicine

## 2014-08-16 VITALS — BP 110/80 | Temp 98.1°F | Wt 185.0 lb

## 2014-08-16 DIAGNOSIS — I10 Essential (primary) hypertension: Secondary | ICD-10-CM | POA: Diagnosis not present

## 2014-08-16 NOTE — Progress Notes (Signed)
   Subjective:    Patient ID: [name], female    DOB: Jun 07, 1934, 81 y.o.   MRN: [id number]  HPI [name] is a 81 year old female recently widowed who comes in today for follow-up of hypertension  She was on a combination of 10 mg of Norvasc and a ACE inhibitor however her blood pressure was too low. We stopped that and started on 2.5 mg of Norvasc as a single dose along with Lasix 20 and 40 alternating. She comes back today and her blood pressure is 146/80 she feels fine   Review of Systems    review of systems negative Objective:   Physical Exam  Well-developed well-nourished female no acute distress vital signs stable she's afebrile BP right arm sitting position 140s 5/80      Assessment & Plan:  Hypertension ago continue current therapy

## 2014-08-16 NOTE — Progress Notes (Signed)
Pre visit review using our clinic review tool, if applicable. No additional management support is needed unless otherwise documented below in the visit note. 

## 2014-08-16 NOTE — Patient Instructions (Signed)
Continue the Norvasc 2.5 mg daily in the morning  Check your blood pressure weekly,,,,,,,,,,,, BP goal 140/90 or less  If you get an elevated reading turn the machine off squeeze out all the air,,,,,, and recheck your blood pressure  If you get an elevated reading again then check your blood pressure daily for 2 weeks.  At the end of 2 weeks if your blood pressures drop back to normal,,,,,,,,,,, then just check it weekly. If however you get consistent elevations of your blood pressure 2 weeks in a row bring the data and the machine and return to see me for follow-up

## 2014-08-20 DIAGNOSIS — N184 Chronic kidney disease, stage 4 (severe): Secondary | ICD-10-CM | POA: Diagnosis not present

## 2014-08-20 DIAGNOSIS — Z87440 Personal history of urinary (tract) infections: Secondary | ICD-10-CM | POA: Diagnosis not present

## 2014-08-20 DIAGNOSIS — N2581 Secondary hyperparathyroidism of renal origin: Secondary | ICD-10-CM | POA: Diagnosis not present

## 2014-08-20 DIAGNOSIS — I129 Hypertensive chronic kidney disease with stage 1 through stage 4 chronic kidney disease, or unspecified chronic kidney disease: Secondary | ICD-10-CM | POA: Diagnosis not present

## 2014-11-05 DIAGNOSIS — I129 Hypertensive chronic kidney disease with stage 1 through stage 4 chronic kidney disease, or unspecified chronic kidney disease: Secondary | ICD-10-CM | POA: Diagnosis not present

## 2014-11-05 DIAGNOSIS — D631 Anemia in chronic kidney disease: Secondary | ICD-10-CM | POA: Diagnosis not present

## 2014-11-05 DIAGNOSIS — R809 Proteinuria, unspecified: Secondary | ICD-10-CM | POA: Diagnosis not present

## 2014-11-05 DIAGNOSIS — M109 Gout, unspecified: Secondary | ICD-10-CM | POA: Diagnosis not present

## 2014-11-05 DIAGNOSIS — N184 Chronic kidney disease, stage 4 (severe): Secondary | ICD-10-CM | POA: Diagnosis not present

## 2014-11-06 ENCOUNTER — Other Ambulatory Visit: Payer: Self-pay | Admitting: *Deleted

## 2014-11-06 MED ORDER — ESTROGENS CONJUGATED 0.3 MG PO TABS: ORAL_TABLET | ORAL | Status: DC

## 2014-12-20 DIAGNOSIS — I129 Hypertensive chronic kidney disease with stage 1 through stage 4 chronic kidney disease, or unspecified chronic kidney disease: Secondary | ICD-10-CM | POA: Diagnosis not present

## 2015-03-20 DIAGNOSIS — I129 Hypertensive chronic kidney disease with stage 1 through stage 4 chronic kidney disease, or unspecified chronic kidney disease: Secondary | ICD-10-CM | POA: Diagnosis not present

## 2015-03-20 DIAGNOSIS — N184 Chronic kidney disease, stage 4 (severe): Secondary | ICD-10-CM | POA: Diagnosis not present

## 2015-03-20 DIAGNOSIS — N2581 Secondary hyperparathyroidism of renal origin: Secondary | ICD-10-CM | POA: Diagnosis not present

## 2015-03-20 DIAGNOSIS — D631 Anemia in chronic kidney disease: Secondary | ICD-10-CM | POA: Diagnosis not present

## 2015-03-20 DIAGNOSIS — R809 Proteinuria, unspecified: Secondary | ICD-10-CM | POA: Diagnosis not present

## 2015-03-29 DIAGNOSIS — Z23 Encounter for immunization: Secondary | ICD-10-CM | POA: Diagnosis not present

## 2015-05-31 ENCOUNTER — Other Ambulatory Visit: Payer: Self-pay | Admitting: Family Medicine

## 2015-06-11 ENCOUNTER — Other Ambulatory Visit: Payer: Self-pay | Admitting: Family Medicine

## 2015-06-12 DIAGNOSIS — H524 Presbyopia: Secondary | ICD-10-CM | POA: Diagnosis not present

## 2015-06-12 DIAGNOSIS — Z961 Presence of intraocular lens: Secondary | ICD-10-CM | POA: Diagnosis not present

## 2015-06-12 DIAGNOSIS — E119 Type 2 diabetes mellitus without complications: Secondary | ICD-10-CM | POA: Diagnosis not present

## 2015-06-12 DIAGNOSIS — H43813 Vitreous degeneration, bilateral: Secondary | ICD-10-CM | POA: Diagnosis not present

## 2015-06-17 ENCOUNTER — Other Ambulatory Visit (INDEPENDENT_AMBULATORY_CARE_PROVIDER_SITE_OTHER): Payer: Medicare Other

## 2015-06-17 DIAGNOSIS — E039 Hypothyroidism, unspecified: Secondary | ICD-10-CM | POA: Diagnosis not present

## 2015-06-17 DIAGNOSIS — Z0000 Encounter for general adult medical examination without abnormal findings: Secondary | ICD-10-CM

## 2015-06-17 DIAGNOSIS — E785 Hyperlipidemia, unspecified: Secondary | ICD-10-CM | POA: Diagnosis not present

## 2015-06-17 DIAGNOSIS — I519 Heart disease, unspecified: Secondary | ICD-10-CM

## 2015-06-17 DIAGNOSIS — D649 Anemia, unspecified: Secondary | ICD-10-CM | POA: Diagnosis not present

## 2015-06-17 LAB — HEPATIC FUNCTION PANEL
ALT: 11 U/L (ref 0–35)
AST: 15 U/L (ref 0–37)
Albumin: 4.5 g/dL (ref 3.5–5.2)
Alkaline Phosphatase: 76 U/L (ref 39–117)
BILIRUBIN DIRECT: 0.1 mg/dL (ref 0.0–0.3)
TOTAL PROTEIN: 6.6 g/dL (ref 6.0–8.3)
Total Bilirubin: 0.4 mg/dL (ref 0.2–1.2)

## 2015-06-17 LAB — LIPID PANEL
CHOL/HDL RATIO: 3
Cholesterol: 201 mg/dL — ABNORMAL HIGH (ref 0–200)
HDL: 70.7 mg/dL (ref >39.00)
LDL CALC: 106 mg/dL — AB (ref 0–99)
NonHDL: 130.5
TRIGLYCERIDES: 123 mg/dL (ref 0.0–149.0)
VLDL: 24.6 mg/dL (ref 0.0–40.0)

## 2015-06-17 LAB — CBC WITH DIFFERENTIAL/PLATELET
BASOS PCT: 0.4 % (ref 0.0–3.0)
Basophils Absolute: 0 10*3/uL (ref 0.0–0.1)
EOS ABS: 0.7 10*3/uL (ref 0.0–0.7)
Eosinophils Relative: 8.7 % — ABNORMAL HIGH (ref 0.0–5.0)
HEMATOCRIT: 41.4 % (ref 36.0–46.0)
HEMOGLOBIN: 13.3 g/dL (ref 12.0–15.0)
Lymphocytes Relative: 38.6 % (ref 12.0–46.0)
Lymphs Abs: 3 10*3/uL (ref 0.7–4.0)
MCHC: 32.2 g/dL (ref 30.0–36.0)
MCV: 94.2 fl (ref 78.0–100.0)
Monocytes Absolute: 0.7 10*3/uL (ref 0.1–1.0)
Monocytes Relative: 9.1 % (ref 3.0–12.0)
Neutro Abs: 3.3 10*3/uL (ref 1.4–7.7)
Neutrophils Relative %: 43.2 % (ref 43.0–77.0)
Platelets: 285 10*3/uL (ref 150.0–400.0)
RBC: 4.4 Mil/uL (ref 3.87–5.11)
RDW: 15.1 % (ref 11.5–15.5)
WBC: 7.7 10*3/uL (ref 4.0–10.5)

## 2015-06-17 LAB — BASIC METABOLIC PANEL
BUN: 48 mg/dL — AB (ref 6–23)
CHLORIDE: 106 meq/L (ref 96–112)
CO2: 28 meq/L (ref 19–32)
Calcium: 10 mg/dL (ref 8.4–10.5)
Creatinine, Ser: 1.57 mg/dL — ABNORMAL HIGH (ref 0.40–1.20)
GFR: 33.72 mL/min — ABNORMAL LOW (ref >60.00)
Glucose, Bld: 95 mg/dL (ref 70–99)
Potassium: 4.6 mEq/L (ref 3.5–5.1)
Sodium: 143 mEq/L (ref 135–145)

## 2015-06-17 LAB — POCT URINALYSIS DIPSTICK
Bilirubin, UA: NEGATIVE
GLUCOSE UA: NEGATIVE
Ketones, UA: NEGATIVE
Nitrite, UA: NEGATIVE
PROTEIN UA: NEGATIVE
Spec Grav, UA: 1.02
Urobilinogen, UA: 0.2
pH, UA: 5.5

## 2015-06-17 LAB — TSH: TSH: 3.07 u[IU]/mL (ref 0.35–4.50)

## 2015-06-18 ENCOUNTER — Other Ambulatory Visit: Payer: Self-pay | Admitting: Family Medicine

## 2015-06-24 ENCOUNTER — Ambulatory Visit (INDEPENDENT_AMBULATORY_CARE_PROVIDER_SITE_OTHER): Payer: Medicare Other | Admitting: Family Medicine

## 2015-06-24 ENCOUNTER — Encounter: Payer: Self-pay | Admitting: Family Medicine

## 2015-06-24 VITALS — BP 140/90 | Temp 97.7°F | Ht 62.75 in | Wt 188.0 lb

## 2015-06-24 DIAGNOSIS — E039 Hypothyroidism, unspecified: Secondary | ICD-10-CM | POA: Diagnosis not present

## 2015-06-24 DIAGNOSIS — E785 Hyperlipidemia, unspecified: Secondary | ICD-10-CM | POA: Diagnosis not present

## 2015-06-24 DIAGNOSIS — M109 Gout, unspecified: Secondary | ICD-10-CM

## 2015-06-24 DIAGNOSIS — I10 Essential (primary) hypertension: Secondary | ICD-10-CM | POA: Diagnosis not present

## 2015-06-24 DIAGNOSIS — J309 Allergic rhinitis, unspecified: Secondary | ICD-10-CM | POA: Diagnosis not present

## 2015-06-24 DIAGNOSIS — M1009 Idiopathic gout, multiple sites: Secondary | ICD-10-CM

## 2015-06-24 DIAGNOSIS — M1990 Unspecified osteoarthritis, unspecified site: Secondary | ICD-10-CM

## 2015-06-24 MED ORDER — POTASSIUM CHLORIDE CRYS ER 20 MEQ PO TBCR: 20.0000 meq | EXTENDED_RELEASE_TABLET | Freq: Every day | ORAL | Status: DC

## 2015-06-24 MED ORDER — SULFAMETHOXAZOLE-TRIMETHOPRIM 800-160 MG PO TABS: 1.0000 | ORAL_TABLET | Freq: Two times a day (BID) | ORAL | Status: DC

## 2015-06-24 MED ORDER — AMLODIPINE BESYLATE 5 MG PO TABS: 5.0000 mg | ORAL_TABLET | Freq: Every day | ORAL | Status: DC

## 2015-06-24 MED ORDER — FUROSEMIDE 40 MG PO TABS: 40.0000 mg | ORAL_TABLET | Freq: Every day | ORAL | Status: DC

## 2015-06-24 MED ORDER — LEVOTHYROXINE SODIUM 50 MCG PO TABS: ORAL_TABLET | ORAL | Status: DC

## 2015-06-24 MED ORDER — ALLOPURINOL 100 MG PO TABS: 100.0000 mg | ORAL_TABLET | Freq: Two times a day (BID) | ORAL | Status: DC

## 2015-06-24 MED ORDER — VALSARTAN 80 MG PO TABS: 80.0000 mg | ORAL_TABLET | Freq: Every day | ORAL | Status: DC

## 2015-06-24 MED ORDER — ESTROGENS CONJUGATED 0.3 MG PO TABS
ORAL_TABLET | ORAL | Status: DC
Start: 2015-06-24 — End: 2016-06-25

## 2015-06-24 MED ORDER — SIMVASTATIN 20 MG PO TABS: 20.0000 mg | ORAL_TABLET | Freq: Every day | ORAL | Status: DC

## 2015-06-24 NOTE — Progress Notes (Signed)
Pre visit review using our clinic review tool, if applicable. No additional management support is needed unless otherwise documented below in the visit note. 

## 2015-06-24 NOTE — Progress Notes (Signed)
Subjective:    Patient ID: [name], female    DOB: Sep 12, 1933, 81 y.o.   MRN: [id number]  HPI    [name]  Is a 81 year old widowed female nonsmoker........ Her husband stand died spring 2014 for metastatic prostate cancer...Marland KitchenMarland KitchenMarland Kitchen Who comes in today for general physical examination because of a history of hypertension, gout, allergic rhinitis, postmenopausal hot flashes and vaginal dryness, hypothyroidism , hyperlipidemia  Her blood pressures been treated with Norvasc 5 mg, valsartan 80 mg, and Lasix 40 mg. She takes it 20 mEq potassium supplement. Her blood pressure days 140/90. Her blood pressure at home ranges from 150/85-120/60.   Her BUNs 48 creatinine 1.5 and GFR is 33. She's due to see Dr. Jeneen Rinks D her nephrologist in January. I wonder if we should decrease the Lasix. I'll leave that up to him.    she gets routine eye care, dental care, last mammogram August 2015, colonoscopy 2000. She's reluctant to get a colonoscopy because of the prep. She had a small bowel obstruction last winter. Bowel habits are normal no bleeding no family history of colon cancer or polyps.   Vaccinations are up-to-date including a shingles vaccine. Flu shot was September 2016.   Her weight is 185 pounds. We've discussed diet exercise and weight loss every year for the last 30 years. Her weight is a same. She says she walks 20 minutes daily but she's very sedentary  Cognitive function normal she does not exercise on a regular basis, home health safety reviewed no issues identified, no guns in the house, she's not sure she has a healthcare power of attorney and living well. She'll check into that.   Other meds reviewed and there accurate   Social history her husband stand died 2 years ago as noted above. 3 children one who lives in [place] here in [facility] also knows some burning on urination last couple days. Urinalysis shows 3+ white cells. Review of Systems  Constitutional: Negative.     HENT: Negative.   Eyes: Negative.   Respiratory: Negative.   Cardiovascular: Negative.   Gastrointestinal: Negative.   Endocrine: Negative.   Genitourinary: Negative.   Musculoskeletal: Negative.   Skin: Negative.   Allergic/Immunologic: Negative.   Neurological: Negative.   Hematological: Negative.   Psychiatric/Behavioral: Negative.        Objective:   Physical Exam  Constitutional: She is oriented to person, place, and time. She appears well-developed and well-nourished.  HENT:  Head: Normocephalic and atraumatic.  Right Ear: External ear normal.  Left Ear: External ear normal.  Nose: Nose normal.  Mouth/Throat: Oropharynx is clear and moist.  Eyes: EOM are normal. Pupils are equal, round, and reactive to light.  Neck: Normal range of motion. Neck supple. No JVD present. No tracheal deviation present. No thyromegaly present.  Cardiovascular: Normal rate, regular rhythm, normal heart sounds and intact distal pulses.  Exam reveals no gallop and no friction rub.   No murmur heard. No carotid neurologic bruits peripheral pulses 2+ and symmetrical  Pulmonary/Chest: Effort normal and breath sounds normal. No stridor. No respiratory distress. She has no wheezes. She has no rales. She exhibits no tenderness.  Abdominal: Soft. Bowel sounds are normal. She exhibits no distension and no mass. There is no tenderness. There is no rebound and no guarding.  Genitourinary:  Bilateral breast exam shows a bruised left breast 12:00 position just above the nipple. She denies any history of trauma. Underneath the bruise there is a Robin eggs size mass.  It's firm and nontender. Advised to go back for her mammogram ASAP  Musculoskeletal: Normal range of motion. She exhibits no edema or tenderness.  Lymphadenopathy:    She has no cervical adenopathy.  Neurological: She is alert and oriented to person, place, and time. She has normal reflexes. No cranial nerve deficit. She exhibits normal muscle  tone. Coordination normal.  Skin: Skin is warm and dry. No rash noted. No erythema. No pallor.  Psychiatric: She has a normal mood and affect. Her behavior is normal. Judgment and thought content normal.  Nursing note and vitals reviewed.         Assessment & Plan:  Hypertension at goal...... however decreasing renal function........ continue current medications for now follow-up with nephrologist in 3 weeks  Obesity,,,,,,,, again encouraged diet exercise and weight loss  History of gout,,,,,,,, continue allopurinol  Allergic rhinitis,,,,,,,,,,,,, continue OTC medications  Hypothyroidism........ continue Synthroid  Hyperlipidemia........... continue Zocor and aspirin  Postmenopausal............ continue calcium vitamin D and Premarin 0.3 twice daily  Mass left breast...........Marland Kitchen refer back to the breast center for further evaluation  Urinary tract infection.......... Septra twice a day for 1 week

## 2015-06-24 NOTE — Patient Instructions (Signed)
Continue current medicine nasal spray. Take a copy of all your labs when you see Dr. Jeneen Rinks D to see if he wants to change the Lasix  Walk 30 minutes daily  Call the breast center make an appointment to get your screening mammogram  Tommi Rumps or Almyra Free are 2 new adult nurse practitioner's or Dr. Martinique

## 2015-07-04 ENCOUNTER — Encounter: Payer: Self-pay | Admitting: *Deleted

## 2015-07-10 ENCOUNTER — Other Ambulatory Visit: Payer: Self-pay

## 2015-08-19 ENCOUNTER — Telehealth: Payer: Self-pay | Admitting: Family Medicine

## 2015-08-19 DIAGNOSIS — Z1239 Encounter for other screening for malignant neoplasm of breast: Secondary | ICD-10-CM

## 2015-08-19 DIAGNOSIS — Z87898 Personal history of other specified conditions: Secondary | ICD-10-CM

## 2015-08-19 DIAGNOSIS — Z1231 Encounter for screening mammogram for malignant neoplasm of breast: Secondary | ICD-10-CM

## 2015-08-19 DIAGNOSIS — N644 Mastodynia: Secondary | ICD-10-CM

## 2015-08-19 NOTE — Telephone Encounter (Signed)
Order placed

## 2015-08-19 NOTE — Telephone Encounter (Signed)
Pt call today she was trying to schedule her Mammogram at the breast center and she needs a referral. She said Dr Sherren Mocha told her to schedule this appt when she had her physical in Dec 2016

## 2015-08-27 ENCOUNTER — Telehealth: Payer: Self-pay | Admitting: Family Medicine

## 2015-08-27 DIAGNOSIS — N6320 Unspecified lump in the left breast, unspecified quadrant: Secondary | ICD-10-CM

## 2015-08-27 NOTE — Telephone Encounter (Signed)
Order placed

## 2015-08-27 NOTE — Telephone Encounter (Signed)
Cherish calling from the Breast center stated patient said she had a lump on her breast and on the referral is said nothing about a lump. Please change it to diagnostic and ultrasound please. [phone].

## 2015-08-29 ENCOUNTER — Other Ambulatory Visit: Payer: Self-pay | Admitting: Family Medicine

## 2015-08-29 DIAGNOSIS — N6320 Unspecified lump in the left breast, unspecified quadrant: Secondary | ICD-10-CM

## 2015-08-30 ENCOUNTER — Telehealth: Payer: Self-pay | Admitting: Family Medicine

## 2015-08-30 DIAGNOSIS — N6320 Unspecified lump in the left breast, unspecified quadrant: Secondary | ICD-10-CM

## 2015-08-30 NOTE — Telephone Encounter (Signed)
-----   Message from Sandre Kitty sent at 08/29/2015 11:46 AM EST ----- Regarding: FW: Updated orders needed Contact: [phone] Thank you for entering the order for the u/s; however, the order should be for the LTD exam, not complete.   The imaging code for this exam is [id number].    Thank you. ----- Message -----    From: Sandre Kitty    Sent: 08/27/2015   2:39 PM      To: Dorena Cookey, MD Subject: Updated orders needed                          This patient is due for her annual mammogram at this time; therefore, she needs to have a bilateral diagnostic mammogram and u/s.  Please update the orders in Epic.  [id number] & [id number].  The patient will be contacted & scheduled once the orders are updated.  Please call with any questions.    Thank you!

## 2015-09-10 DIAGNOSIS — E782 Mixed hyperlipidemia: Secondary | ICD-10-CM | POA: Diagnosis not present

## 2015-09-10 DIAGNOSIS — Z87440 Personal history of urinary (tract) infections: Secondary | ICD-10-CM | POA: Diagnosis not present

## 2015-09-10 DIAGNOSIS — I129 Hypertensive chronic kidney disease with stage 1 through stage 4 chronic kidney disease, or unspecified chronic kidney disease: Secondary | ICD-10-CM | POA: Diagnosis not present

## 2015-09-10 DIAGNOSIS — M109 Gout, unspecified: Secondary | ICD-10-CM | POA: Diagnosis not present

## 2015-09-10 DIAGNOSIS — N184 Chronic kidney disease, stage 4 (severe): Secondary | ICD-10-CM | POA: Diagnosis not present

## 2015-09-10 DIAGNOSIS — N2581 Secondary hyperparathyroidism of renal origin: Secondary | ICD-10-CM | POA: Diagnosis not present

## 2015-09-10 DIAGNOSIS — E038 Other specified hypothyroidism: Secondary | ICD-10-CM | POA: Diagnosis not present

## 2015-09-10 DIAGNOSIS — M1990 Unspecified osteoarthritis, unspecified site: Secondary | ICD-10-CM | POA: Diagnosis not present

## 2015-09-10 DIAGNOSIS — D631 Anemia in chronic kidney disease: Secondary | ICD-10-CM | POA: Diagnosis not present

## 2015-09-10 DIAGNOSIS — R809 Proteinuria, unspecified: Secondary | ICD-10-CM | POA: Diagnosis not present

## 2015-09-11 ENCOUNTER — Other Ambulatory Visit: Payer: Self-pay | Admitting: Family Medicine

## 2015-09-11 ENCOUNTER — Ambulatory Visit
Admission: RE | Admit: 2015-09-11 | Discharge: 2015-09-11 | Disposition: A | Discharge status: Home / Self Care | Payer: Medicare Other | Source: Ambulatory Visit | Attending: Family Medicine | Admitting: Family Medicine

## 2015-09-11 DIAGNOSIS — N6320 Unspecified lump in the left breast, unspecified quadrant: Secondary | ICD-10-CM

## 2015-09-11 DIAGNOSIS — N63 Unspecified lump in breast: Secondary | ICD-10-CM | POA: Diagnosis not present

## 2015-09-18 ENCOUNTER — Ambulatory Visit
Admission: RE | Admit: 2015-09-18 | Discharge: 2015-09-18 | Disposition: A | Discharge status: Home / Self Care | Payer: Medicare Other | Source: Ambulatory Visit | Attending: Family Medicine | Admitting: Family Medicine

## 2015-09-18 ENCOUNTER — Other Ambulatory Visit (HOSPITAL_COMMUNITY)
Admission: RE | Admit: 2015-09-18 | Discharge: 2015-09-18 | Disposition: A | Discharge status: Home / Self Care | Payer: Medicare Other | Source: Ambulatory Visit | Attending: Diagnostic Radiology | Admitting: Diagnostic Radiology

## 2015-09-18 ENCOUNTER — Other Ambulatory Visit: Payer: Self-pay | Admitting: Family Medicine

## 2015-09-18 DIAGNOSIS — N63 Unspecified lump in breast: Secondary | ICD-10-CM | POA: Diagnosis not present

## 2015-09-18 DIAGNOSIS — N6002 Solitary cyst of left breast: Secondary | ICD-10-CM | POA: Diagnosis not present

## 2015-09-18 DIAGNOSIS — N6320 Unspecified lump in the left breast, unspecified quadrant: Secondary | ICD-10-CM

## 2015-11-08 IMAGING — CR DG ABDOMEN 2V
3 series · 3 of 3 positions shown · non-contrast
Comparison: 07/22/2014

CLINICAL DATA: Followup small bowel obstruction. Diarrhea all last
night. No vomiting since [REDACTED] 07/21/2014.

EXAM:
ABDOMEN - 2 VIEW

[w abdomen upright]
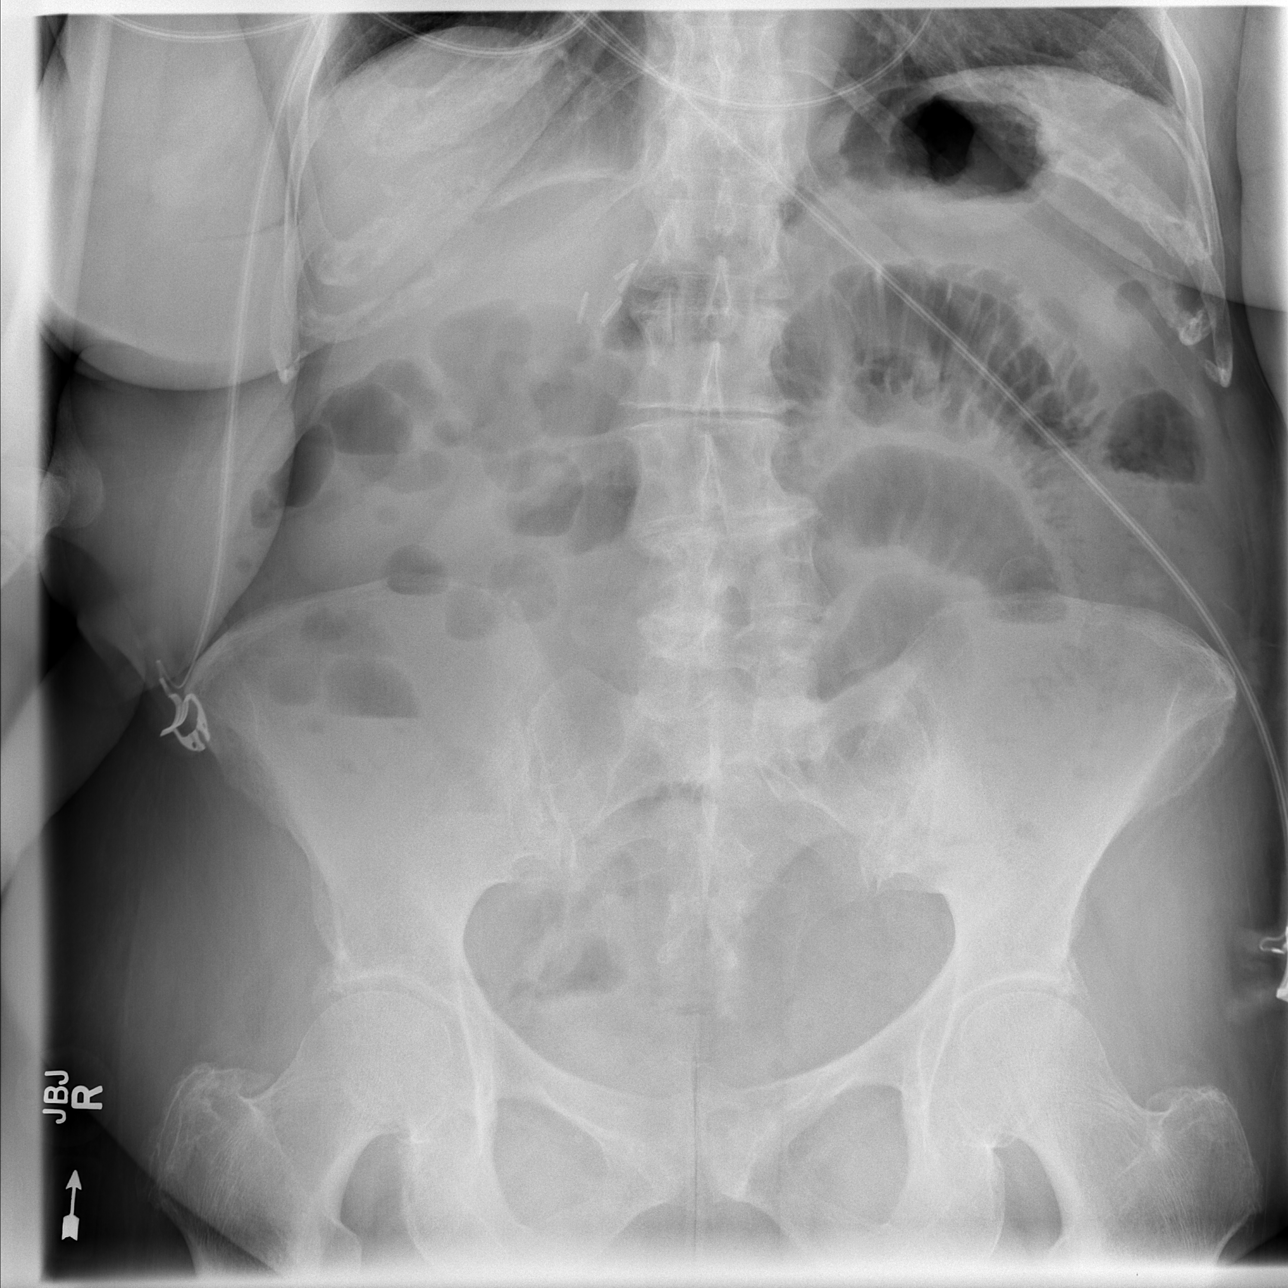

[t abdomen supine (1 of 2)]
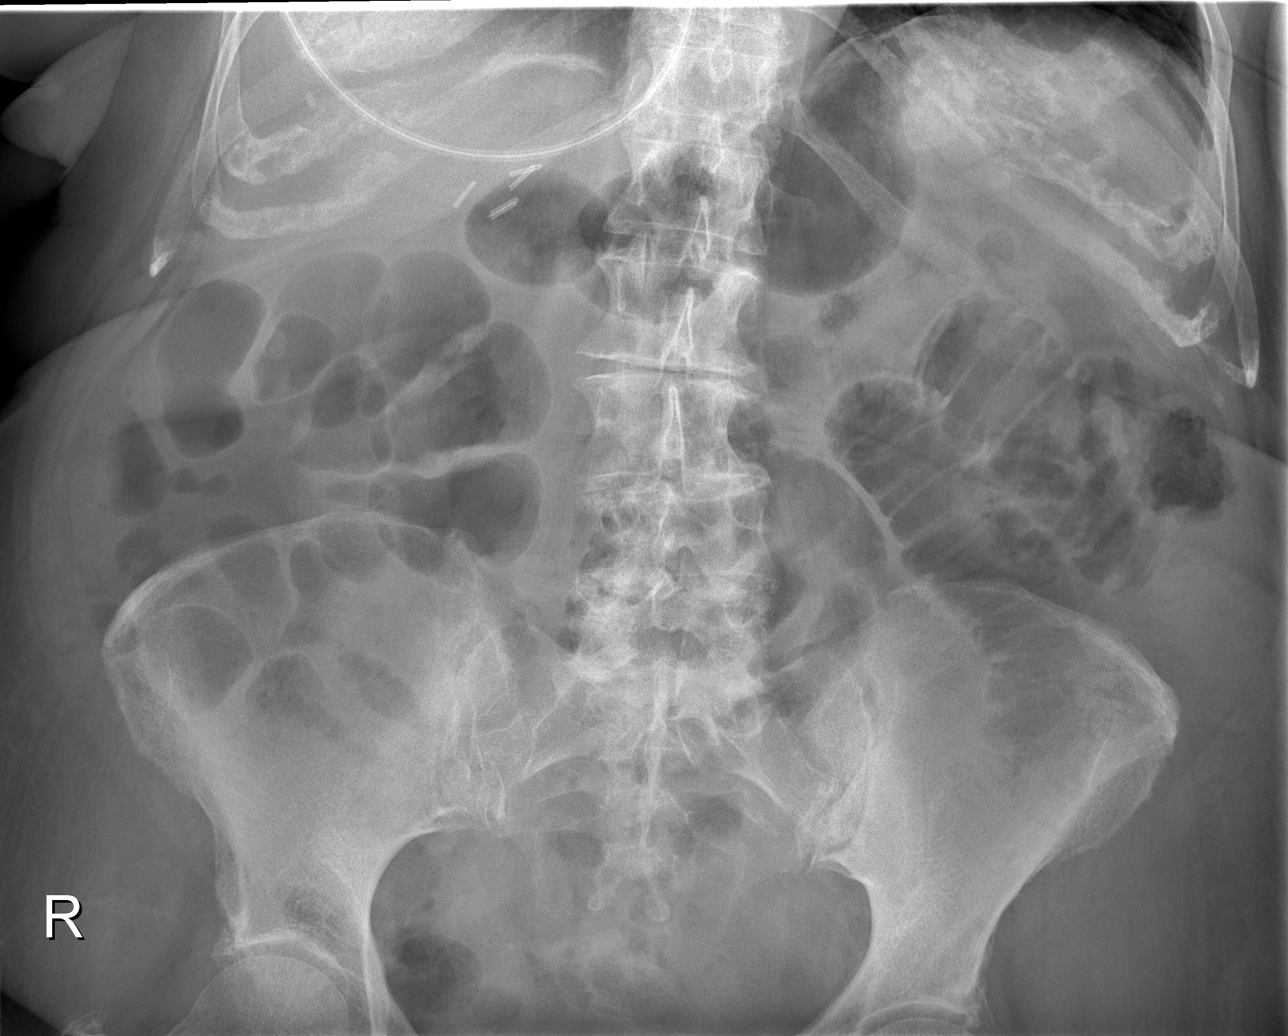

[t abdomen supine (2 of 2)]
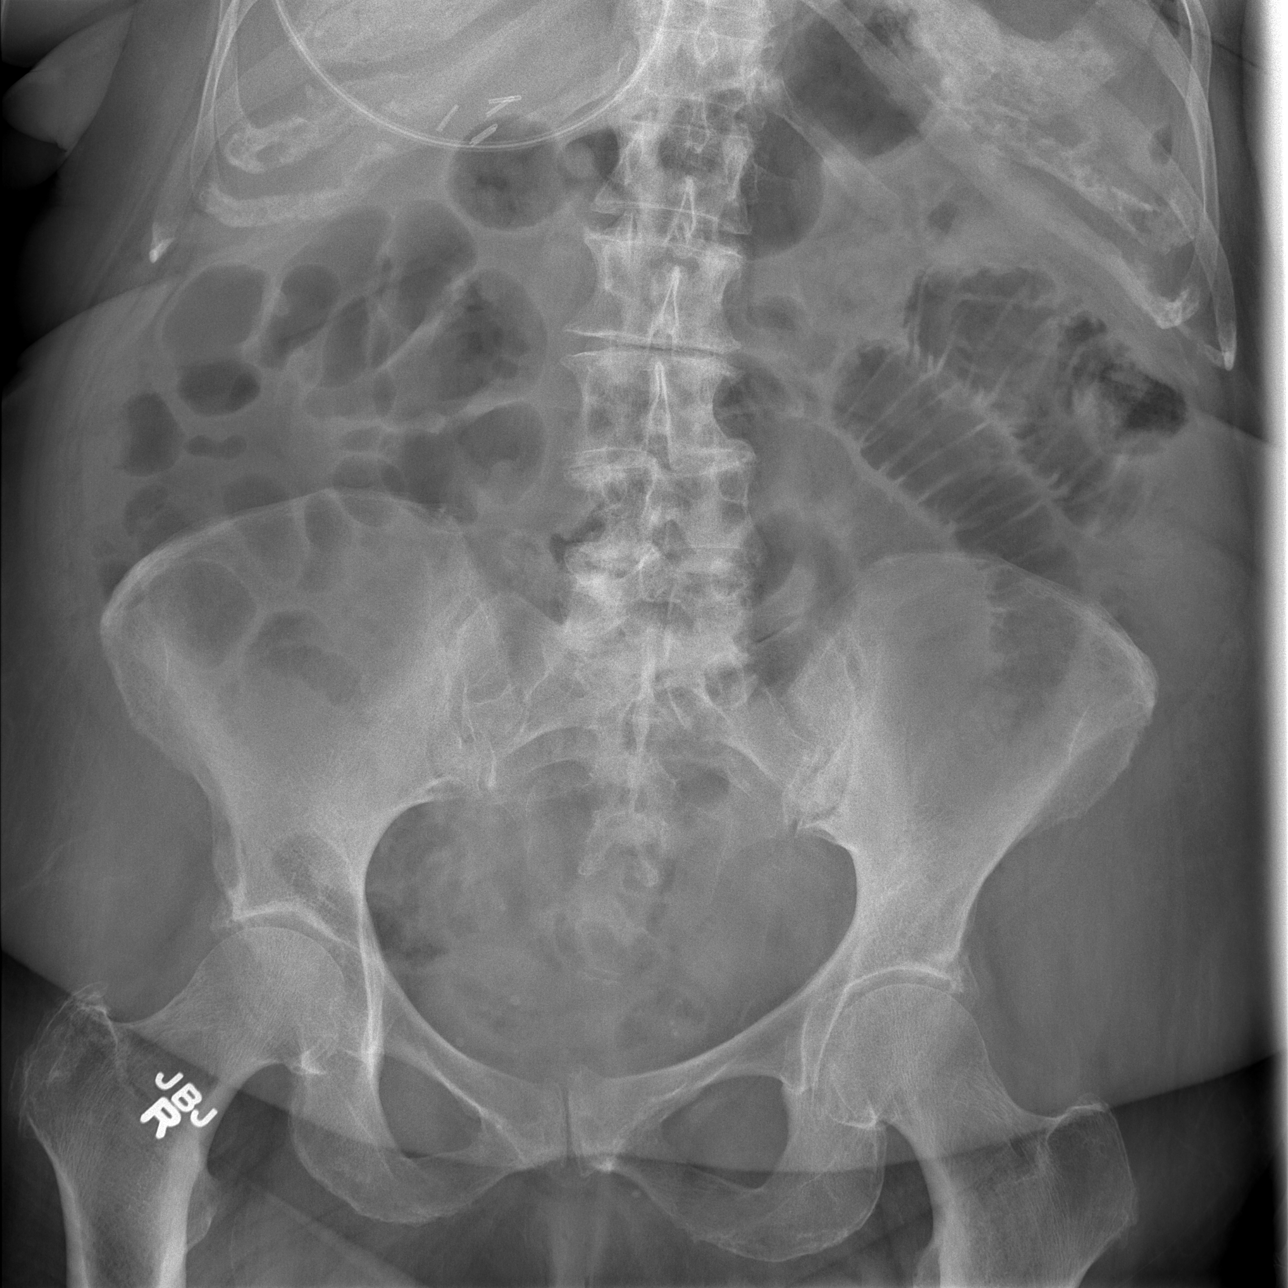

[3 of 3 positions shown; findings below may reference images not displayed]

FINDINGS: Bowel gas pattern demonstrates continued dilated air-filled small
bowel loops in the left abdomen which had decreased in diameter
measuring approximately 4.7 cm (previously 6 cm). Few scattered
nonspecific air-fluid levels over the right abdomen. Less number of
dilated air-filled small bowel loops. No free peritoneal air.
Moderate air within the right colon. Remainder of the exam is
unchanged.
IMPRESSION: Interval improvement in patient's partial small bowel obstructive
process.

## 2015-11-10 IMAGING — DX DG ABDOMEN 1V
2 series · 2 of 2 positions shown · non-contrast
Comparison: 07/24/2014.

CLINICAL DATA: Small-bowel obstruction.

EXAM:
ABDOMEN - 1 VIEW

[abdomen kub]
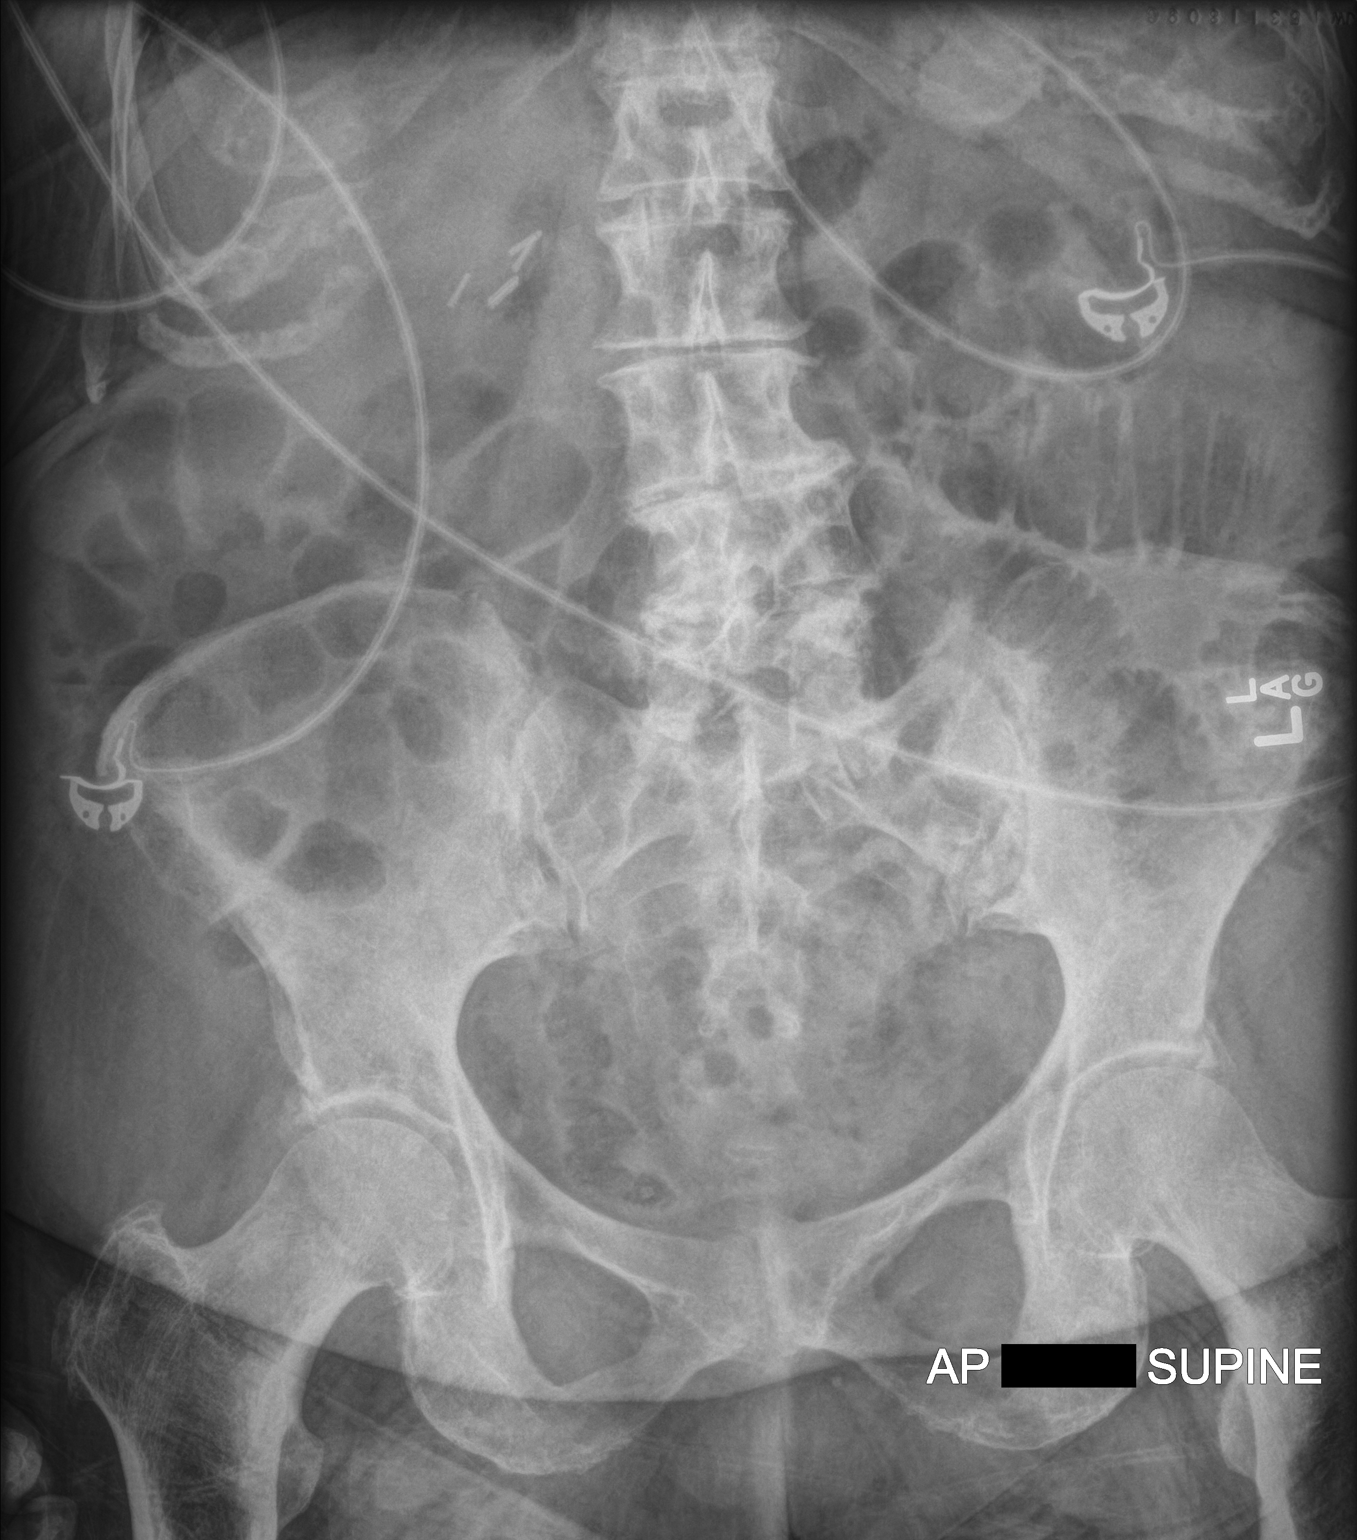

[abdomen supine]
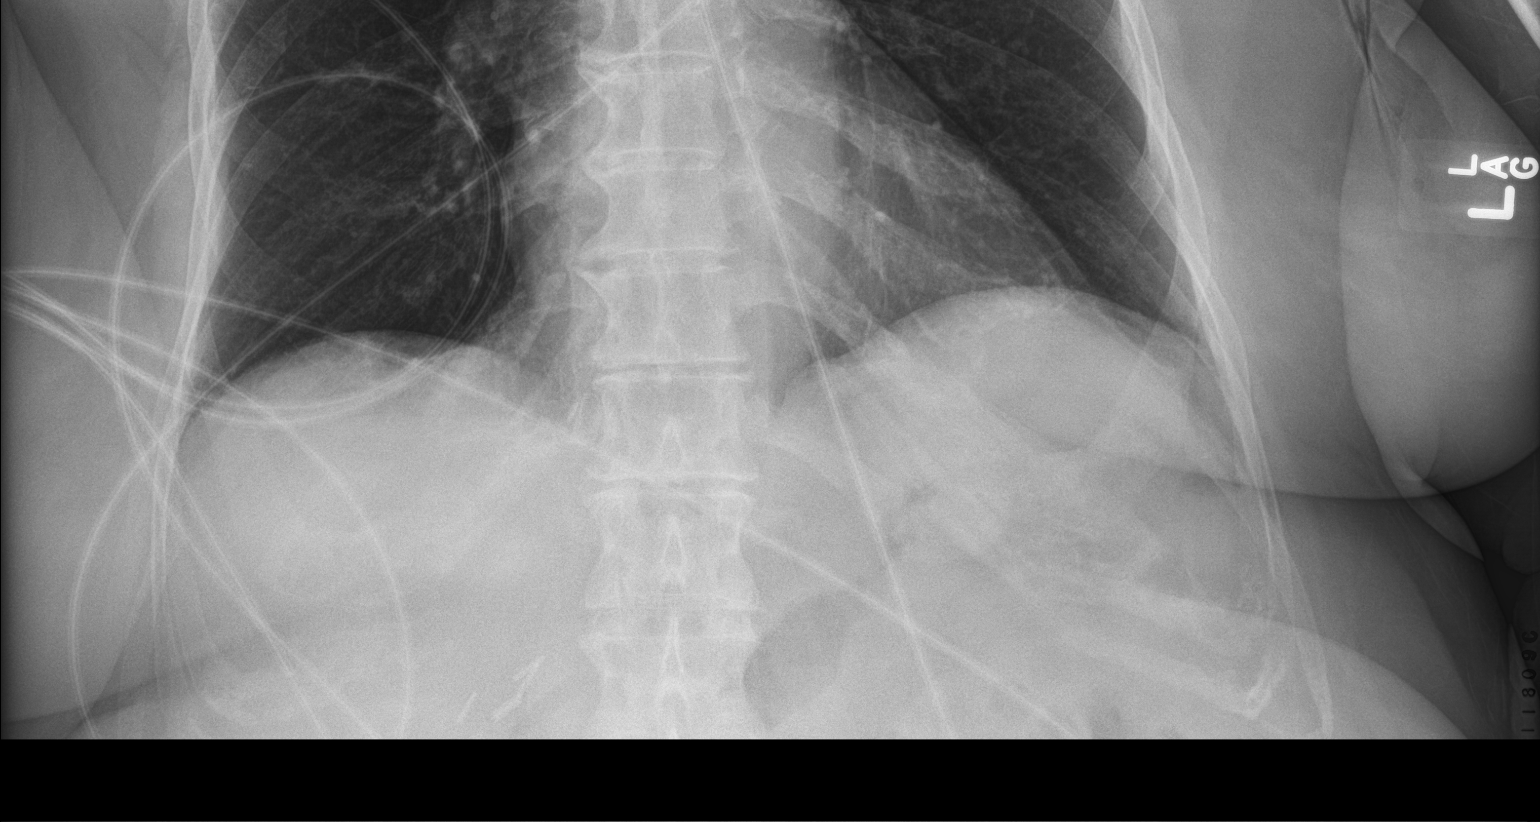

[2 of 2 positions shown; findings below may reference images not displayed]

FINDINGS: Soft tissue structures are unremarkable. Surgical internal quadrant.
Dilated loops of small bowel remain. No definite unchanged. No free
air. Pelvic phleboliths. Degenerative changes lumbar spine and both
hips.
IMPRESSION: Persistent dilated loops of small bowel, no interim change. No free
air.

## 2016-02-13 ENCOUNTER — Ambulatory Visit (INDEPENDENT_AMBULATORY_CARE_PROVIDER_SITE_OTHER): Payer: Medicare Other | Admitting: Family Medicine

## 2016-02-13 ENCOUNTER — Encounter: Payer: Self-pay | Admitting: Family Medicine

## 2016-02-13 VITALS — BP 130/80 | HR 89 | Resp 12 | Ht 62.75 in | Wt 191.5 lb

## 2016-02-13 DIAGNOSIS — R232 Flushing: Secondary | ICD-10-CM | POA: Diagnosis not present

## 2016-02-13 DIAGNOSIS — N183 Chronic kidney disease, stage 3 unspecified: Secondary | ICD-10-CM

## 2016-02-13 DIAGNOSIS — I10 Essential (primary) hypertension: Secondary | ICD-10-CM

## 2016-02-13 NOTE — Patient Instructions (Addendum)
A few things to remember from today's visit:   Essential hypertension  CKD (chronic kidney disease) stage 3, GFR 30-59 ml/min  Hot flashes not due to menopause  [name], today we have followed on some of your chronic medical problems and they seem to be stable, so no changes in current management today.  Review medication list and be sure if is accurate.  -Remember a healthy diet and regular physical activity are very important for prevention as well as for well being; they also help with many chronic problems, decreasing the need of adding new medications and delaying or preventing possible complications.  Remember to arrange your follow up appt before leaving today. Please follow sooner than planned if a new concern arises.   Since you are also following with your kidney I think you can follow up with me once per year, before if needed. I will see her back in December when you are doing physical.   Medicare covers a annual preventive visit, which is strongly recommended , it is once per year and involves a series of questions to identify risk factors; so we can try to prevent possible complications. This does not need to be done by a doctor.  We have a nurse [employer] banker[employer]) here that is highly qualified to do it, it can be arrange same date you have a follow up appointment with me or labs scheduled, and it 100% covered by Medicare. So before you leave today I would like for you to arrange visit with Ms [name] for Medicare wellness visit.   If a new problem present, please set up appointment sooner than planned today.

## 2016-02-13 NOTE — Progress Notes (Signed)
HPI:   [name] is a 82 y.o. female, who is here today to establish care with me.  Former PCP: Dr Sherren Mocha Last preventive routine visit: 06/2015.  Concerns today: None  Hypertension: Currently she is on Diovan 80 mg daily,Furosemide 40 mg daily, and Amlodipine 5 mg daily.  She taking medications as instructed, no side effects reported.  She has not noted unusual headache, visual changes, exertional chest pain, dyspnea,  focal weakness, or edema.  Lab Results  Component Value Date   CREATININE 1.57 (H) 06/17/2015   BUN 48 (H) 06/17/2015   NA 143 06/17/2015   K 4.6 06/17/2015   CL 106 06/17/2015   CO2 28 06/17/2015   Hx of CKD III, she follows with nephrologists q 3-4 months. She has not noted gross hematuria or foam in urine.  Next appt in 03/2016, she usually has blood and urine lab work done every visit. HypoK+ on K+ supplementation. Denies Hx of CHF, states that Furosemide was prescribed for HTN.   HLD on Zocor 20 mg daily. Tolerating medication well.  Lab Results  Component Value Date   CHOL 201 (H) 06/17/2015   HDL 70.70 06/17/2015   LDLCALC 106 (H) 06/17/2015   LDLDIRECT 91.4 02/03/2010   TRIG 123.0 06/17/2015   CHOLHDL 3 06/17/2015    Hx of hypothyroidism.   Lab Results  Component Value Date   TSH 3.07 06/17/2015   Hx of ovarian cancer at 64.  History of hot flashes, currently she is on Premarin 0.3 mg Mondays and Fridays, over the years the frequency has been decreased and these dose is helping with symptoms. It was a concern about breast abnormality in 06/2015; according to patient biopsy of breast was reported as not malignancy, "cyst."  Hx of gout and OA. No falls in the past year.   Review of Systems  Constitutional: Negative for activity change, appetite change, fatigue, fever and unexpected weight change.  HENT: Negative for mouth sores, nosebleeds and trouble swallowing.   Eyes: Negative for redness and visual disturbance.   Respiratory: Negative for cough, shortness of breath and wheezing.   Cardiovascular: Negative for chest pain, palpitations and leg swelling.  Gastrointestinal: Negative for abdominal pain, nausea and vomiting.       Negative for changes in bowel habits.  Genitourinary: Negative for decreased urine volume, difficulty urinating and hematuria.  Musculoskeletal: Positive for arthralgias. Negative for myalgias.  Skin: Negative for color change and rash.  Neurological: Negative for seizures, syncope, weakness, numbness and headaches.  Psychiatric/Behavioral: Negative for confusion. The patient is not nervous/anxious.       Current Outpatient Prescriptions on File Prior to Visit  Medication Sig Dispense Refill  . acetaminophen (TYLENOL) 650 MG CR tablet Take 650 mg by mouth every 8 (eight) hours as needed for pain (pain).     Marland Kitchen allopurinol (ZYLOPRIM) 100 MG tablet Take 1 tablet (100 mg total) by mouth 2 (two) times daily. 200 tablet 3  . amLODipine (NORVASC) 5 MG tablet Take 1 tablet (5 mg total) by mouth daily. 100 tablet 3  . aspirin 81 MG EC tablet Take 81 mg by mouth daily.      . calcitRIOL (ROCALTROL) 0.25 MCG capsule Take 0.25 mcg by mouth daily.     . calcium carbonate (TUMS) 500 MG chewable tablet Chew 1 tablet by mouth daily.      . cetirizine (ZYRTEC) 10 MG tablet Take 10 mg by mouth daily.      Marland Kitchen  estrogens, conjugated, (PREMARIN) 0.3 MG tablet One tablet twice per week 100 tablet 2  . furosemide (LASIX) 40 MG tablet Take 1 tablet (40 mg total) by mouth daily. 100 tablet 3  . levothyroxine (SYNTHROID, LEVOTHROID) 50 MCG tablet take 1 tablet by mouth DAILY THEN 1/2 TABLET THE NEXT DAY ROTATING 150 tablet 3  . potassium chloride SA (K-DUR,KLOR-CON) 20 MEQ tablet Take 1 tablet (20 mEq total) by mouth daily. 100 tablet 3  . simvastatin (ZOCOR) 20 MG tablet Take 1 tablet (20 mg total) by mouth at bedtime. 100 tablet 3  . valsartan (DIOVAN) 80 MG tablet Take 1 tablet (80 mg total) by mouth  daily. 100 tablet 3   No current facility-administered medications on file prior to visit.      Past Medical History:  Diagnosis Date  . Allergy   . Arthritis   . Broken foot    left  . Cancer ([place])    ovarian  . Hyperlipidemia   . Hypertension   . Obesity    Allergies  Allergen Reactions  . Shellfish Allergy     Family History  Problem Relation Age of Onset  . Diabetes      fhx  . Hypertension      fhx  . Obesity      fhx    Social History   Social History  . Marital status: Married    Spouse name: N/A  . Number of children: N/A  . Years of education: N/A   Social History Main Topics  . Smoking status: Never Smoker  . Smokeless tobacco: Never Used  . Alcohol use No  . Drug use: No  . Sexual activity: Not Asked   Other Topics Concern  . None   Social History Narrative  . None    Vitals:   02/13/16 1440  BP: 130/80  Pulse: 89  Resp: 12    Body mass index is 34.19 kg/m.  O2 sat at RA 98%.     Physical Exam  Nursing note and vitals reviewed. Constitutional: She is oriented to person, place, and time. She appears well-developed. No distress.  HENT:  Head: Atraumatic.  Mouth/Throat: Oropharynx is clear and moist and mucous membranes are normal.  Eyes: Conjunctivae and EOM are normal. Pupils are equal, round, and reactive to light.  Cardiovascular: Normal rate and regular rhythm.   No murmur heard. DP pulses present bilateral.  Respiratory: Effort normal and breath sounds normal. No respiratory distress.  GI: Soft. She exhibits no mass. There is no hepatomegaly. There is no tenderness.  Musculoskeletal: She exhibits no edema.  Lymphadenopathy:    She has no cervical adenopathy.  Neurological: She is alert and oriented to person, place, and time. She has normal strength. Coordination normal.  Skin: Skin is warm. No erythema.  Psychiatric: She has a normal mood and affect.  Well groomed, good eye contact.      ASSESSMENT AND  PLAN:     Diagnoses and all orders for this visit:  Essential hypertension  Adequately controlled. No changes in current management. Low salt  diet recommended. Eye exam recommended annually. She is following with nephrologists q 3-4 months, I will see her back in 06/2015.  CKD (chronic kidney disease) stage 3, GFR 30-59 ml/min  Stable. She is currently following with nephrologists. Caution with diuretic. Avoid NSAID's and continue low salt diet and Diovan.  Hot flashes not due to menopause  Well controlled on current HT regimen. We clearly discussed side effects of  hormonal therapy, including breat cancer and CVD; she voices understanding. She tells me that she tried patch before and did not seen to help.      -Since she is following with nephrologists q 3-4 months I think I can se her annually after next appt in 06/2015, which is time for her routine annual visit (which she wants to keep). -She is on low dose of Amlodipine and Zocor, tolerating well, so no changes for now. May consider decreasing Zocor dose depending of FLP in 06/2015.       Christle Nolting G. Martinique, MD  [facility]. [place] office.

## 2016-02-13 NOTE — Progress Notes (Signed)
Pre visit review using our clinic review tool, if applicable. No additional management support is needed unless otherwise documented below in the visit note. 

## 2016-02-27 NOTE — Progress Notes (Signed)
Subjective:   [name] is a 82 y.o. female who presents for Medicare Annual/Subsequent preventive examination.   HRA assessment completed during this visit with Mrs. Stockham  The Patient was informed that the wellness visit is to identify future health risk and educate and initiate measures that can reduce risk for increased disease through the lifespan.    NO ROS; Medicare Wellness Visit Last OV 08/0/2017 with Dr. Martinique   PMH; Obesity; HTN; Hyperlipidemia (ratio 2016 3) ; ovarian cancer  Ruptured cyst that was cancerous [age] yo / did surgery and had some complications from chemo in abd  Hx small bowel obstruction  Hs of TKR x 82 yo  Psychosocial:  Spouse passed away x  3 yo; had prostate cancer fought this x 12 y 3 children; one in [place] one in [place]; one  [place] One grand child and one on the way. Tobacco: never smoked ETOH  No  BP 138/70 today;  Kidney doctor follows   Medications reviewed for issues; compliance; otc meds  BMI: 35;   Diet: Breakfast; has a banana and peanut butter for breakfast Lunch; none  Dinner around [employer] and pasta  Some vegetables  Food prior to taking evening meds; Chocolate almonds or chocolate walnuts Cheddar cheese or scoop of ice cream around 8   Exercise;   Work out with a Clinical research associate once a week At [employer]; The trainer helps her do recumbent bike. Weights and stretches;  Trainer shows her what to do at home  Friend encouraged her to go!  HOME SAFETY Long term plan reviewed  13 stairs in home; bedroom upstairs;  Bathrooms; takes a shower; can get in and out  Home: level; barriers; or needs identified as bathroom railing or other review; Has been getting up to go to the bathroom x 1 during the night and this is her first time.  Fall hx; yes   Fell x 82 yo; Friday after Thanksgiving; and tripped over change in side walk; no injury/ Activie   Given education on "Fall Prevention in the Home" for more safety tips the  patient can apply as appropriate.   Personal safety issues reviewed:  1.  for risk such as safe community/ it is safe 2.  smoke detector/ yes  3.  firearms safety if applicable  4. protection when in the sun; very rarely  5. driving safety for seniors or any recent accidents. No    Risk for Depression reviewed: Any emotional problems? Anxious, depressed, irritable, sad or blue? no Denies feeling depressed or hopeless; voices pleasure in daily life How many social activities have you been engaged in within the last 2 weeks? no  Cognitive; memory no issues  Manages checkbook, medications; no failures of task Ad8 score reviewed for issues;  Issues making decisions; no  Less interest in hobbies / activities" no  Repeats questions, stories; family complaining: NO  Trouble using ordinary gadgets; microwave; computer: no  Forgets the month or year: no  Mismanaging finances: no  Missing apt: no but does write them down  Daily problems with thinking of memory NO Ad8 score is 0  MMSE not appropriate unless AD8 score is > 2   Advanced Directive addressed; yes    Counseling Health Maintenance Gaps: Dexa/ completed at 71 per the patient and was normal Tdap/ due; educated  Flu/ High dose;   Colonoscopy; aged  out EKG: 07/2013 Mammogram: 09/2015- [facility]x [facility] left mammogram with u/s  Aspirated cyst; all is ok  Dexa/ states she had one around 89; was normal; stated she had strong bones  PAP: educated regarding the need for GYN exam;   Hearing: 4000hz  both ears   Ophthalmology exam eye exam in Dec; apt 06/2016  Dr. Kathrin Penner  Glaucoma screening Diabetic retinopathy if appropriate   Immunizations Due: (Vaccines reviewed and educated regarding any overdue)    Established and updated Risk reviewed and appropriate referral made or health recommendations:  Current Care Team reviewed and updated        Objective:    Vitals: BP 138/70   Pulse 82   Ht 5\' 2"  (1.575 m)   Wt  192 lb 7 oz (87.3 kg)   SpO2 99%   BMI 35.20 kg/m   Body mass index is 35.2 kg/m.  Tobacco History  Smoking Status  . Never Smoker  Smokeless Tobacco  . Never Used     Counseling given: Yes   Past Medical History:  Diagnosis Date  . Allergy   . Arthritis   . Broken foot    left  . Cancer ([place])    ovarian  . Hyperlipidemia   . Hypertension   . Obesity    Past Surgical History:  Procedure Laterality Date  . CATARACT EXTRACTION    . CHOLECYSTECTOMY    . TOTAL ABDOMINAL HYSTERECTOMY W/ BILATERAL SALPINGOOPHORECTOMY    . TOTAL KNEE ARTHROPLASTY     Family History  Problem Relation Age of Onset  . Diabetes      fhx  . Hypertension      fhx  . Obesity      fhx   History  Sexual Activity  . Sexual activity: Not on file    Outpatient Encounter Prescriptions as of 02/28/2016  Medication Sig  . acetaminophen (TYLENOL) 650 MG CR tablet Take 650 mg by mouth every 8 (eight) hours as needed for pain (pain).   Marland Kitchen allopurinol (ZYLOPRIM) 100 MG tablet Take 1 tablet (100 mg total) by mouth 2 (two) times daily.  Marland Kitchen amLODipine (NORVASC) 5 MG tablet Take 1 tablet (5 mg total) by mouth daily.  Marland Kitchen aspirin 81 MG EC tablet Take 81 mg by mouth daily.    . calcitRIOL (ROCALTROL) 0.25 MCG capsule Take 0.25 mcg by mouth daily.   . calcium carbonate (TUMS) 500 MG chewable tablet Chew 1 tablet by mouth daily.    . cetirizine (ZYRTEC) 10 MG tablet Take 10 mg by mouth daily.    Marland Kitchen estrogens, conjugated, (PREMARIN) 0.3 MG tablet One tablet twice per week  . furosemide (LASIX) 40 MG tablet Take 1 tablet (40 mg total) by mouth daily.  Marland Kitchen levothyroxine (SYNTHROID, LEVOTHROID) 50 MCG tablet take 1 tablet by mouth DAILY THEN 1/2 TABLET THE NEXT DAY ROTATING  . potassium chloride SA (K-DUR,KLOR-CON) 20 MEQ tablet Take 1 tablet (20 mEq total) by mouth daily.  . simvastatin (ZOCOR) 20 MG tablet Take 1 tablet (20 mg total) by mouth at bedtime.  . valsartan (DIOVAN) 80 MG tablet Take 1 tablet (80 mg  total) by mouth daily.   No facility-administered encounter medications on file as of 02/28/2016.     Activities of Daily Living No flowsheet data found.  Patient Care Team: Betty G Martinique, MD as PCP - General (Family Medicine)   Assessment:   Exercise Activities and Dietary recommendations    Goals    . patient          Between reading will move more;  The plan is to stretch, walk or  strength train every chapter.   Will exercise 20 minutes every day / can be intermittent        Fall Risk Fall Risk  06/24/2015 06/24/2015 05/21/2014  Falls in the past year? Yes No Yes  Number falls in past yr: 1 - -  Injury with Fall? No - -   Depression Screen PHQ 2/9 Scores 06/24/2015 06/24/2015 05/21/2014  PHQ - 2 Score 0 0 1    Cognitive Testing No flowsheet data found.  Ad8 score 0   Immunization History  Administered Date(s) Administered  . Influenza Split 03/15/2011, 03/17/2012, 04/17/2013  . Influenza Whole 07/06/2004, 04/05/2009, 02/27/2010  . Influenza, High Dose Seasonal PF 02/28/2016  . Influenza-Unspecified 03/06/2014, 03/07/2015  . Pneumococcal Conjugate-13 05/21/2014  . Pneumococcal Polysaccharide-23 07/06/2001, 01/19/2008  . Td 07/06/2005  . Zoster 01/19/2008   Screening Tests Health Maintenance  Topic Date Due  . TETANUS/TDAP  07/07/2015  . INFLUENZA VACCINE  02/04/2016  . DEXA SCAN  02/26/2017 (Originally 10/02/2000)  . ZOSTAVAX  Completed  . PNA vac Low Risk Adult  Completed      Plan:     Will take high does flu shot today  TDAP is due; but if you have a cut come to the office to get it  Postponed Dexa; bone density due to normal result before   Educated and discussed exercise    During the course of the visit the patient was educated and counseled about the following appropriate screening and preventive services:   Vaccines to include Pneumoccal, Influenza, Hepatitis B, Td, Zostavax, HCV  Electrocardiogram  Cardiovascular  Disease  Colorectal cancer screening  Diabetes screening  Prostate Cancer Screening  Glaucoma screening  Nutrition counseling   Smoking cessation counseling  Patient Instructions (the written plan) was given to the patient.    Wynetta Fines, RN  02/28/2016

## 2016-02-28 ENCOUNTER — Ambulatory Visit (INDEPENDENT_AMBULATORY_CARE_PROVIDER_SITE_OTHER): Payer: Medicare Other

## 2016-02-28 DIAGNOSIS — Z0000 Encounter for general adult medical examination without abnormal findings: Secondary | ICD-10-CM

## 2016-02-28 DIAGNOSIS — Z23 Encounter for immunization: Secondary | ICD-10-CM | POA: Diagnosis not present

## 2016-02-28 NOTE — Patient Instructions (Addendum)
[name] , Thank you for taking time to come for your Medicare Wellness Visit. I appreciate your ongoing commitment to your health goals. Please review the following plan we discussed and let me know if I can assist you in the future.   Will take high does flu shot today  TDAP is due; but if you have a cut come to the office to get it  Postponed Dexa; bone density due to normal result before   These are the goals we discussed: Goals    . patient          Between reading will move more;  The plan is to stretch, walk or strength train every chapter.   Will exercise 20 minutes every day / can be intermittent         This is a list of the screening recommended for you and due dates:  Health Maintenance  Topic Date Due  . Tetanus Vaccine  07/07/2015  . Flu Shot  02/04/2016  . DEXA scan (bone density measurement)  02/26/2017*  . Shingles Vaccine  Completed  . Pneumonia vaccines  Completed  *Topic was postponed. The date shown is not the original due date.     Fall Prevention in the Home  Falls can cause injuries. They can happen to people of all ages. There are many things you can do to make your home safe and to help prevent falls.  WHAT CAN I DO ON THE OUTSIDE OF MY HOME?  Regularly fix the edges of walkways and driveways and fix any cracks.  Remove anything that might make you trip as you walk through a door, such as a raised step or threshold.  Trim any bushes or trees on the path to your home.  Use bright outdoor lighting.  Clear any walking paths of anything that might make someone trip, such as rocks or tools.  Regularly check to see if handrails are loose or broken. Make sure that both sides of any steps have handrails.  Any raised decks and porches should have guardrails on the edges.  Have any leaves, snow, or ice cleared regularly.  Use sand or salt on walking paths during winter.  Clean up any spills in your garage right away. This includes oil or  grease spills. WHAT CAN I DO IN THE BATHROOM?   Use night lights.  Install grab bars by the toilet and in the tub and shower. Do not use towel bars as grab bars.  Use non-skid mats or decals in the tub or shower.  If you need to sit down in the shower, use a plastic, non-slip stool.  Keep the floor dry. Clean up any water that spills on the floor as soon as it happens.  Remove soap buildup in the tub or shower regularly.  Attach bath mats securely with double-sided non-slip rug tape.  Do not have throw rugs and other things on the floor that can make you trip. WHAT CAN I DO IN THE BEDROOM?  Use night lights.  Make sure that you have a light by your bed that is easy to reach.  Do not use any sheets or blankets that are too big for your bed. They should not hang down onto the floor.  Have a firm chair that has side arms. You can use this for support while you get dressed.  Do not have throw rugs and other things on the floor that can make you trip. WHAT CAN I DO IN THE KITCHEN?  Clean up any spills right away.  Avoid walking on wet floors.  Keep items that you use a lot in easy-to-reach places.  If you need to reach something above you, use a strong step stool that has a grab bar.  Keep electrical cords out of the way.  Do not use floor polish or wax that makes floors slippery. If you must use wax, use non-skid floor wax.  Do not have throw rugs and other things on the floor that can make you trip. WHAT CAN I DO WITH MY STAIRS?  Do not leave any items on the stairs.  Make sure that there are handrails on both sides of the stairs and use them. Fix handrails that are broken or loose. Make sure that handrails are as long as the stairways.  Check any carpeting to make sure that it is firmly attached to the stairs. Fix any carpet that is loose or worn.  Avoid having throw rugs at the top or bottom of the stairs. If you do have throw rugs, attach them to the floor with  carpet tape.  Make sure that you have a light switch at the top of the stairs and the bottom of the stairs. If you do not have them, ask someone to add them for you. WHAT ELSE CAN I DO TO HELP PREVENT FALLS?  Wear shoes that:  Do not have high heels.  Have rubber bottoms.  Are comfortable and fit you well.  Are closed at the toe. Do not wear sandals.  If you use a stepladder:  Make sure that it is fully opened. Do not climb a closed stepladder.  Make sure that both sides of the stepladder are locked into place.  Ask someone to hold it for you, if possible.  Clearly mark and make sure that you can see:  Any grab bars or handrails.  First and last steps.  Where the edge of each step is.  Use tools that help you move around (mobility aids) if they are needed. These include:  Canes.  Walkers.  Scooters.  Crutches.  Turn on the lights when you go into a dark area. Replace any light bulbs as soon as they burn out.  Set up your furniture so you have a clear path. Avoid moving your furniture around.  If any of your floors are uneven, fix them.  If there are any pets around you, be aware of where they are.  Review your medicines with your doctor. Some medicines can make you feel dizzy. This can increase your chance of falling. Ask your doctor what other things that you can do to help prevent falls.   This information is not intended to replace advice given to you by your health care provider. Make sure you discuss any questions you have with your health care provider.   Document Released: 04/18/2009 Document Revised: 11/06/2014 Document Reviewed: 07/27/2014 Elsevier Interactive Patient Education 2016 [place] Maintenance, Female Adopting a healthy lifestyle and getting preventive care can go a long way to promote health and wellness. Talk with your health care provider about what schedule of regular examinations is right for you. This is a good chance  for you to check in with your provider about disease prevention and staying healthy. In between checkups, there are plenty of things you can do on your own. Experts have done a lot of research about which lifestyle changes and preventive measures are most likely to keep you healthy. Ask your health  care provider for more information. WEIGHT AND DIET  Eat a healthy diet  Be sure to include plenty of vegetables, fruits, low-fat dairy products, and lean protein.  Do not eat a lot of foods high in solid fats, added sugars, or salt.  Get regular exercise. This is one of the most important things you can do for your health.  Most adults should exercise for at least 150 minutes each week. The exercise should increase your heart rate and make you sweat (moderate-intensity exercise).  Most adults should also do strengthening exercises at least twice a week. This is in addition to the moderate-intensity exercise.  Maintain a healthy weight  Body mass index (BMI) is a measurement that can be used to identify possible weight problems. It estimates body fat based on height and weight. Your health care provider can help determine your BMI and help you achieve or maintain a healthy weight.  For females 75 years of age and older:   A BMI below 18.5 is considered underweight.  A BMI of 18.5 to 24.9 is normal.  A BMI of 25 to 29.9 is considered overweight.  A BMI of 30 and above is considered obese.  Watch levels of cholesterol and blood lipids  You should start having your blood tested for lipids and cholesterol at 80 years of age, then have this test every 5 years.  You may need to have your cholesterol levels checked more often if:  Your lipid or cholesterol levels are high.  You are older than 80 years of age.  You are at high risk for heart disease.  CANCER SCREENING   Lung Cancer  Lung cancer screening is recommended for adults 3-36 years old who are at high risk for lung cancer  because of a history of smoking.  A yearly low-dose CT scan of the lungs is recommended for people who:  Currently smoke.  Have quit within the past 15 years.  Have at least a 30-pack-year history of smoking. A pack year is smoking an average of one pack of cigarettes a day for 1 year.  Yearly screening should continue until it has been 15 years since you quit.  Yearly screening should stop if you develop a health problem that would prevent you from having lung cancer treatment.  Breast Cancer  Practice breast self-awareness. This means understanding how your breasts normally appear and feel.  It also means doing regular breast self-exams. Let your health care provider know about any changes, no matter how small.  If you are in your 20s or 30s, you should have a clinical breast exam (CBE) by a health care provider every 1-3 years as part of a regular health exam.  If you are 61 or older, have a CBE every year. Also consider having a breast X-ray (mammogram) every year.  If you have a family history of breast cancer, talk to your health care provider about genetic screening.  If you are at high risk for breast cancer, talk to your health care provider about having an MRI and a mammogram every year.  Breast cancer gene (BRCA) assessment is recommended for women who have family members with BRCA-related cancers. BRCA-related cancers include:  Breast.  Ovarian.  Tubal.  Peritoneal cancers.  Results of the assessment will determine the need for genetic counseling and BRCA1 and BRCA2 testing. Cervical Cancer Your health care provider may recommend that you be screened regularly for cancer of the pelvic organs (ovaries, uterus, and vagina). This screening involves  a pelvic examination, including checking for microscopic changes to the surface of your cervix (Pap test). You may be encouraged to have this screening done every 3 years, beginning at age 71.  For women ages 63-65,  health care providers may recommend pelvic exams and Pap testing every 3 years, or they may recommend the Pap and pelvic exam, combined with testing for human papilloma virus (HPV), every 5 years. Some types of HPV increase your risk of cervical cancer. Testing for HPV may also be done on women of any age with unclear Pap test results.  Other health care providers may not recommend any screening for nonpregnant women who are considered low risk for pelvic cancer and who do not have symptoms. Ask your health care provider if a screening pelvic exam is right for you.  If you have had past treatment for cervical cancer or a condition that could lead to cancer, you need Pap tests and screening for cancer for at least 20 years after your treatment. If Pap tests have been discontinued, your risk factors (such as having a new sexual partner) need to be reassessed to determine if screening should resume. Some women have medical problems that increase the chance of getting cervical cancer. In these cases, your health care provider may recommend more frequent screening and Pap tests. Colorectal Cancer  This type of cancer can be detected and often prevented.  Routine colorectal cancer screening usually begins at 80 years of age and continues through 80 years of age.  Your health care provider may recommend screening at an earlier age if you have risk factors for colon cancer.  Your health care provider may also recommend using home test kits to check for hidden blood in the stool.  A small camera at the end of a tube can be used to examine your colon directly (sigmoidoscopy or colonoscopy). This is done to check for the earliest forms of colorectal cancer.  Routine screening usually begins at age 48.  Direct examination of the colon should be repeated every 5-10 years through 80 years of age. However, you may need to be screened more often if early forms of precancerous polyps or small growths are  found. Skin Cancer  Check your skin from head to toe regularly.  Tell your health care provider about any new moles or changes in moles, especially if there is a change in a mole's shape or color.  Also tell your health care provider if you have a mole that is larger than the size of a pencil eraser.  Always use sunscreen. Apply sunscreen liberally and repeatedly throughout the day.  Protect yourself by wearing long sleeves, pants, a wide-brimmed hat, and sunglasses whenever you are outside. HEART DISEASE, DIABETES, AND HIGH BLOOD PRESSURE   High blood pressure causes heart disease and increases the risk of stroke. High blood pressure is more likely to develop in:  People who have blood pressure in the high end of the normal range (130-139/85-89 mm Hg).  People who are overweight or obese.  People who are African American.  If you are 58-66 years of age, have your blood pressure checked every 3-5 years. If you are 75 years of age or older, have your blood pressure checked every year. You should have your blood pressure measured twice--once when you are at a hospital or clinic, and once when you are not at a hospital or clinic. Record the average of the two measurements. To check your blood pressure when you are  not at a hospital or clinic, you can use:  An automated blood pressure machine at a pharmacy.  A home blood pressure monitor.  If you are between 87 years and 107 years old, ask your health care provider if you should take aspirin to prevent strokes.  Have regular diabetes screenings. This involves taking a blood sample to check your fasting blood sugar level.  If you are at a normal weight and have a low risk for diabetes, have this test once every three years after 80 years of age.  If you are overweight and have a high risk for diabetes, consider being tested at a younger age or more often. PREVENTING INFECTION  Hepatitis B  If you have a higher risk for hepatitis B,  you should be screened for this virus. You are considered at high risk for hepatitis B if:  You were born in a country where hepatitis B is common. Ask your health care provider which countries are considered high risk.  Your parents were born in a high-risk country, and you have not been immunized against hepatitis B (hepatitis B vaccine).  You have HIV or AIDS.  You use needles to inject street drugs.  You live with someone who has hepatitis B.  You have had sex with someone who has hepatitis B.  You get hemodialysis treatment.  You take certain medicines for conditions, including cancer, organ transplantation, and autoimmune conditions. Hepatitis C  Blood testing is recommended for:  Everyone born from [birth year] through 1963.  Anyone with known risk factors for hepatitis C. Sexually transmitted infections (STIs)  You should be screened for sexually transmitted infections (STIs) including gonorrhea and chlamydia if:  You are sexually active and are younger than 80 years of age.  You are older than 80 years of age and your health care provider tells you that you are at risk for this type of infection.  Your sexual activity has changed since you were last screened and you are at an increased risk for chlamydia or gonorrhea. Ask your health care provider if you are at risk.  If you do not have HIV, but are at risk, it may be recommended that you take a prescription medicine daily to prevent HIV infection. This is called pre-exposure prophylaxis (PrEP). You are considered at risk if:  You are sexually active and do not regularly use condoms or know the HIV status of your partner(s).  You take drugs by injection.  You are sexually active with a partner who has HIV. Talk with your health care provider about whether you are at high risk of being infected with HIV. If you choose to begin PrEP, you should first be tested for HIV. You should then be tested every 3 months for as long as  you are taking PrEP.  PREGNANCY   If you are premenopausal and you may become pregnant, ask your health care provider about preconception counseling.  If you may become pregnant, take 400 to 800 micrograms (mcg) of folic acid every day.  If you want to prevent pregnancy, talk to your health care provider about birth control (contraception). OSTEOPOROSIS AND MENOPAUSE   Osteoporosis is a disease in which the bones lose minerals and strength with aging. This can result in serious bone fractures. Your risk for osteoporosis can be identified using a bone density scan.  If you are 64 years of age or older, or if you are at risk for osteoporosis and fractures, ask your health care provider if  you should be screened.  Ask your health care provider whether you should take a calcium or vitamin D supplement to lower your risk for osteoporosis.  Menopause may have certain physical symptoms and risks.  Hormone replacement therapy may reduce some of these symptoms and risks. Talk to your health care provider about whether hormone replacement therapy is right for you.  HOME CARE INSTRUCTIONS   Schedule regular health, dental, and eye exams.  Stay current with your immunizations.   Do not use any tobacco products including cigarettes, chewing tobacco, or electronic cigarettes.  If you are pregnant, do not drink alcohol.  If you are breastfeeding, limit how much and how often you drink alcohol.  Limit alcohol intake to no more than 1 drink per day for nonpregnant women. One drink equals 12 ounces of beer, 5 ounces of wine, or 1 ounces of hard liquor.  Do not use street drugs.  Do not share needles.  Ask your health care provider for help if you need support or information about quitting drugs.  Tell your health care provider if you often feel depressed.  Tell your health care provider if you have ever been abused or do not feel safe at home.   This information is not intended to replace  advice given to you by your health care provider. Make sure you discuss any questions you have with your health care provider.   Document Released: 01/05/2011 Document Revised: 07/13/2014 Document Reviewed: 05/24/2013 Elsevier Interactive Patient Education [employer].

## 2016-03-02 NOTE — Progress Notes (Signed)
I have reviewed documentation from this visit and I agree with recommendations given.  Betty G. Jordan, MD   [facility].   

## 2016-03-18 DIAGNOSIS — Z8543 Personal history of malignant neoplasm of ovary: Secondary | ICD-10-CM | POA: Diagnosis not present

## 2016-03-18 DIAGNOSIS — D631 Anemia in chronic kidney disease: Secondary | ICD-10-CM | POA: Diagnosis not present

## 2016-03-18 DIAGNOSIS — N184 Chronic kidney disease, stage 4 (severe): Secondary | ICD-10-CM | POA: Diagnosis not present

## 2016-03-18 DIAGNOSIS — N390 Urinary tract infection, site not specified: Secondary | ICD-10-CM | POA: Diagnosis not present

## 2016-03-18 DIAGNOSIS — E782 Mixed hyperlipidemia: Secondary | ICD-10-CM | POA: Diagnosis not present

## 2016-03-18 DIAGNOSIS — R809 Proteinuria, unspecified: Secondary | ICD-10-CM | POA: Diagnosis not present

## 2016-03-18 DIAGNOSIS — N2581 Secondary hyperparathyroidism of renal origin: Secondary | ICD-10-CM | POA: Diagnosis not present

## 2016-03-18 DIAGNOSIS — I129 Hypertensive chronic kidney disease with stage 1 through stage 4 chronic kidney disease, or unspecified chronic kidney disease: Secondary | ICD-10-CM | POA: Diagnosis not present

## 2016-03-18 DIAGNOSIS — Z87440 Personal history of urinary (tract) infections: Secondary | ICD-10-CM | POA: Diagnosis not present

## 2016-03-18 DIAGNOSIS — M109 Gout, unspecified: Secondary | ICD-10-CM | POA: Diagnosis not present

## 2016-03-18 DIAGNOSIS — E038 Other specified hypothyroidism: Secondary | ICD-10-CM | POA: Diagnosis not present

## 2016-03-18 DIAGNOSIS — M1990 Unspecified osteoarthritis, unspecified site: Secondary | ICD-10-CM | POA: Diagnosis not present

## 2016-03-18 LAB — CBC AND DIFFERENTIAL
HCT: 39 % (ref 36–46)
Hemoglobin: 13.6 g/dL (ref 12.0–16.0)
Platelets: 235 10*3/uL (ref 150–399)
WBC: 8.8 10^3/mL

## 2016-04-21 DIAGNOSIS — Z87440 Personal history of urinary (tract) infections: Secondary | ICD-10-CM | POA: Diagnosis not present

## 2016-04-29 DIAGNOSIS — R809 Proteinuria, unspecified: Secondary | ICD-10-CM | POA: Diagnosis not present

## 2016-05-08 DIAGNOSIS — N184 Chronic kidney disease, stage 4 (severe): Secondary | ICD-10-CM | POA: Diagnosis not present

## 2016-06-11 ENCOUNTER — Other Ambulatory Visit: Payer: Self-pay

## 2016-06-12 DIAGNOSIS — H04123 Dry eye syndrome of bilateral lacrimal glands: Secondary | ICD-10-CM | POA: Diagnosis not present

## 2016-06-12 DIAGNOSIS — Z961 Presence of intraocular lens: Secondary | ICD-10-CM | POA: Diagnosis not present

## 2016-06-12 DIAGNOSIS — E119 Type 2 diabetes mellitus without complications: Secondary | ICD-10-CM | POA: Diagnosis not present

## 2016-06-12 DIAGNOSIS — H5213 Myopia, bilateral: Secondary | ICD-10-CM | POA: Diagnosis not present

## 2016-06-18 ENCOUNTER — Other Ambulatory Visit (INDEPENDENT_AMBULATORY_CARE_PROVIDER_SITE_OTHER): Payer: Medicare Other

## 2016-06-18 DIAGNOSIS — E039 Hypothyroidism, unspecified: Secondary | ICD-10-CM | POA: Diagnosis not present

## 2016-06-18 DIAGNOSIS — E785 Hyperlipidemia, unspecified: Secondary | ICD-10-CM | POA: Diagnosis not present

## 2016-06-18 DIAGNOSIS — I10 Essential (primary) hypertension: Secondary | ICD-10-CM | POA: Diagnosis not present

## 2016-06-18 DIAGNOSIS — R300 Dysuria: Secondary | ICD-10-CM

## 2016-06-18 LAB — POC URINALSYSI DIPSTICK (AUTOMATED)
BILIRUBIN UA: NEGATIVE
GLUCOSE UA: NEGATIVE
Ketones, UA: NEGATIVE
NITRITE UA: NEGATIVE
Spec Grav, UA: 1.02
Urobilinogen, UA: 0.2
pH, UA: 5.5

## 2016-06-18 LAB — LIPID PANEL
CHOLESTEROL: 186 mg/dL (ref 0–200)
HDL: 75.7 mg/dL (ref >39.00)
LDL Cholesterol: 76 mg/dL (ref 0–99)
NonHDL: 110.76
TRIGLYCERIDES: 175 mg/dL — AB (ref 0.0–149.0)
Total CHOL/HDL Ratio: 2
VLDL: 35 mg/dL (ref 0.0–40.0)

## 2016-06-18 LAB — HEPATIC FUNCTION PANEL
ALT: 14 U/L (ref 0–35)
AST: 18 U/L (ref 0–37)
Albumin: 4.6 g/dL (ref 3.5–5.2)
Alkaline Phosphatase: 76 U/L (ref 39–117)
Bilirubin, Direct: 0.1 mg/dL (ref 0.0–0.3)
TOTAL PROTEIN: 6.8 g/dL (ref 6.0–8.3)
Total Bilirubin: 0.4 mg/dL (ref 0.2–1.2)

## 2016-06-18 LAB — URINALYSIS, MICROSCOPIC ONLY

## 2016-06-18 LAB — BASIC METABOLIC PANEL
BUN: 32 mg/dL — AB (ref 6–23)
CHLORIDE: 103 meq/L (ref 96–112)
CO2: 27 mEq/L (ref 19–32)
Calcium: 9.9 mg/dL (ref 8.4–10.5)
Creatinine, Ser: 1.46 mg/dL — ABNORMAL HIGH (ref 0.40–1.20)
GFR: 36.57 mL/min — ABNORMAL LOW (ref >60.00)
Glucose, Bld: 120 mg/dL — ABNORMAL HIGH (ref 70–99)
POTASSIUM: 4 meq/L (ref 3.5–5.1)
Sodium: 139 mEq/L (ref 135–145)

## 2016-06-18 LAB — TSH: TSH: 3.35 u[IU]/mL (ref 0.35–4.50)

## 2016-06-19 LAB — CBC WITH DIFFERENTIAL/PLATELET
BASOS PCT: 0.8 % (ref 0.0–3.0)
Basophils Absolute: 0.1 10*3/uL (ref 0.0–0.1)
Eosinophils Absolute: 0.6 10*3/uL (ref 0.0–0.7)
Eosinophils Relative: 7 % — ABNORMAL HIGH (ref 0.0–5.0)
HEMATOCRIT: 38.8 % (ref 36.0–46.0)
HEMOGLOBIN: 12.9 g/dL (ref 12.0–15.0)
LYMPHS PCT: 31.8 % (ref 12.0–46.0)
Lymphs Abs: 2.9 10*3/uL (ref 0.7–4.0)
MCHC: 33.4 g/dL (ref 30.0–36.0)
MCV: 91.3 fl (ref 78.0–100.0)
MONOS PCT: 7.3 % (ref 3.0–12.0)
Monocytes Absolute: 0.7 10*3/uL (ref 0.1–1.0)
Neutro Abs: 4.9 10*3/uL (ref 1.4–7.7)
Neutrophils Relative %: 53.1 % (ref 43.0–77.0)
Platelets: 262 10*3/uL (ref 150.0–400.0)
RBC: 4.25 Mil/uL (ref 3.87–5.11)
RDW: 15 % (ref 11.5–15.5)
WBC: 9.2 10*3/uL (ref 4.0–10.5)

## 2016-06-20 LAB — URINE CULTURE

## 2016-06-21 ENCOUNTER — Other Ambulatory Visit: Payer: Self-pay | Admitting: Family Medicine

## 2016-06-25 ENCOUNTER — Ambulatory Visit (INDEPENDENT_AMBULATORY_CARE_PROVIDER_SITE_OTHER): Payer: Medicare Other | Admitting: Family Medicine

## 2016-06-25 ENCOUNTER — Encounter: Payer: Self-pay | Admitting: Family Medicine

## 2016-06-25 VITALS — BP 130/70 | HR 97 | Resp 12 | Ht 62.0 in | Wt 193.1 lb

## 2016-06-25 DIAGNOSIS — N183 Chronic kidney disease, stage 3 unspecified: Secondary | ICD-10-CM

## 2016-06-25 DIAGNOSIS — I10 Essential (primary) hypertension: Secondary | ICD-10-CM | POA: Diagnosis not present

## 2016-06-25 DIAGNOSIS — R319 Hematuria, unspecified: Secondary | ICD-10-CM

## 2016-06-25 DIAGNOSIS — E039 Hypothyroidism, unspecified: Secondary | ICD-10-CM

## 2016-06-25 DIAGNOSIS — M1990 Unspecified osteoarthritis, unspecified site: Secondary | ICD-10-CM | POA: Diagnosis not present

## 2016-06-25 DIAGNOSIS — N390 Urinary tract infection, site not specified: Secondary | ICD-10-CM

## 2016-06-25 DIAGNOSIS — E785 Hyperlipidemia, unspecified: Secondary | ICD-10-CM

## 2016-06-25 DIAGNOSIS — R232 Flushing: Secondary | ICD-10-CM

## 2016-06-25 MED ORDER — SULFAMETHOXAZOLE-TRIMETHOPRIM 800-160 MG PO TABS: 1.0000 | ORAL_TABLET | Freq: Two times a day (BID) | ORAL | 0 refills | Status: AC

## 2016-06-25 NOTE — Patient Instructions (Addendum)
A few things to remember from today's visit:   Hypothyroidism, unspecified type  Essential hypertension  Hot flashes not due to menopause  Hyperlipidemia, unspecified hyperlipidemia type  CKD (chronic kidney disease) stage 3, GFR 30-59 ml/min  Urinary tract infection with hematuria, site unspecified - Plan: sulfamethoxazole-trimethoprim (BACTRIM DS,SEPTRA DS) 800-160 MG tablet  No changes today. Keep appt with kidney doctor. Lab here in 3 months to check urine and antibiotic sent to pharmacy for possibe urine infection.    Please be sure medication list is accurate. If a new problem present, please set up appointment sooner than planned today.

## 2016-06-25 NOTE — Progress Notes (Signed)
HPI:   [name] is a 82 y.o. female, who is here today to follow on some of her chronic medical problems.  She thought this visit today was a CPE, she had her Medicare preventive visit with Ms [name] 02/2016.  She also had labs done recently as part of her "CPE"   Hypertension:    Currently on Diovan 80 mg daily and Amlodipine 5 mg daily. She is on Furosemide 40 mg daily, according to pt, started for HTN. Hx of CKD III and gout. She follows with nephrologists, next appt in 09/2016.  HypoK+ on KCL. She has not noted unusual headache, visual changes, exertional chest pain, dyspnea,  focal weakness, or worsening LE edema.   Lab Results  Component Value Date   CREATININE 1.46 (H) 06/18/2016   BUN 32 (H) 06/18/2016   NA 139 06/18/2016   K 4.0 06/18/2016   CL 103 06/18/2016   CO2 27 06/18/2016    Takes Aspirin and concerned about stoppping it. She tells me that recently she heard about increased risk of CVA in pt's who stop Aspirin after been on it for years. She has atrial fib in her problem list, she denies any Hx, not aware of ever having atrial fib.   Hyperlipidemia:  Currently on Zocor 20 mg daily. Following a low fat diet: Yes. Exercises regularly, once per week she meets with a personal trainer.  She has not noted side effects with medication.  Lab Results  Component Value Date   CHOL 186 06/18/2016   HDL 75.70 06/18/2016   LDLCALC 76 06/18/2016   LDLDIRECT 91.4 02/03/2010   TRIG 175.0 (H) 06/18/2016   CHOLHDL 2 06/18/2016    Gout and OA:  She is on Allopurinol 100 mg 2 tabs daily. She has not had a gout flare up in a while. Hx of OA, she takes Tylenol 650 mg tid as needed.    Urinary symptoms:  Urinalysis done recently as part of labs before this visit, it was abnormal, blood present. She states that for the past few weeks she has had cloudy urine and mild intermittent dysuria.  She denies gross hematuria.   On hormonal  therapy, Premarin 0.3 mg 2 times per week, which she has taken for 10 years, still having hot flashes. Mammogram 09/2015 Birads 4, Bx done and negative for malignancy.  Hypothyroidism, she is on Levothyroxine 50 mcg daily.   Lab Results  Component Value Date   TSH 3.35 06/18/2016     Review of Systems  Constitutional: Negative for appetite change, fatigue, fever and unexpected weight change.  HENT: Negative for mouth sores, nosebleeds and trouble swallowing.   Eyes: Negative for redness and visual disturbance.  Respiratory: Negative for cough, shortness of breath and wheezing.   Cardiovascular: Negative for chest pain, palpitations and leg swelling.  Gastrointestinal: Negative for abdominal pain, nausea and vomiting.       Negative for changes in bowel habits.  Genitourinary: Positive for dysuria (intermitently). Negative for decreased urine volume, difficulty urinating and hematuria.  Musculoskeletal: Positive for arthralgias. Negative for gait problem.  Skin: Negative for rash.  Neurological: Negative for syncope, weakness and headaches.  Psychiatric/Behavioral: Negative for confusion. The patient is not nervous/anxious.       Current Outpatient Prescriptions on File Prior to Visit  Medication Sig Dispense Refill  . acetaminophen (TYLENOL) 650 MG CR tablet Take 650 mg by mouth every 8 (eight) hours as needed for pain (pain).     Marland Kitchen  allopurinol (ZYLOPRIM) 100 MG tablet Take 1 tablet (100 mg total) by mouth 2 (two) times daily. 200 tablet 3  . aspirin 81 MG EC tablet Take 81 mg by mouth daily.      . calcitRIOL (ROCALTROL) 0.25 MCG capsule Take 0.25 mcg by mouth every other day.     . calcium carbonate (TUMS) 500 MG chewable tablet Chew 1 tablet by mouth daily.      . cetirizine (ZYRTEC) 10 MG tablet Take 10 mg by mouth daily.      . furosemide (LASIX) 40 MG tablet take 1 tablet by mouth once daily 100 tablet 3  . levothyroxine (SYNTHROID, LEVOTHROID) 50 MCG tablet take 1 tablet  by mouth DAILY THEN 1/2 TABLET THE NEXT DAY ROTATING 150 tablet 3  . potassium chloride SA (K-DUR,KLOR-CON) 20 MEQ tablet Take 1 tablet (20 mEq total) by mouth daily. 100 tablet 3   No current facility-administered medications on file prior to visit.      Past Medical History:  Diagnosis Date  . Allergy   . Arthritis   . Broken foot    left  . Cancer ([place])    ovarian  . CKD (chronic kidney disease), stage III   . Hyperlipidemia   . Hypertension   . Obesity   . Thyroid disease    Allergies  Allergen Reactions  . Shellfish Allergy     Social History   Social History  . Marital status: Married    Spouse name: N/A  . Number of children: N/A  . Years of education: N/A   Social History Main Topics  . Smoking status: Never Smoker  . Smokeless tobacco: Never Used  . Alcohol use No  . Drug use: No  . Sexual activity: No   Other Topics Concern  . None   Social History Narrative  . None    Vitals:   06/25/16 1354  BP: 130/70  Pulse: 97  Resp: 12   Body mass index is 35.32 kg/m.    Physical Exam  Nursing note and vitals reviewed. Constitutional: She is oriented to person, place, and time. She appears well-developed. No distress.  HENT:  Head: Atraumatic.  Mouth/Throat: Oropharynx is clear and moist and mucous membranes are normal.  Eyes: Conjunctivae and EOM are normal. Pupils are equal, round, and reactive to light.  Cardiovascular: Normal rate and regular rhythm.   No murmur heard. DP pulses present bilateral.  Respiratory: Effort normal and breath sounds normal. No respiratory distress.  GI: Soft. She exhibits no mass. There is no hepatomegaly. There is no tenderness. There is no CVA tenderness.  Musculoskeletal: She exhibits edema (mild periankle edema bilateral, not pitting.).  Lymphadenopathy:    She has no cervical adenopathy.  Neurological: She is alert and oriented to person, place, and time. She has normal strength. No cranial nerve deficit.  Coordination normal.  Stable gait without assistance.  Skin: Skin is warm. No erythema.  Psychiatric: She has a normal mood and affect. Cognition and memory are normal.  Well groomed, good eye contact.      ASSESSMENT AND PLAN:     [name] was seen today for annual exam.  Diagnoses and all orders for this visit:    Osteoarthritis, unspecified osteoarthritis type, unspecified site  Fall precautions discussed. Continue regular,low impact exercise. No changes in OTC Tylenol.  Hypothyroidism, unspecified type  Stable. No changes in current management. F/U in 12 months.  Essential hypertension  Adequately controlled. No changes in current management. DASH-low salt  diet recommended. Eye exam recommended annually. F/U in 6 months, before if needed.  -     valsartan (DIOVAN) 80 MG tablet; Take 1 tablet (80 mg total) by mouth daily. -     amLODipine (NORVASC) 5 MG tablet; Take 1 tablet (5 mg total) by mouth daily.  Hot flashes not due to menopause  Adverse effects of hormonal therapy discussed. Still symptomatic. Recommend discontinuing Premarin. We could consider Effexor at bedtime if necessary.  Hyperlipidemia, unspecified hyperlipidemia type  Improved, TG mildly elevated. No changes in current management, we dicussed risk of interaction between Simvastatin and Amlodipine but since she has been tolerating well and taking low doses no changes today. F/U in 12 months.  -     simvastatin (ZOCOR) 20 MG tablet; Take 1 tablet (20 mg total) by mouth at bedtime.  CKD (chronic kidney disease) stage 3, GFR 30-59 ml/min  Stable. Low salt diet, avoidance of oral NSAID's, and continue Diovan recommended. Keep appt with nephrologists.  -     valsartan (DIOVAN) 80 MG tablet; Take 1 tablet (80 mg total) by mouth daily.  Urinary tract infection with hematuria, site unspecified  Treatment based on Ucx susceptibility report was started, Bactrim. Because hematuria,  recommended repeating urinalysis , which can be done here or in 09/2016 at her nephrologists' office. Instructed about warning signs.  -     sulfamethoxazole-trimethoprim (BACTRIM DS,SEPTRA DS) 800-160 MG tablet; Take 1 tablet by mouth 2 (two) times daily.      -[name] was advised to return sooner than planned today if new concerns arise.       [name] G. Martinique, MD  [facility]. [place] office.

## 2016-06-25 NOTE — Progress Notes (Signed)
Pre visit review using our clinic review tool, if applicable. No additional management support is needed unless otherwise documented below in the visit note. 

## 2016-06-29 ENCOUNTER — Encounter: Payer: Self-pay | Admitting: Family Medicine

## 2016-06-29 MED ORDER — AMLODIPINE BESYLATE 5 MG PO TABS: 5.0000 mg | ORAL_TABLET | Freq: Every day | ORAL | 2 refills | Status: DC

## 2016-06-29 MED ORDER — VALSARTAN 80 MG PO TABS: 80.0000 mg | ORAL_TABLET | Freq: Every day | ORAL | 2 refills | Status: DC

## 2016-06-29 MED ORDER — SIMVASTATIN 20 MG PO TABS: 20.0000 mg | ORAL_TABLET | Freq: Every day | ORAL | 3 refills | Status: DC

## 2016-07-14 ENCOUNTER — Other Ambulatory Visit: Payer: Self-pay | Admitting: Family Medicine

## 2016-09-03 ENCOUNTER — Other Ambulatory Visit: Payer: Self-pay | Admitting: Family Medicine

## 2016-09-03 DIAGNOSIS — N2581 Secondary hyperparathyroidism of renal origin: Secondary | ICD-10-CM | POA: Diagnosis not present

## 2016-09-03 DIAGNOSIS — N184 Chronic kidney disease, stage 4 (severe): Secondary | ICD-10-CM | POA: Diagnosis not present

## 2016-09-03 DIAGNOSIS — D631 Anemia in chronic kidney disease: Secondary | ICD-10-CM | POA: Diagnosis not present

## 2016-09-03 DIAGNOSIS — Z8543 Personal history of malignant neoplasm of ovary: Secondary | ICD-10-CM | POA: Diagnosis not present

## 2016-09-03 DIAGNOSIS — M109 Gout, unspecified: Secondary | ICD-10-CM | POA: Diagnosis not present

## 2016-09-03 DIAGNOSIS — I129 Hypertensive chronic kidney disease with stage 1 through stage 4 chronic kidney disease, or unspecified chronic kidney disease: Secondary | ICD-10-CM | POA: Diagnosis not present

## 2016-09-03 DIAGNOSIS — E782 Mixed hyperlipidemia: Secondary | ICD-10-CM | POA: Diagnosis not present

## 2016-09-03 DIAGNOSIS — Z87440 Personal history of urinary (tract) infections: Secondary | ICD-10-CM | POA: Diagnosis not present

## 2016-09-03 DIAGNOSIS — E038 Other specified hypothyroidism: Secondary | ICD-10-CM | POA: Diagnosis not present

## 2016-09-03 DIAGNOSIS — R809 Proteinuria, unspecified: Secondary | ICD-10-CM | POA: Diagnosis not present

## 2016-09-03 DIAGNOSIS — M1990 Unspecified osteoarthritis, unspecified site: Secondary | ICD-10-CM | POA: Diagnosis not present

## 2016-09-04 ENCOUNTER — Other Ambulatory Visit: Payer: Self-pay | Admitting: Family Medicine

## 2016-09-04 NOTE — Telephone Encounter (Signed)
Pt need new Rx for allopurinol   Pharm:  [facility] (at [facility] [employer])

## 2016-09-07 ENCOUNTER — Other Ambulatory Visit: Payer: Self-pay | Admitting: Family Medicine

## 2016-09-07 DIAGNOSIS — Z1231 Encounter for screening mammogram for malignant neoplasm of breast: Secondary | ICD-10-CM

## 2016-09-09 NOTE — Progress Notes (Signed)
HPI:   [name] is a 83 y.o. female, who is here today to follow on some chronic medical problems.  Hx of HTN,CKD III, thyroid disease, and OA.   Last seen on 02/13/16. She follows with nephrologists every 3-4 months.  She last followed with nephrologists last week and now she will be following every 6 months.  Today she is requesting handicap sticker renewal.  Hx of lower back pain and knee OA. Pain is exacerbated by prolonged walking and standing. Pain can be severe sometimes with certain activities,usually she stops and rest until pain is better then resumes activities. Back pain is not radiated but states that sometimes when walking long distances her legs "do not work",she cannot lift foot to continue walking.She denies associated leg pain,weakness, or cold extremity.This happens intermittently for years,stable, right or left, no numbness or tingling. No saddle anesthesia or changes in urine/bowel continence.  Right knee worse than left,s/p TKR. Pain is alleviated by rest.  She uses a cane as needed. Denies falls.  Takes Tylenol 650 mg tid as needed.    Review of Systems  Constitutional: Negative for appetite change, fatigue, fever and unexpected weight change.  Respiratory: Negative for cough, shortness of breath and wheezing.   Cardiovascular: Negative for chest pain, palpitations and leg swelling.  Gastrointestinal: Negative for abdominal pain, nausea and vomiting.       Negative for changes in bowel habits.  Musculoskeletal: Positive for arthralgias, back pain and gait problem.  Skin: Negative for rash.  Neurological: Negative for syncope, weakness and headaches.  Psychiatric/Behavioral: Negative for confusion. The patient is not nervous/anxious.       Current Outpatient Prescriptions on File Prior to Visit  Medication Sig Dispense Refill  . acetaminophen (TYLENOL) 650 MG CR tablet Take 650 mg by mouth every 8 (eight) hours as needed for pain  (pain).     Marland Kitchen allopurinol (ZYLOPRIM) 100 MG tablet take 1 tablet by mouth twice a day 200 tablet 1  . amLODipine (NORVASC) 5 MG tablet Take 1 tablet (5 mg total) by mouth daily. 90 tablet 2  . aspirin 81 MG EC tablet Take 81 mg by mouth daily.      . calcitRIOL (ROCALTROL) 0.25 MCG capsule Take 0.25 mcg by mouth every other day.     . calcium carbonate (TUMS) 500 MG chewable tablet Chew 1 tablet by mouth daily.      . cetirizine (ZYRTEC) 10 MG tablet Take 10 mg by mouth daily.      . furosemide (LASIX) 40 MG tablet take 1 tablet by mouth once daily 100 tablet 3  . levothyroxine (SYNTHROID, LEVOTHROID) 50 MCG tablet take 1 tablet by mouth DAILY THEN 1/2 TABLET THE NEXT DAY ROTATING 150 tablet 3  . potassium chloride SA (K-DUR,KLOR-CON) 20 MEQ tablet Take 1 tablet (20 mEq total) by mouth daily. 100 tablet 3  . simvastatin (ZOCOR) 20 MG tablet Take 1 tablet (20 mg total) by mouth at bedtime. 90 tablet 3  . valsartan (DIOVAN) 80 MG tablet Take 1 tablet (80 mg total) by mouth daily. 90 tablet 2   No current facility-administered medications on file prior to visit.      Past Medical History:  Diagnosis Date  . Allergy   . Arthritis   . Broken foot    left  . Cancer ([place])    ovarian  . CKD (chronic kidney disease), stage III   . Hyperlipidemia   . Hypertension   .  Obesity   . Thyroid disease    Allergies  Allergen Reactions  . Shellfish Allergy     Social History   Social History  . Marital status: Married    Spouse name: N/A  . Number of children: N/A  . Years of education: N/A   Social History Main Topics  . Smoking status: Never Smoker  . Smokeless tobacco: Never Used  . Alcohol use No  . Drug use: No  . Sexual activity: No   Other Topics Concern  . None   Social History Narrative  . None    Vitals:   09/10/16 1137  BP: 124/80  Pulse: 66  Resp: 12   Body mass index is 35.55 kg/m.   Physical Exam  Nursing note and vitals reviewed. Constitutional: She  is oriented to person, place, and time. She appears well-developed. No distress.  HENT:  Head: Atraumatic.  Eyes: Conjunctivae and EOM are normal.  Cardiovascular: Normal rate and regular rhythm.   No murmur heard. DP pulse present bilateral.  Respiratory: Effort normal and breath sounds normal. No respiratory distress.  GI: Soft. She exhibits no mass. There is no tenderness.  Musculoskeletal: She exhibits edema (mild periankle edema bilateral). She exhibits no tenderness.  Hip mild limitation of flexion,normal rotation and extension. Knees no effusion,no tenderness with palpation,mildlimited flexion, normal extension. No pain elicited, had a cramp on right LE during joint examination,lasted a few seconds.  Neurological: She is alert and oriented to person, place, and time. She has normal strength.  SLR negative bilateral. Stable gait without assistance.  Skin: Skin is warm. No erythema.  Psychiatric: She has a normal mood and affect.  Well groomed, good eye contact.      ASSESSMENT AND PLAN:     [name] was seen today for follow-up.  Diagnoses and all orders for this visit:  Osteoarthritis, unspecified osteoarthritis type, unspecified site -     diclofenac sodium (VOLTAREN) 1 % GEL; Apply 4 g topically 4 (four) times daily.  Fall precautions discussed. She agrees with trying Voltaren gel on affected joints. Continue OTC Tylenol 650 mg 3 times per day as needed.  Try to avoid activities she knows will exacerbate pain.  Handicap sticker filled out. Keep appt in 03/2017.    -[name] was advised to return sooner than planned today if new concerns arise.       Aurel Nguyen G. Martinique, MD  [facility]. [place] office.

## 2016-09-10 ENCOUNTER — Ambulatory Visit: Payer: Medicare Other | Admitting: Family Medicine

## 2016-09-10 ENCOUNTER — Encounter: Payer: Self-pay | Admitting: Family Medicine

## 2016-09-10 ENCOUNTER — Ambulatory Visit (INDEPENDENT_AMBULATORY_CARE_PROVIDER_SITE_OTHER): Payer: Medicare Other | Admitting: Family Medicine

## 2016-09-10 VITALS — BP 124/80 | HR 66 | Resp 12 | Ht 62.0 in | Wt 194.4 lb

## 2016-09-10 DIAGNOSIS — M1990 Unspecified osteoarthritis, unspecified site: Secondary | ICD-10-CM | POA: Diagnosis not present

## 2016-09-10 MED ORDER — DICLOFENAC SODIUM 1 % TD GEL: 4.0000 g | Freq: Four times a day (QID) | TRANSDERMAL | 4 refills | Status: DC

## 2016-09-10 NOTE — Patient Instructions (Signed)
A few things to remember from today's visit:   Osteoarthritis, unspecified osteoarthritis type, unspecified site - Plan: diclofenac sodium (VOLTAREN) 1 % GEL  Fall precautions. Voltaren added.   Please be sure medication list is accurate. If a new problem present, please set up appointment sooner than planned today.

## 2016-09-10 NOTE — Progress Notes (Signed)
Pre visit review using our clinic review tool, if applicable. No additional management support is needed unless otherwise documented below in the visit note. 

## 2016-09-22 ENCOUNTER — Telehealth: Payer: Self-pay

## 2016-09-22 NOTE — Telephone Encounter (Signed)
Left voicemail for patient letting her know I canceled her lab appointment for 09/23/2016. She had labs drawn 06/18/16 and there are no other labs that Dr. Martinique needs from her at this time. Advised her to call back if there is something she is concerned about.

## 2016-09-23 ENCOUNTER — Ambulatory Visit
Admission: RE | Admit: 2016-09-23 | Discharge: 2016-09-23 | Disposition: A | Discharge status: Home / Self Care | Payer: Medicare Other | Source: Ambulatory Visit | Attending: Family Medicine | Admitting: Family Medicine

## 2016-09-23 ENCOUNTER — Other Ambulatory Visit: Payer: Medicare Other

## 2016-09-23 DIAGNOSIS — Z1231 Encounter for screening mammogram for malignant neoplasm of breast: Secondary | ICD-10-CM

## 2016-10-28 ENCOUNTER — Other Ambulatory Visit: Payer: Self-pay | Admitting: Family Medicine

## 2017-03-01 NOTE — Progress Notes (Signed)
Subjective:   [name] is a 83 y.o. female who presents for Medicare Annual (Subsequent) preventive examination.  The Patient was informed that the wellness visit is to identify future health risk and educate and initiate measures that can reduce risk for increased disease through the lifespan.    Annual Wellness Assessment  Reports health as goods rec'd 2 letters from someone telling her valsartan was recalled Continued to take the medicine as she just had it refilled Will send a basket note to Dr. Martinique   Preventive Screening -Counseling & Management  Medicare Annual Preventive Care Visit - Subsequent Last OV 09/2016 Last OWV 02/2016  Diabetes with A1c 5.6 in 2013; bS 120  Colonoscopy 2000  - phased out due to age Mammogram 09/2016 dexa had this many years ago; will go ahead and repeat  Her mother had a senility fx and would like to have this checked tdap due - agreed to take today  Flu due and will take   Had the zoster vaccine and is due shingrix     PMH; Obesity; HTN; Hyperlipidemia (ratio 2016 3) ; ovarian cancer  Ruptured cyst that was cancerous [age] yo / did surgery and had some complications from chemo in abd  Hx small bowel obstruction  Hs of TKR x 83 yo  Psychosocial:  Spouse passed away x  60 yo; had prostate cancer fought this x 12 y 3 children; one in [place] one in [place]; one  [place] One grand child and one on the way. Tobacco: never smoked ETOH  No  Nephrologist follows bP  Lives alone   Medications reviewed for issues; compliance; otc meds  BMI: 35;   Diet: Breakfast; has a banana and peanut butter for breakfast Lunch; none  Dinner around [employer] and pasta  Some vegetables  Food prior to taking evening meds; Chocolate almonds or chocolate walnuts Cheddar cheese or scoop of ice cream around 8   Exercise;  Work out with a Clinical research associate once a week At [employer]; The trainer helps her do recumbent bike. Weights and stretches;    Trainer shows her what to do at home  Friend encouraged her to go!  HOME SAFETY Long term plan reviewed  13 stairs in home; bedroom upstairs;  Bathrooms; takes a shower; can get in and out  Home: level; barriers; or needs identified as bathroom railing or other review; Has been getting up to go to the bathroom x 1 during the night and this is her first time.  Fall hx; yes   Fell x 83 yo; Friday after Thanksgiving; and tripped over change in side walk; no injury/ Falls this year; NO  Activie   Given education on "Fall Prevention in the Home" for more safety tips the patient can apply as appropriate.   Personal safety issues reviewed:  1.  for risk such as safe community/ it is safe 2.  smoke detector/ yes  3.  firearms safety if applicable  4. protection when in the sun; very rarely  5. driving safety for seniors or any recent accidents. No           Advanced Directives completed  Patient Care Team: Martinique, Betty G, MD as PCP - General (Family Medicine) Assessed for additional providers  Immunization History  Administered Date(s) Administered  . Influenza Split 03/15/2011, 03/17/2012, 04/17/2013  . Influenza Whole 07/06/2004, 04/05/2009, 02/27/2010  . Influenza, High Dose Seasonal PF 02/28/2016  . Influenza-Unspecified 03/06/2014, 03/07/2015  . Pneumococcal Conjugate-13 05/21/2014  .  Pneumococcal Polysaccharide-23 07/06/2001, 01/19/2008  . Td 07/06/2005  . Zoster 01/19/2008   Required Immunizations needed today  Screening test up to date or reviewed for plan of completion Health Maintenance Due  Topic Date Due  . DEXA SCAN  10/02/2000  . TETANUS/TDAP  07/07/2015  . INFLUENZA VACCINE  02/03/2017    Ordered dexa today Agreed to take tdap and flu vaccine       Cardiac Risk Factors include: advanced age (>20men, >17 women);dyslipidemia;hypertension;microalbuminuria;obesity (BMI >30kg/m2)     Objective:     Vitals: BP 120/60   Pulse 82   Ht 5\' 2"  (1.575  m)   Wt 195 lb (88.5 kg)   SpO2 97%   BMI 35.67 kg/m   Body mass index is 35.67 kg/m.   Tobacco History  Smoking Status  . Never Smoker  Smokeless Tobacco  . Never Used     Counseling given: Yes   Past Medical History:  Diagnosis Date  . Allergy   . Arthritis   . Broken foot    left  . Cancer ([place])    ovarian  . CKD (chronic kidney disease), stage III   . Hyperlipidemia   . Hypertension   . Obesity   . Thyroid disease    Past Surgical History:  Procedure Laterality Date  . CATARACT EXTRACTION    . CHOLECYSTECTOMY    . TOTAL ABDOMINAL HYSTERECTOMY W/ BILATERAL SALPINGOOPHORECTOMY    . TOTAL KNEE ARTHROPLASTY     Family History  Problem Relation Age of Onset  . Diabetes Unknown        fhx  . Hypertension Unknown        fhx  . Obesity Unknown        fhx  . Breast cancer Neg Hx    History  Sexual Activity  . Sexual activity: No    Outpatient Encounter Prescriptions as of 03/02/2017  Medication Sig  . acetaminophen (TYLENOL) 650 MG CR tablet Take 650 mg by mouth every 8 (eight) hours as needed for pain (pain).   Marland Kitchen allopurinol (ZYLOPRIM) 100 MG tablet take 1 tablet by mouth twice a day  . amLODipine (NORVASC) 5 MG tablet Take 1 tablet (5 mg total) by mouth daily.  Marland Kitchen aspirin 81 MG EC tablet Take 81 mg by mouth daily.    . calcitRIOL (ROCALTROL) 0.25 MCG capsule Take 0.25 mcg by mouth every other day.   . calcium carbonate (TUMS) 500 MG chewable tablet Chew 1 tablet by mouth daily.    . cetirizine (ZYRTEC) 10 MG tablet Take 10 mg by mouth daily.    . furosemide (LASIX) 40 MG tablet take 1 tablet by mouth once daily  . levothyroxine (SYNTHROID, LEVOTHROID) 50 MCG tablet take 1 tablet by mouth once daily then 1/2 tablet THE NEXT DAY ROTATING  . potassium chloride SA (K-DUR,KLOR-CON) 20 MEQ tablet Take 1 tablet (20 mEq total) by mouth daily.  . simvastatin (ZOCOR) 20 MG tablet Take 1 tablet (20 mg total) by mouth at bedtime.  . valsartan (DIOVAN) 80 MG tablet  Take 1 tablet (80 mg total) by mouth daily.  . diclofenac sodium (VOLTAREN) 1 % GEL Apply 4 g topically 4 (four) times daily. (Patient not taking: Reported on 03/02/2017)   No facility-administered encounter medications on file as of 03/02/2017.     Activities of Daily Living In your present state of health, do you have any difficulty performing the following activities: 03/02/2017  Hearing? N  Vision? N  Difficulty concentrating or  making decisions? N  Walking or climbing stairs? Y  Comment uses the rail   Dressing or bathing? N  Doing errands, shopping? N  Preparing Food and eating ? N  Using the Toilet? N  In the past six months, have you accidently leaked urine? Y  Comment see UR for kidney problem   Do you have problems with loss of bowel control? N  Managing your Medications? N  Comment stopping valsartan  Managing your Finances? N  Housekeeping or managing your Housekeeping? N  Some recent data might be hidden    Patient Care Team: Martinique, Betty G, MD as PCP - General (Family Medicine)    Assessment:     Exercise Activities and Dietary recommendations Current Exercise Habits: Structured exercise class, Time (Minutes): 60, Frequency (Times/Week): 1, Weekly Exercise (Minutes/Week): 60  Goals    . patient          Between reading will move more;  The plan is to stretch, walk or strength train every chapter.   Will exercise 20 minutes every day / can be intermittent      . patient          Stay active !      Fall Risk Fall Risk  03/02/2017 06/11/2016 06/24/2015 06/24/2015 05/21/2014  Falls in the past year? No No Yes No Yes  Comment - Emmi Telephone Survey: data to providers prior to load - - -  Number falls in past yr: - - 1 - -  Injury with Fall? - - No - -   Depression Screen PHQ 2/9 Scores 03/02/2017 06/24/2015 06/24/2015 05/21/2014  PHQ - 2 Score 0 0 0 1     Cognitive Function Ad8 score reviewed for issues:  Issues making decisions:  Less interest  in hobbies / activities:  Repeats questions, stories (family complaining):  Trouble using ordinary gadgets (microwave, computer, phone):  Forgets the month or year:   Mismanaging finances:   Remembering appts:  Daily problems with thinking and/or memory: Ad8 score is=0         Immunization History  Administered Date(s) Administered  . Influenza Split 03/15/2011, 03/17/2012, 04/17/2013  . Influenza Whole 07/06/2004, 04/05/2009, 02/27/2010  . Influenza, High Dose Seasonal PF 02/28/2016  . Influenza-Unspecified 03/06/2014, 03/07/2015  . Pneumococcal Conjugate-13 05/21/2014  . Pneumococcal Polysaccharide-23 07/06/2001, 01/19/2008  . Td 07/06/2005  . Zoster 01/19/2008   Screening Tests Health Maintenance  Topic Date Due  . DEXA SCAN  10/02/2000  . TETANUS/TDAP  07/07/2015  . INFLUENZA VACCINE  02/03/2017  . PNA vac Low Risk Adult  Completed      Plan:     PCP Notes   Health Maintenance Will take flu vaccine and Tdap today  Ordered Dexa for screening as her first was prior to 43   Discussed weight loss when sick x 3 years ago with intestinal obstruction.   Abnormal Screens  The patient does have a BMI 35 states she is not restricted in any way with her activities. Is not interested in weight loss at this time.Marland Kitchen  Referrals  No referrals   Patient concerns; Patient mentioned she was on Exforge with amlodipine and losartan. States she received a recall letter from the pharmacy. Note was sent to Dr. Martinique to review for changes in prescription.   Nurse Concerns; As noted  Next PCP apt next appointment to be scheduled as needed     I have personally reviewed and noted the following in the patient's chart:   .  Medical and social history . Use of alcohol, tobacco or illicit drugs  . Current medications and supplements . Functional ability and status . Nutritional status . Physical activity . Advanced directives . List of other  physicians . Hospitalizations, surgeries, and ER visits in previous 12 months . Vitals . Screenings to include cognitive, depression, and falls . Referrals and appointments  In addition, I have reviewed and discussed with patient certain preventive protocols, quality metrics, and best practice recommendations. A written personalized care plan for preventive services as well as general preventive health recommendations were provided to patient.     Wynetta Fines, RN  03/02/2017

## 2017-03-02 ENCOUNTER — Telehealth: Payer: Self-pay

## 2017-03-02 ENCOUNTER — Ambulatory Visit (INDEPENDENT_AMBULATORY_CARE_PROVIDER_SITE_OTHER): Payer: Medicare Other

## 2017-03-02 VITALS — BP 120/60 | HR 82 | Ht 62.0 in | Wt 195.0 lb

## 2017-03-02 DIAGNOSIS — Z0000 Encounter for general adult medical examination without abnormal findings: Secondary | ICD-10-CM

## 2017-03-02 DIAGNOSIS — E2839 Other primary ovarian failure: Secondary | ICD-10-CM

## 2017-03-02 DIAGNOSIS — Z23 Encounter for immunization: Secondary | ICD-10-CM | POA: Diagnosis not present

## 2017-03-02 NOTE — Patient Instructions (Addendum)
[name] , Thank you for taking time to come for your Medicare Wellness Visit. I appreciate your ongoing commitment to your health goals. Please review the following plan we discussed and let me know if I can assist you in the future.   Valsartan on hold due to recall; basket to Dr.Jordan    A Tetanus is recommended every 10 years. Medicare covers a tetanus if you have a cut or wound; otherwise, there may be a charge. If you had not had a tetanus with pertusses, known as the Tdap, you can take this anytime.   Can take flu and Tdap today   Shingrix is a vaccine for the prevention of Shingles in Adults 50 and older.  If you are on Medicare, you can request a prescription from your doctor to be filled at a pharmacy.  Please check with your benefits regarding applicable copays or out of pocket expenses.  The Shingrix is given in 2 vaccines approx 8 weeks apart. You must receive the 2nd dose prior to 6 months from receipt of the first.    These are the goals we discussed: Goals    . patient          Between reading will move more;  The plan is to stretch, walk or strength train every chapter.   Will exercise 20 minutes every day / can be intermittent         This is a list of the screening recommended for you and due dates:  Health Maintenance  Topic Date Due  . DEXA scan (bone density measurement)  10/02/2000  . Tetanus Vaccine  07/07/2015  . Flu Shot  02/03/2017  . Pneumonia vaccines  Completed    Dexa scan ordered  tdap given today Flu vaccine  Fall Prevention in the Home Falls can cause injuries and can affect people from all age groups. There are many simple things that you can do to make your home safe and to help prevent falls. What can I do on the outside of my home?  Regularly repair the edges of walkways and driveways and fix any cracks.  Remove high doorway thresholds.  Trim any shrubbery on the main path into your home.  Use bright outdoor  lighting.  Clear walkways of debris and clutter, including tools and rocks.  Regularly check that handrails are securely fastened and in good repair. Both sides of any steps should have handrails.  Install guardrails along the edges of any raised decks or porches.  Have leaves, snow, and ice cleared regularly.  Use sand or salt on walkways during winter months.  In the garage, clean up any spills right away, including grease or oil spills. What can I do in the bathroom?  Use night lights.  Install grab bars by the toilet and in the tub and shower. Do not use towel bars as grab bars.  Use non-skid mats or decals on the floor of the tub or shower.  If you need to sit down while you are in the shower, use a plastic, non-slip stool.  Keep the floor dry. Immediately clean up any water that spills on the floor.  Remove soap buildup in the tub or shower on a regular basis.  Attach bath mats securely with double-sided non-slip rug tape.  Remove throw rugs and other tripping hazards from the floor. What can I do in the bedroom?  Use night lights.  Make sure that a bedside light is easy to reach.  Do not  use oversized bedding that drapes onto the floor.  Have a firm chair that has side arms to use for getting dressed.  Remove throw rugs and other tripping hazards from the floor. What can I do in the kitchen?  Clean up any spills right away.  Avoid walking on wet floors.  Place frequently used items in easy-to-reach places.  If you need to reach for something above you, use a sturdy step stool that has a grab bar.  Keep electrical cables out of the way.  Do not use floor polish or wax that makes floors slippery. If you have to use wax, make sure that it is non-skid floor wax.  Remove throw rugs and other tripping hazards from the floor. What can I do in the stairways?  Do not leave any items on the stairs.  Make sure that there are handrails on both sides of the  stairs. Fix handrails that are broken or loose. Make sure that handrails are as long as the stairways.  Check any carpeting to make sure that it is firmly attached to the stairs. Fix any carpet that is loose or worn.  Avoid having throw rugs at the top or bottom of stairways, or secure the rugs with carpet tape to prevent them from moving.  Make sure that you have a light switch at the top of the stairs and the bottom of the stairs. If you do not have them, have them installed. What are some other fall prevention tips?  Wear closed-toe shoes that fit well and support your feet. Wear shoes that have rubber soles or low heels.  When you use a stepladder, make sure that it is completely opened and that the sides are firmly locked. Have someone hold the ladder while you are using it. Do not climb a closed stepladder.  Add color or contrast paint or tape to grab bars and handrails in your home. Place contrasting color strips on the first and last steps.  Use mobility aids as needed, such as canes, walkers, scooters, and crutches.  Turn on lights if it is dark. Replace any light bulbs that burn out.  Set up furniture so that there are clear paths. Keep the furniture in the same spot.  Fix any uneven floor surfaces.  Choose a carpet design that does not hide the edge of steps of a stairway.  Be aware of any and all pets.  Review your medicines with your healthcare provider. Some medicines can cause dizziness or changes in blood pressure, which increase your risk of falling. Talk with your health care provider about other ways that you can decrease your risk of falls. This may include working with a physical therapist or trainer to improve your strength, balance, and endurance. This information is not intended to replace advice given to you by your health care provider. Make sure you discuss any questions you have with your health care provider. Document Released: 06/12/2002 Document Revised:  11/19/2015 Document Reviewed: 07/27/2014 Elsevier Interactive Patient Education  2017 Elsevier Inc.  Summary: Preventive Care for Adults  A healthy lifestyle and preventive care can promote health and wellness. Preventive health guidelines for adults include the following key practices.  . A routine yearly physical is a good way to check with your health care provider about your health and preventive screening. It is a chance to share any concerns and updates on your health and to receive a thorough exam.  . Visit your dentist for a routine exam and preventive  care every 6 months. Brush your teeth twice a day and floss once a day. Good oral hygiene prevents tooth decay and gum disease.  . The frequency of eye exams is based on your age, health, family medical history, use  of contact lenses, and other factors. Follow your health care provider's ecommendations for frequency of eye exams.  . Eat a healthy diet. Foods like vegetables, fruits, whole grains, low-fat dairy products, and lean protein foods contain the nutrients you need without too many calories. Decrease your intake of foods high in solid fats, added sugars, and salt. Eat the right amount of calories for you. Get information about a proper diet from your health care provider, if necessary.  . Regular physical exercise is one of the most important things you can do for your health. Most adults should get at least 150 minutes of moderate-intensity exercise (any activity that increases your heart rate and causes you to sweat) each week. In addition, most adults need muscle-strengthening exercises on 2 or more days a week.  Silver Sneakers may be a benefit available to you. To determine eligibility, you may visit the website: www.silversneakers.com or contact program at 435-489-8413 Mon-Fri between 8AM-8PM.   . Maintain a healthy weight. The body mass index (BMI) is a screening tool to identify possible weight problems. It  provides an estimate of body fat based on height and weight. Your health care provider can find your BMI and can help you achieve or maintain a healthy weight.   For adults 20 years and older: ? A BMI below 18.5 is considered underweight. ? A BMI of 18.5 to 24.9 is normal. ? A BMI of 27 to 28 is considered normal by the Institutes of Health  ? A BMI of 30 and above is considered obese.   . Maintain normal blood lipids and cholesterol levels by exercising and minimizing your intake of saturated fat. Eat a balanced diet with plenty of fruit and vegetables. Blood tests for lipids and cholesterol should begin at age 53 and be repeated every 5 years. If your lipid or cholesterol levels are high, you are over 50, or you are at high risk for heart disease, you may need your cholesterol levels checked more frequently. Ongoing high lipid and cholesterol levels should be treated with medicines if diet and exercise are not working.  . If you smoke, find out from your health care provider how to quit. If you do not use tobacco, please do not start.  . If you choose to drink alcohol, please do not consume more than one drink for women and 2 for men.  One drink is considered to be 12 ounces (355 mL) of beer, 5 ounces (148 mL) of wine, or 1.5 ounces (44 mL) of liquor. Moderation of alcohol intake to this level decreases your risk of breast cancer and liver damage.   . If you are 68-62 years old, ask your health care provider if you should take aspirin to prevent strokes.  . Use sunscreen. Apply sunscreen liberally and repeatedly throughout the day. You should seek shade when your shadow is shorter than you. Protect yourself by wearing long sleeves, pants, a wide-brimmed hat, and sunglasses year round, whenever you are outdoors.  . Once a month, do a whole body skin exam, using a mirror to look at the skin on your back. Tell your health care provider of new moles, moles that have irregular borders,  moles that are larger than a pencil eraser, or moles  that have changed in shape or color.  Last, if you have completed an Advanced Directive; please bring a copy and review with your physician and then we will scan to the medical record      Bone Densitometry Bone densitometry is an imaging test that uses a special X-ray to measure the amount of calcium and other minerals in your bones (bone density). This test is also known as a bone mineral density test or dual-energy X-ray absorptiometry (DXA). The test can measure bone density at your hip and your spine. It is similar to having a regular X-ray. You may have this test to:  Diagnose a condition that causes weak or thin bones (osteoporosis).  Predict your risk of a broken bone (fracture).  Determine how well osteoporosis treatment is working.  Tell a health care provider about:  Any allergies you have.  All medicines you are taking, including vitamins, herbs, eye drops, creams, and over-the-counter medicines.  Any problems you or family members have had with anesthetic medicines.  Any blood disorders you have.  Any surgeries you have had.  Any medical conditions you have.  Possibility of pregnancy.  Any other medical test you had within the previous 14 days that used contrast material. What are the risks? Generally, this is a safe procedure. However, problems can occur and may include the following:  This test exposes you to a very small amount of radiation.  The risks of radiation exposure may be greater to unborn children.  What happens before the procedure?  Do not take any calcium supplements for 24 hours before having the test. You can otherwise eat and drink what you usually do.  Take off all metal jewelry, eyeglasses, dental appliances, and any other metal objects. What happens during the procedure?  You may lie on an exam table. There will be an X-ray generator below you and an imaging device above  you.  Other devices, such as boxes or braces, may be used to position your body properly for the scan.  You will need to lie still while the machine slowly scans your body.  The images will show up on a computer monitor. What happens after the procedure? You may need more testing at a later time. This information is not intended to replace advice given to you by your health care provider. Make sure you discuss any questions you have with your health care provider. Document Released: 07/14/2004 Document Revised: 11/28/2015 Document Reviewed: 11/30/2013 Elsevier Interactive Patient Education  2018 [employer].

## 2017-03-02 NOTE — Telephone Encounter (Addendum)
The patient is in for AWV. States she rec'd 2 letters regarding the recall of Valsartan. States she just got a rx and has been continuing to take it but is about out now. (states she was taking EXFORGE) and not separately as listed in med )  Please advise  Tks

## 2017-03-03 NOTE — Telephone Encounter (Signed)
Contact pharmacy and have new Rx for Valsartan that was not contaminated or fill Rx in a different pharmacy. If above is not possible she needs to take Amlodipine (same dose she is taking/Exforge) and replace Valsartan for Benicar 10 mg or Losartan 25 mg. If she changes to a different agent, she needs f/u in 4-6 weeks for HTN.  Thanks, BJ

## 2017-03-03 NOTE — Progress Notes (Signed)
I have reviewed documentation from this visit and I agree with recommendations given.  Bryley Chrisman G. Kalianne Fetting, MD  [facility]. [place] office.   

## 2017-03-04 ENCOUNTER — Other Ambulatory Visit: Payer: Self-pay

## 2017-03-04 DIAGNOSIS — I10 Essential (primary) hypertension: Secondary | ICD-10-CM

## 2017-03-04 MED ORDER — AMLODIPINE BESYLATE 5 MG PO TABS: 5.0000 mg | ORAL_TABLET | Freq: Every day | ORAL | 1 refills | Status: DC

## 2017-03-04 MED ORDER — LOSARTAN POTASSIUM 25 MG PO TABS: 25.0000 mg | ORAL_TABLET | Freq: Every day | ORAL | 1 refills | Status: DC

## 2017-03-04 NOTE — Telephone Encounter (Signed)
Call to Ms. Kirn Will check with the pharmacy at [facility] per ms. Vergara for valsartan /  Will ask about exforge if this was recalled?  Will fup with the patient

## 2017-03-04 NOTE — Telephone Encounter (Signed)
Call to ms. Burling to pull her rx bottle. Now states she was not taking Exforge but Amlodipine and Valsartan as ordered separately   Recall on Valsartan confirmed with pharmacy  Re-ordered amlodipine 5mg  at x 90 days with one refill Ordered Losartan 25 mg po q d x 30 with one refill  Discontinued Valsartan due to recall  The patient was informed the med would be called in to the pharmacy.  Agreed to check her BP at home 3 to 4 times a week and let Dr. Martinique know if it runs over 140 90.  Apt scheduled for 10/ 3 at 2: 30 to fup for BP changes  Therapist, art

## 2017-03-07 ENCOUNTER — Other Ambulatory Visit: Payer: Self-pay | Admitting: Family Medicine

## 2017-04-02 ENCOUNTER — Other Ambulatory Visit: Payer: Self-pay | Admitting: Family Medicine

## 2017-04-06 NOTE — Progress Notes (Signed)
[name] is a 83 y.o.female, who is here today to follow on HTN.  Currently she is on Amlodipine 5 mg daily and Cozaar 25 mg daily. She was on Valsartan but due to recent medication recall she was changed to Cozaar, started it about 2 weeks ago.  She is taking medications as instructed.   She has not noted unusual headache, visual changes, exertional chest pain, dyspnea,  focal weakness, or edema.    Lab Results  Component Value Date   CREATININE 1.46 (H) 06/18/2016   BUN 32 (H) 06/18/2016   NA 139 06/18/2016   K 4.0 06/18/2016   CL 103 06/18/2016   CO2 27 06/18/2016   For the past week she has had some issues with memory and dizziness/lightheadedness. She is concerned about Cozaar is causing side effects. Intermittent dizziness exacerbated  by leaning over.SHe cannot describe dizzy episodes, last a few seconds and no associated symptoms.  Also having trouble finding words , more noticeable when she is playing crosswords. She feels like she goes "blank" for a few seconds, eventually she can find word.  While having conversations she is "having trouble coming up with words", making "simple mistakes." She also put trash in the recycle container but states that somebody did switch place of trash and recycle container but she did not realized right away. She denies episodes of confusion, abdominal pain,nausea,or vomiting. No changes in urinary frequency, dysuria, or gross hematuria.  Hx of CKD III and hypoK+.   Review of Systems  Constitutional: Negative for activity change, appetite change, fatigue and fever.  HENT: Negative for mouth sores, nosebleeds and trouble swallowing.   Eyes: Negative for redness and visual disturbance.  Respiratory: Negative for cough, shortness of breath and wheezing.   Cardiovascular: Negative for chest pain, palpitations and leg swelling.  Gastrointestinal: Negative for abdominal pain, nausea and vomiting.       Negative for changes in  bowel habits.  Endocrine: Negative for cold intolerance and heat intolerance.  Genitourinary: Negative for decreased urine volume, dysuria and hematuria.  Musculoskeletal: Negative for gait problem and myalgias.  Skin: Negative for pallor and rash.  Neurological: Positive for light-headedness. Negative for syncope, weakness, numbness and headaches.  Psychiatric/Behavioral: Negative for confusion. The patient is nervous/anxious.      Current Outpatient Prescriptions on File Prior to Visit  Medication Sig Dispense Refill  . acetaminophen (TYLENOL) 650 MG CR tablet Take 650 mg by mouth every 8 (eight) hours as needed for pain (pain).     Marland Kitchen allopurinol (ZYLOPRIM) 100 MG tablet TAKE 1 TABLET BY MOUTH TWICE DAILY 180 tablet 1  . amLODipine (NORVASC) 5 MG tablet Take 1 tablet (5 mg total) by mouth daily. 90 tablet 1  . aspirin 81 MG EC tablet Take 81 mg by mouth daily.      . calcitRIOL (ROCALTROL) 0.25 MCG capsule Take 0.25 mcg by mouth every other day.     . calcium carbonate (TUMS) 500 MG chewable tablet Chew 1 tablet by mouth daily.      . cetirizine (ZYRTEC) 10 MG tablet Take 10 mg by mouth daily.      . furosemide (LASIX) 40 MG tablet take 1 tablet by mouth once daily 100 tablet 3  . levothyroxine (SYNTHROID, LEVOTHROID) 50 MCG tablet take 1 tablet by mouth once daily then 1/2 tablet THE NEXT DAY ROTATING 90 tablet 3  . potassium chloride SA (K-DUR,KLOR-CON) 20 MEQ tablet Take 1 tablet (20 mEq total) by mouth  daily. 100 tablet 3  . simvastatin (ZOCOR) 20 MG tablet Take 1 tablet (20 mg total) by mouth at bedtime. 90 tablet 3  . diclofenac sodium (VOLTAREN) 1 % GEL Apply 4 g topically 4 (four) times daily. (Patient not taking: Reported on 03/02/2017) 5 Tube 4   No current facility-administered medications on file prior to visit.      Past Medical History:  Diagnosis Date  . Allergy   . Arthritis   . Broken foot    left  . Cancer ([place])    ovarian  . CKD (chronic kidney disease), stage  III ([place])   . Hyperlipidemia   . Hypertension   . Obesity   . Thyroid disease     Allergies  Allergen Reactions  . Shellfish Allergy     Social History   Social History  . Marital status: Married    Spouse name: N/A  . Number of children: N/A  . Years of education: N/A   Social History Main Topics  . Smoking status: Never Smoker  . Smokeless tobacco: Never Used  . Alcohol use No  . Drug use: No  . Sexual activity: No   Other Topics Concern  . None   Social History Narrative  . None    Vitals:   04/07/17 1416 04/07/17 1503  BP: 122/62 115/60  Pulse: 92   Resp: 12   SpO2: 98%    Body mass index is 35.55 kg/m.   Physical Exam  Nursing note and vitals reviewed. Constitutional: She is oriented to person, place, and time. She appears well-developed. No distress.  HENT:  Head: Atraumatic.  Mouth/Throat: Oropharynx is clear and moist and mucous membranes are normal.  Eyes: Pupils are equal, round, and reactive to light. Conjunctivae and EOM are normal.  Cardiovascular: Normal rate and regular rhythm.   No murmur heard. Pulses:      Dorsalis pedis pulses are 2+ on the right side, and 2+ on the left side.  Respiratory: Effort normal and breath sounds normal. No respiratory distress.  GI: Soft. She exhibits no mass. There is no hepatomegaly. There is no tenderness.  Musculoskeletal: She exhibits no edema.  Lymphadenopathy:    She has no cervical adenopathy.  Neurological: She is alert and oriented to person, place, and time. She has normal strength. No cranial nerve deficit. Coordination normal.  Stable gait with no assistance. She has some difficulty finding words during our conversation, anxiety seems to make it worse. 3 work recall normal. Mild hand tremor, bilateral. No present at rest.  Skin: Skin is warm. No erythema.  Psychiatric: Her mood appears anxious.  Well groomed, good eye contact.    ASSESSMENT AND PLAN:   [name] was seen today for  follow-up.  Diagnoses and all orders for this visit:  Essential hypertension  BP re-checked 115/60. Cozaar discontinued. Will consider resuming Valsartan. Continue Amlodipine 5 mg. Monitor BP at home. Low salt diet to continue. F/U in 6 weeks.  -     Basic metabolic panel  Lightheadedness  We dicussed possible etiologies ? Med side effects, dehydration,hypotension,electrolyte abnormality among some. Fall precautions discussed..  Word finding difficulty  Neurologic examination today normal. Cozaar discontinue. Instructed about warning signs.      -[name] advised to return sooner than planned today if new concerns arise.     Brandy Waldvogel G. Martinique, MD  [facility]. [place] office.

## 2017-04-07 ENCOUNTER — Encounter: Payer: Self-pay | Admitting: Family Medicine

## 2017-04-07 ENCOUNTER — Ambulatory Visit (INDEPENDENT_AMBULATORY_CARE_PROVIDER_SITE_OTHER): Payer: Medicare Other | Admitting: Family Medicine

## 2017-04-07 VITALS — BP 115/60 | HR 92 | Resp 12 | Ht 62.0 in | Wt 194.4 lb

## 2017-04-07 DIAGNOSIS — R4789 Other speech disturbances: Secondary | ICD-10-CM | POA: Diagnosis not present

## 2017-04-07 DIAGNOSIS — I10 Essential (primary) hypertension: Secondary | ICD-10-CM | POA: Diagnosis not present

## 2017-04-07 DIAGNOSIS — R42 Dizziness and giddiness: Secondary | ICD-10-CM

## 2017-04-07 NOTE — Patient Instructions (Addendum)
A few things to remember from today's visit:   Essential hypertension - Plan: Basic metabolic panel  Lightheadedness  Stop Cozzar. Rest no changes.  Monitor blood pressure at home.  Blood pressure goal for most people is less than 140/90. Some populations (older than 60) the goal is less than 150/90.  Most recent cardiologists' recommendations recommend blood pressure at or less than 130/80.   Elevated blood pressure increases the risk of strokes, heart and kidney disease, and eye problems. Regular physical activity and a healthy diet (DASH diet) usually help. Low salt diet. Take medications as instructed.  Caution with some over the counter medications as cold medications, dietary products (for weight loss), and Ibuprofen or Aleve (frequent use);all these medications could cause elevation of blood pressure.   Please be sure medication list is accurate. If a new problem present, please set up appointment sooner than planned today.

## 2017-04-08 LAB — BASIC METABOLIC PANEL
BUN: 26 mg/dL — AB (ref 6–23)
CO2: 25 meq/L (ref 19–32)
CREATININE: 1.42 mg/dL — AB (ref 0.40–1.20)
Calcium: 10.3 mg/dL (ref 8.4–10.5)
Chloride: 99 mEq/L (ref 96–112)
GFR: 37.69 mL/min — ABNORMAL LOW (ref >60.00)
GLUCOSE: 129 mg/dL — AB (ref 70–99)
POTASSIUM: 4.1 meq/L (ref 3.5–5.1)
SODIUM: 138 meq/L (ref 135–145)

## 2017-04-28 ENCOUNTER — Ambulatory Visit
Admission: RE | Admit: 2017-04-28 | Discharge: 2017-04-28 | Disposition: A | Discharge status: Home / Self Care | Payer: Medicare Other | Source: Ambulatory Visit | Attending: Family Medicine | Admitting: Family Medicine

## 2017-04-28 DIAGNOSIS — M85852 Other specified disorders of bone density and structure, left thigh: Secondary | ICD-10-CM | POA: Diagnosis not present

## 2017-04-28 DIAGNOSIS — E2839 Other primary ovarian failure: Secondary | ICD-10-CM

## 2017-04-28 DIAGNOSIS — Z780 Asymptomatic menopausal state: Secondary | ICD-10-CM | POA: Diagnosis not present

## 2017-05-06 ENCOUNTER — Telehealth: Payer: Self-pay | Admitting: Family Medicine

## 2017-05-06 NOTE — Telephone Encounter (Signed)
[name] pt returned your call.

## 2017-05-07 NOTE — Telephone Encounter (Signed)
Spoke with patient about results.

## 2017-05-07 NOTE — Telephone Encounter (Signed)
[name] please call back pt

## 2017-05-14 ENCOUNTER — Ambulatory Visit: Payer: Medicare Other | Admitting: Family Medicine

## 2017-05-17 DIAGNOSIS — R809 Proteinuria, unspecified: Secondary | ICD-10-CM | POA: Diagnosis not present

## 2017-05-17 DIAGNOSIS — Z8543 Personal history of malignant neoplasm of ovary: Secondary | ICD-10-CM | POA: Diagnosis not present

## 2017-05-17 DIAGNOSIS — I129 Hypertensive chronic kidney disease with stage 1 through stage 4 chronic kidney disease, or unspecified chronic kidney disease: Secondary | ICD-10-CM | POA: Diagnosis not present

## 2017-05-17 DIAGNOSIS — Z87440 Personal history of urinary (tract) infections: Secondary | ICD-10-CM | POA: Diagnosis not present

## 2017-05-17 DIAGNOSIS — E782 Mixed hyperlipidemia: Secondary | ICD-10-CM | POA: Diagnosis not present

## 2017-05-17 DIAGNOSIS — M109 Gout, unspecified: Secondary | ICD-10-CM | POA: Diagnosis not present

## 2017-05-17 DIAGNOSIS — N184 Chronic kidney disease, stage 4 (severe): Secondary | ICD-10-CM | POA: Diagnosis not present

## 2017-05-17 DIAGNOSIS — M1990 Unspecified osteoarthritis, unspecified site: Secondary | ICD-10-CM | POA: Diagnosis not present

## 2017-05-17 DIAGNOSIS — E038 Other specified hypothyroidism: Secondary | ICD-10-CM | POA: Diagnosis not present

## 2017-05-17 DIAGNOSIS — N2581 Secondary hyperparathyroidism of renal origin: Secondary | ICD-10-CM | POA: Diagnosis not present

## 2017-05-17 DIAGNOSIS — D631 Anemia in chronic kidney disease: Secondary | ICD-10-CM | POA: Diagnosis not present

## 2017-05-19 ENCOUNTER — Ambulatory Visit: Payer: Medicare Other | Admitting: Family Medicine

## 2017-06-01 ENCOUNTER — Other Ambulatory Visit: Payer: Self-pay | Admitting: Family Medicine

## 2017-06-01 DIAGNOSIS — E785 Hyperlipidemia, unspecified: Secondary | ICD-10-CM

## 2017-06-02 DIAGNOSIS — N184 Chronic kidney disease, stage 4 (severe): Secondary | ICD-10-CM | POA: Diagnosis not present

## 2017-06-16 DIAGNOSIS — H43813 Vitreous degeneration, bilateral: Secondary | ICD-10-CM | POA: Diagnosis not present

## 2017-06-16 DIAGNOSIS — E119 Type 2 diabetes mellitus without complications: Secondary | ICD-10-CM | POA: Diagnosis not present

## 2017-06-16 DIAGNOSIS — H5213 Myopia, bilateral: Secondary | ICD-10-CM | POA: Diagnosis not present

## 2017-06-16 DIAGNOSIS — H353121 Nonexudative age-related macular degeneration, left eye, early dry stage: Secondary | ICD-10-CM | POA: Diagnosis not present

## 2017-06-16 LAB — HM DIABETES EYE EXAM

## 2017-06-18 ENCOUNTER — Encounter: Payer: Self-pay | Admitting: Family Medicine

## 2017-06-23 ENCOUNTER — Other Ambulatory Visit: Payer: Self-pay | Admitting: Family Medicine

## 2017-07-16 ENCOUNTER — Other Ambulatory Visit: Payer: Self-pay | Admitting: Family Medicine

## 2017-08-13 ENCOUNTER — Other Ambulatory Visit: Payer: Self-pay | Admitting: Family Medicine

## 2017-08-13 DIAGNOSIS — Z1231 Encounter for screening mammogram for malignant neoplasm of breast: Secondary | ICD-10-CM

## 2017-08-30 ENCOUNTER — Other Ambulatory Visit: Payer: Self-pay | Admitting: Family Medicine

## 2017-08-30 DIAGNOSIS — I10 Essential (primary) hypertension: Secondary | ICD-10-CM

## 2017-09-22 ENCOUNTER — Other Ambulatory Visit: Payer: Self-pay | Admitting: Family Medicine

## 2017-09-24 ENCOUNTER — Ambulatory Visit
Admission: RE | Admit: 2017-09-24 | Discharge: 2017-09-24 | Disposition: A | Discharge status: Home / Self Care | Payer: Medicare Other | Source: Ambulatory Visit | Attending: Family Medicine | Admitting: Family Medicine

## 2017-09-24 DIAGNOSIS — Z1231 Encounter for screening mammogram for malignant neoplasm of breast: Secondary | ICD-10-CM

## 2017-09-29 ENCOUNTER — Ambulatory Visit: Payer: Medicare Other | Admitting: Family Medicine

## 2017-09-30 ENCOUNTER — Other Ambulatory Visit: Payer: Self-pay | Admitting: Family Medicine

## 2017-11-24 ENCOUNTER — Other Ambulatory Visit: Payer: Self-pay | Admitting: Family Medicine

## 2017-11-25 ENCOUNTER — Other Ambulatory Visit: Payer: Self-pay | Admitting: Family Medicine

## 2017-11-26 ENCOUNTER — Other Ambulatory Visit: Payer: Self-pay | Admitting: Family Medicine

## 2017-11-26 DIAGNOSIS — I10 Essential (primary) hypertension: Secondary | ICD-10-CM

## 2017-11-29 ENCOUNTER — Other Ambulatory Visit: Payer: Self-pay | Admitting: Family Medicine

## 2017-11-29 DIAGNOSIS — E785 Hyperlipidemia, unspecified: Secondary | ICD-10-CM

## 2017-12-20 ENCOUNTER — Other Ambulatory Visit: Payer: Self-pay | Admitting: Family Medicine

## 2017-12-20 DIAGNOSIS — Z8543 Personal history of malignant neoplasm of ovary: Secondary | ICD-10-CM | POA: Diagnosis not present

## 2017-12-20 DIAGNOSIS — Z87440 Personal history of urinary (tract) infections: Secondary | ICD-10-CM | POA: Diagnosis not present

## 2017-12-20 DIAGNOSIS — I129 Hypertensive chronic kidney disease with stage 1 through stage 4 chronic kidney disease, or unspecified chronic kidney disease: Secondary | ICD-10-CM | POA: Diagnosis not present

## 2017-12-20 DIAGNOSIS — M1990 Unspecified osteoarthritis, unspecified site: Secondary | ICD-10-CM | POA: Diagnosis not present

## 2017-12-20 DIAGNOSIS — N2581 Secondary hyperparathyroidism of renal origin: Secondary | ICD-10-CM | POA: Diagnosis not present

## 2017-12-20 DIAGNOSIS — E782 Mixed hyperlipidemia: Secondary | ICD-10-CM | POA: Diagnosis not present

## 2017-12-20 DIAGNOSIS — M109 Gout, unspecified: Secondary | ICD-10-CM | POA: Diagnosis not present

## 2017-12-20 DIAGNOSIS — R809 Proteinuria, unspecified: Secondary | ICD-10-CM | POA: Diagnosis not present

## 2017-12-20 DIAGNOSIS — E038 Other specified hypothyroidism: Secondary | ICD-10-CM | POA: Diagnosis not present

## 2017-12-20 DIAGNOSIS — D631 Anemia in chronic kidney disease: Secondary | ICD-10-CM | POA: Diagnosis not present

## 2017-12-20 DIAGNOSIS — N390 Urinary tract infection, site not specified: Secondary | ICD-10-CM | POA: Diagnosis not present

## 2017-12-20 DIAGNOSIS — N184 Chronic kidney disease, stage 4 (severe): Secondary | ICD-10-CM | POA: Diagnosis not present

## 2017-12-23 ENCOUNTER — Other Ambulatory Visit: Payer: Self-pay | Admitting: Family Medicine

## 2017-12-23 NOTE — Telephone Encounter (Signed)
Last OV 04/07/2017

## 2018-01-13 DIAGNOSIS — N390 Urinary tract infection, site not specified: Secondary | ICD-10-CM | POA: Diagnosis not present

## 2018-01-16 ENCOUNTER — Other Ambulatory Visit: Payer: Self-pay | Admitting: Family Medicine

## 2018-01-19 DIAGNOSIS — N390 Urinary tract infection, site not specified: Secondary | ICD-10-CM | POA: Diagnosis not present

## 2018-02-13 ENCOUNTER — Other Ambulatory Visit: Payer: Self-pay | Admitting: Family Medicine

## 2018-02-13 DIAGNOSIS — I10 Essential (primary) hypertension: Secondary | ICD-10-CM

## 2018-02-13 DIAGNOSIS — E785 Hyperlipidemia, unspecified: Secondary | ICD-10-CM

## 2018-02-25 DIAGNOSIS — H353121 Nonexudative age-related macular degeneration, left eye, early dry stage: Secondary | ICD-10-CM | POA: Diagnosis not present

## 2018-02-25 DIAGNOSIS — H5213 Myopia, bilateral: Secondary | ICD-10-CM | POA: Diagnosis not present

## 2018-02-25 DIAGNOSIS — E119 Type 2 diabetes mellitus without complications: Secondary | ICD-10-CM | POA: Diagnosis not present

## 2018-02-25 DIAGNOSIS — H43813 Vitreous degeneration, bilateral: Secondary | ICD-10-CM | POA: Diagnosis not present

## 2018-02-25 LAB — HM DIABETES EYE EXAM

## 2018-03-02 ENCOUNTER — Encounter: Payer: Self-pay | Admitting: Family Medicine

## 2018-03-04 ENCOUNTER — Ambulatory Visit: Payer: Medicare Other | Admitting: Family Medicine

## 2018-03-15 ENCOUNTER — Other Ambulatory Visit: Payer: Self-pay | Admitting: Family Medicine

## 2018-03-18 ENCOUNTER — Ambulatory Visit (INDEPENDENT_AMBULATORY_CARE_PROVIDER_SITE_OTHER): Payer: Medicare Other | Admitting: Family Medicine

## 2018-03-18 VITALS — BP 124/82 | HR 86 | Temp 98.2°F | Resp 16 | Ht 62.0 in | Wt 192.4 lb

## 2018-03-18 DIAGNOSIS — E039 Hypothyroidism, unspecified: Secondary | ICD-10-CM

## 2018-03-18 DIAGNOSIS — Z0000 Encounter for general adult medical examination without abnormal findings: Secondary | ICD-10-CM

## 2018-03-18 DIAGNOSIS — E785 Hyperlipidemia, unspecified: Secondary | ICD-10-CM

## 2018-03-18 DIAGNOSIS — L658 Other specified nonscarring hair loss: Secondary | ICD-10-CM

## 2018-03-18 DIAGNOSIS — N183 Chronic kidney disease, stage 3 unspecified: Secondary | ICD-10-CM

## 2018-03-18 DIAGNOSIS — I10 Essential (primary) hypertension: Secondary | ICD-10-CM

## 2018-03-18 DIAGNOSIS — L03031 Cellulitis of right toe: Secondary | ICD-10-CM | POA: Diagnosis not present

## 2018-03-18 DIAGNOSIS — Z23 Encounter for immunization: Secondary | ICD-10-CM | POA: Diagnosis not present

## 2018-03-18 LAB — LIPID PANEL
Cholesterol: 183 mg/dL (ref 0–200)
HDL: 68.3 mg/dL (ref >39.00)
LDL Cholesterol: 89 mg/dL (ref 0–99)
NonHDL: 114.46
TRIGLYCERIDES: 125 mg/dL (ref 0.0–149.0)
Total CHOL/HDL Ratio: 3
VLDL: 25 mg/dL (ref 0.0–40.0)

## 2018-03-18 LAB — TSH: TSH: 2.73 u[IU]/mL (ref 0.35–4.50)

## 2018-03-18 LAB — HEPATIC FUNCTION PANEL
ALBUMIN: 4.6 g/dL (ref 3.5–5.2)
ALK PHOS: 82 U/L (ref 39–117)
ALT: 16 U/L (ref 0–35)
AST: 20 U/L (ref 0–37)
Bilirubin, Direct: 0.1 mg/dL (ref 0.0–0.3)
TOTAL PROTEIN: 7 g/dL (ref 6.0–8.3)
Total Bilirubin: 0.8 mg/dL (ref 0.2–1.2)

## 2018-03-18 MED ORDER — CEPHALEXIN 500 MG PO CAPS: 500.0000 mg | ORAL_CAPSULE | Freq: Two times a day (BID) | ORAL | 0 refills | Status: AC

## 2018-03-18 NOTE — Progress Notes (Signed)
HPI:   [name] is a 84 y.o. female, who is here today for follow up and Medicare Wellness visit.   She was last seen on 04/07/17.  Since her last OV she has nephrologist,she follows q 3-4 months. No new problems since her last OV.     Hypertension:  Currently on Amlodipine 5 mg daily . Losartan was discontinued.   She takes Furosemide 40 mg daily. HypoK+,she is on K-LOR 20 meq daily.  She is taking medications as instructed, no side effects reported.  She has not noted unusual headache, visual changes, exertional chest pain, dyspnea,  focal weakness, or edema.   Lab Results  Component Value Date   CREATININE 1.42 (H) 04/07/2017   BUN 26 (H) 04/07/2017   NA 138 04/07/2017   K 4.1 04/07/2017   CL 99 04/07/2017   CO2 25 04/07/2017    Hypothyroidism:  Currently she is on Levothyroxine 50 mcg,alternates between 1 and 1/2 tab daily.. Tolerating medication well, no side effects reported. She has not noted dysphagia, palpitations, abdominal pain, changes in bowel habits, tremor, cold/heat intolerance, or abnormal weight loss.  Lab Results  Component Value Date   TSH 3.35 06/18/2016   Gout: On Allopurinol 100 mg daily. She has not had a flare up in years.  She is not using Voltaren gel, joint pain is better now.  Hyperlipidemia: She is on Zocor 20 mg daily. Lipid panel 06/2016: TC 186, TG 175,HDL 75.7,and LDL 76. She follows low fat diet.  Today she is concerned about possible infection of left great toe nail. For the past few days she has had periungual erythema and edema + tenderness. A couple days ago she drained some "pus." No fever or chills. It is improving.   Also concerned about hair loss for the past few months. No alopecic areas or scalp lesions. She has not used OTC products.  She lives alone. Her son lives a bout 45 min from her.  Independent ADL's and IADL's. No falls in the past year and denies depression  symptoms.  Functional Status Survey: Is the patient deaf or have difficulty hearing?: No Does the patient have difficulty seeing, even when wearing glasses/contacts?: No Does the patient have difficulty concentrating, remembering, or making decisions?: No Does the patient have difficulty walking or climbing stairs?: No Does the patient have difficulty dressing or bathing?: No Does the patient have difficulty doing errands alone such as visiting a doctor's office or shopping?: No  Fall Risk  03/18/2018 03/02/2017 06/11/2016 06/24/2015 06/24/2015  Falls in the past year? No No No Yes No  Comment - - Emmi Telephone Survey: data to providers prior to load - -  Number falls in past yr: - - - 1 -  Injury with Fall? - - - No -     Providers she sees regularly:  Eye care provider: Dr Kathrin Penner, last exam 2 weeks ago. Nephrologist, Dr Dederding,last visit in 12/2017. Dentist,Dr Gloriann Loan.  Depression screen [facility] 2/9 03/18/2018  Decreased Interest 0  Down, Depressed, Hopeless 0  PHQ - 2 Score 0    Mini-Cog - 03/18/18 1217    Normal clock drawing test?  yes    How many words correct?  3       Visual Acuity Screening   Right eye Left eye Both eyes  Without correction:     With correction: 20/25 20/20 20/25        Review of Systems  Constitutional: Negative for activity change,  appetite change, fatigue and fever.  HENT: Negative for mouth sores, nosebleeds and trouble swallowing.   Eyes: Negative for redness and visual disturbance.  Respiratory: Negative for cough, shortness of breath and wheezing.   Cardiovascular: Negative for chest pain, palpitations and leg swelling.  Gastrointestinal: Negative for abdominal pain, nausea and vomiting.       Negative for changes in bowel habits.  Endocrine: Negative for cold intolerance.  Genitourinary: Negative for decreased urine volume, dysuria and hematuria.  Musculoskeletal: Positive for arthralgias. Negative for gait problem and myalgias.   Skin: Negative for rash and wound.  Neurological: Negative for syncope, weakness and headaches.     Current Outpatient Medications on File Prior to Visit  Medication Sig Dispense Refill  . acetaminophen (TYLENOL) 650 MG CR tablet Take 650 mg by mouth every 8 (eight) hours as needed for pain (pain).     Marland Kitchen allopurinol (ZYLOPRIM) 100 MG tablet TAKE 1 TABLET BY MOUTH TWICE DAILY 180 tablet 0  . amLODipine (NORVASC) 5 MG tablet TAKE 1 TABLET(5 MG) BY MOUTH DAILY 90 tablet 0  . aspirin 81 MG EC tablet Take 81 mg by mouth daily.      . calcitRIOL (ROCALTROL) 0.25 MCG capsule Take 0.25 mcg by mouth every other day.     . cetirizine (ZYRTEC) 10 MG tablet Take 10 mg by mouth daily.      . furosemide (LASIX) 40 MG tablet TAKE 1 TABLET BY MOUTH EVERY DAY 90 tablet 0  . levothyroxine (SYNTHROID, LEVOTHROID) 50 MCG tablet TAKE 1 TABLET BY MOUTH ONCE DAILY THEN 1/2 TABLET THE NEXT DAY ROTATING 90 tablet 0  . potassium chloride SA (K-DUR,KLOR-CON) 20 MEQ tablet TAKE 1 TABLET BY MOUTH EVERY DAY 90 tablet 1  . simvastatin (ZOCOR) 20 MG tablet TAKE 1 TABLET BY MOUTH EVERY NIGHT AT BEDTIME 90 tablet 0  . calcium carbonate (TUMS) 500 MG chewable tablet Chew 1 tablet by mouth daily.       No current facility-administered medications on file prior to visit.      Past Medical History:  Diagnosis Date  . Allergy   . Arthritis   . Broken foot    left  . Cancer ([place])    ovarian  . CKD (chronic kidney disease), stage III ([place])   . Hyperlipidemia   . Hypertension   . Obesity   . Thyroid disease    Allergies  Allergen Reactions  . Shellfish Allergy     Social History   Socioeconomic History  . Marital status: Married    Spouse name: Not on file  . Number of children: Not on file  . Years of education: Not on file  . Highest education level: Not on file  Occupational History  . Not on file  Social Needs  . Financial resource strain: Not on file  . Food insecurity:    Worry: Not on file     Inability: Not on file  . Transportation needs:    Medical: Not on file    Non-medical: Not on file  Tobacco Use  . Smoking status: Never Smoker  . Smokeless tobacco: Never Used  Substance and Sexual Activity  . Alcohol use: No  . Drug use: No  . Sexual activity: Never  Lifestyle  . Physical activity:    Days per week: Not on file    Minutes per session: Not on file  . Stress: Not on file  Relationships  . Social connections:    Talks on phone: Not  on file    Gets together: Not on file    Attends religious service: Not on file    Active member of club or organization: Not on file    Attends meetings of clubs or organizations: Not on file    Relationship status: Not on file  Other Topics Concern  . Not on file  Social History Narrative  . Not on file    Vitals:   03/18/18 1813  BP: 124/82  Pulse: 86  Resp: 16  Temp: 98.2 F (36.8 C)  SpO2: 97%   Body mass index is 35.19 kg/m.   Physical Exam  Nursing note and vitals reviewed. Constitutional: She is oriented to person, place, and time. She appears well-developed. No distress.  HENT:  Head: Normocephalic and atraumatic.  Mouth/Throat: Oropharynx is clear and moist and mucous membranes are normal.  Eyes: Pupils are equal, round, and reactive to light. Conjunctivae are normal.  Cardiovascular: Normal rate and regular rhythm.  No murmur heard. Pulses:      Dorsalis pedis pulses are 2+ on the right side, and 2+ on the left side.  Respiratory: Effort normal and breath sounds normal. No respiratory distress.  GI: Soft. She exhibits no mass. There is no hepatomegaly. There is no tenderness.  Musculoskeletal: She exhibits edema (Trace pitting LE edema,bilateral.).  Lymphadenopathy:    She has no cervical adenopathy.  Neurological: She is alert and oriented to person, place, and time. She has normal strength. No cranial nerve deficit. Gait (not assisted.) normal.  Skin: Skin is warm. No rash noted.  Mild erythema  periungual left great toe.  Tender with palpation,no drainage or local heat. Toenail yellowish,new toenail growing and pulling up old one (detached from nail bed)   Psychiatric: She has a normal mood and affect.  Well groomed, good eye contact.     ASSESSMENT AND PLAN:   [name] was seen today for 11 months follow-up and Medicare preventive visit.  Orders Placed This Encounter  Procedures  . Flu vaccine HIGH DOSE PF  . TSH  . Lipid panel  . Hepatic function panel    Lab Results  Component Value Date   TSH 2.73 03/18/2018   Lab Results  Component Value Date   CHOL 183 03/18/2018   HDL 68.30 03/18/2018   LDLCALC 89 03/18/2018   LDLDIRECT 91.4 02/03/2010   TRIG 125.0 03/18/2018   CHOLHDL 3 03/18/2018   Lab Results  Component Value Date   ALT 16 03/18/2018   AST 20 03/18/2018   ALKPHOS 82 03/18/2018   BILITOT 0.8 03/18/2018     Medicare annual wellness visit, subsequent  We discussed the importance of staying active, physically and mentally, as well as the benefits of a healthy/balance diet. Low impact exercise that involve stretching and strengthing are ideal. Vaccines up to date,influenza vaccine given today.  We discussed preventive screening for the next 5-10 years, summery of recommendations given in AVS:  Periodic eye exam and glaucoma screening. Fall prevention.  Advance directives and end of life discussed, she has POA and living will.    Hypothyroidism, unspecified type  No changes in current management, will follow labs done today and will give further recommendations accordingly. F/U annually.  -     TSH -     Hepatic function panel  Hyperlipidemia, unspecified hyperlipidemia type  No changes in current management, will follow labs done today and will give further recommendations accordingly. Tolerating Zocor well, has not had side effects or signs  of interaction with Amlodipine.  -     Lipid panel -     Hepatic function  panel  Essential hypertension  Adequately controlled. No changes in current management. DASH and low salt diet to continue. Eye exam recommended annually. F/U in 12 months, before if needed.  CKD (chronic kidney disease) stage 3, GFR 30-59 ml/min (HCC)  It has been stable. Not longer on Losartan. Continue following with Dr Dederding q 3-4 months.  Paronychia of great toe of right foot  Mild. Possible etiologies discussed. Will treat as bacterial with Keflex. Side effects of abx discussed. Recommend applying vick vapor rub at night on toenail.  -     cephALEXin (KEFLEX) 500 MG capsule; Take 1 capsule (500 mg total) by mouth 2 (two) times daily for 7 days.  Encounter for immunization -     Flu vaccine HIGH DOSE PF   Other specified nonscarring hair loss  Possible etiology discussed. OTC Rogaine may help.      Shawnette Augello G. Martin[place], MD  [facility]. [place] office.

## 2018-03-18 NOTE — Patient Instructions (Addendum)
A few things to remember from today's visit:   Hypothyroidism, unspecified type - Plan: TSH, Hepatic function panel  Hyperlipidemia, unspecified hyperlipidemia type - Plan: Lipid panel, Hepatic function panel  Essential hypertension  CKD (chronic kidney disease) stage 3, GFR 30-59 ml/min (HCC)  Paronychia of great toe of right foot - Plan: cephALEXin (KEFLEX) 500 MG capsule   A few tips:  -As we age balance is not as good as it was, so there is a higher risks for falls. Please remove small rugs and furniture that is "in your way" and could increase the risk of falls. Stretching exercises may help with fall prevention: Yoga and Tai Chi are some examples. Low impact exercise is better, so you are not very achy the next day.  -Sun screen and avoidance of direct sun light recommended. Caution with dehydration, if working outdoors be sure to drink enough fluids.  - Some medications are not safe as we age, increases the risk of side effects and can potentially interact with other medication you are also taken;  including some of over the counter medications. Be sure to let me know when you start a new medication even if it is a dietary/vitamin supplement.   -Healthy diet low in red meet/animal fat and sugar + regular physical activity is recommended.       Screening schedule for the next 5-10 years:   Glaucoma screening/eye exam every 1 year.  Mammogram for breast cancer screening annually.  Flu vaccine annually.  Diabetes screening annually.  Fall prevention   Advance directives:  Please see a lawyer and/or go to this website to help you with advanced directives and designating a health care power of attorney so that your wishes will be followed should you become too ill to make your own medical decisions.  http://www.ncdhhs.gov/aging/direct.htm       Please be sure medication list is accurate. If a new problem present, please set up appointment sooner than planned  today.         

## 2018-03-20 ENCOUNTER — Encounter: Payer: Self-pay | Admitting: Family Medicine

## 2018-03-20 MED ORDER — LEVOTHYROXINE SODIUM 50 MCG PO TABS: ORAL_TABLET | ORAL | 3 refills | Status: DC

## 2018-04-14 ENCOUNTER — Other Ambulatory Visit: Payer: Self-pay | Admitting: Family Medicine

## 2018-04-27 DIAGNOSIS — I129 Hypertensive chronic kidney disease with stage 1 through stage 4 chronic kidney disease, or unspecified chronic kidney disease: Secondary | ICD-10-CM | POA: Diagnosis not present

## 2018-04-27 DIAGNOSIS — N183 Chronic kidney disease, stage 3 (moderate): Secondary | ICD-10-CM | POA: Diagnosis not present

## 2018-04-27 DIAGNOSIS — N2581 Secondary hyperparathyroidism of renal origin: Secondary | ICD-10-CM | POA: Diagnosis not present

## 2018-04-27 DIAGNOSIS — D631 Anemia in chronic kidney disease: Secondary | ICD-10-CM | POA: Diagnosis not present

## 2018-05-14 ENCOUNTER — Other Ambulatory Visit: Payer: Self-pay | Admitting: Family Medicine

## 2018-05-14 DIAGNOSIS — I10 Essential (primary) hypertension: Secondary | ICD-10-CM

## 2018-05-14 DIAGNOSIS — E785 Hyperlipidemia, unspecified: Secondary | ICD-10-CM

## 2018-06-13 ENCOUNTER — Other Ambulatory Visit: Payer: Self-pay | Admitting: Family Medicine

## 2018-07-13 ENCOUNTER — Other Ambulatory Visit: Payer: Self-pay | Admitting: Family Medicine

## 2018-08-16 ENCOUNTER — Other Ambulatory Visit: Payer: Self-pay | Admitting: Family Medicine

## 2018-08-16 DIAGNOSIS — E785 Hyperlipidemia, unspecified: Secondary | ICD-10-CM

## 2018-08-16 DIAGNOSIS — I10 Essential (primary) hypertension: Secondary | ICD-10-CM

## 2018-09-02 ENCOUNTER — Other Ambulatory Visit: Payer: Self-pay | Admitting: Family Medicine

## 2018-09-02 DIAGNOSIS — Z1231 Encounter for screening mammogram for malignant neoplasm of breast: Secondary | ICD-10-CM

## 2018-09-05 ENCOUNTER — Other Ambulatory Visit: Payer: Self-pay | Admitting: Family Medicine

## 2018-09-28 ENCOUNTER — Ambulatory Visit: Payer: Medicare Other

## 2018-10-01 ENCOUNTER — Other Ambulatory Visit: Payer: Self-pay | Admitting: Family Medicine

## 2018-10-26 ENCOUNTER — Ambulatory Visit: Payer: Medicare Other

## 2018-10-27 ENCOUNTER — Other Ambulatory Visit: Payer: Self-pay | Admitting: Family Medicine

## 2018-10-27 DIAGNOSIS — E785 Hyperlipidemia, unspecified: Secondary | ICD-10-CM

## 2018-10-27 DIAGNOSIS — I10 Essential (primary) hypertension: Secondary | ICD-10-CM

## 2018-10-28 DIAGNOSIS — M109 Gout, unspecified: Secondary | ICD-10-CM | POA: Diagnosis not present

## 2018-10-28 DIAGNOSIS — N183 Chronic kidney disease, stage 3 (moderate): Secondary | ICD-10-CM | POA: Diagnosis not present

## 2018-10-28 DIAGNOSIS — E782 Mixed hyperlipidemia: Secondary | ICD-10-CM | POA: Diagnosis not present

## 2018-10-28 DIAGNOSIS — D631 Anemia in chronic kidney disease: Secondary | ICD-10-CM | POA: Diagnosis not present

## 2018-10-28 DIAGNOSIS — I129 Hypertensive chronic kidney disease with stage 1 through stage 4 chronic kidney disease, or unspecified chronic kidney disease: Secondary | ICD-10-CM | POA: Diagnosis not present

## 2018-10-28 DIAGNOSIS — N390 Urinary tract infection, site not specified: Secondary | ICD-10-CM | POA: Diagnosis not present

## 2018-10-28 DIAGNOSIS — Z0000 Encounter for general adult medical examination without abnormal findings: Secondary | ICD-10-CM | POA: Diagnosis not present

## 2018-10-28 DIAGNOSIS — Z87440 Personal history of urinary (tract) infections: Secondary | ICD-10-CM | POA: Diagnosis not present

## 2018-10-28 DIAGNOSIS — M1990 Unspecified osteoarthritis, unspecified site: Secondary | ICD-10-CM | POA: Diagnosis not present

## 2018-10-28 DIAGNOSIS — N2581 Secondary hyperparathyroidism of renal origin: Secondary | ICD-10-CM | POA: Diagnosis not present

## 2018-10-28 DIAGNOSIS — E038 Other specified hypothyroidism: Secondary | ICD-10-CM | POA: Diagnosis not present

## 2018-10-28 DIAGNOSIS — R809 Proteinuria, unspecified: Secondary | ICD-10-CM | POA: Diagnosis not present

## 2018-10-28 DIAGNOSIS — N184 Chronic kidney disease, stage 4 (severe): Secondary | ICD-10-CM | POA: Diagnosis not present

## 2018-11-22 DIAGNOSIS — N390 Urinary tract infection, site not specified: Secondary | ICD-10-CM | POA: Diagnosis not present

## 2018-11-27 ENCOUNTER — Other Ambulatory Visit: Payer: Self-pay | Admitting: Family Medicine

## 2018-12-23 ENCOUNTER — Other Ambulatory Visit: Payer: Self-pay | Admitting: Family Medicine

## 2018-12-26 ENCOUNTER — Ambulatory Visit
Admission: RE | Admit: 2018-12-26 | Discharge: 2018-12-26 | Disposition: A | Discharge status: Home / Self Care | Payer: Medicare Other | Source: Ambulatory Visit | Attending: Family Medicine | Admitting: Family Medicine

## 2018-12-26 DIAGNOSIS — Z1231 Encounter for screening mammogram for malignant neoplasm of breast: Secondary | ICD-10-CM

## 2019-01-13 ENCOUNTER — Other Ambulatory Visit: Payer: Self-pay | Admitting: Family Medicine

## 2019-01-13 DIAGNOSIS — E785 Hyperlipidemia, unspecified: Secondary | ICD-10-CM

## 2019-01-13 DIAGNOSIS — I10 Essential (primary) hypertension: Secondary | ICD-10-CM

## 2019-02-22 DIAGNOSIS — E782 Mixed hyperlipidemia: Secondary | ICD-10-CM | POA: Diagnosis not present

## 2019-02-22 DIAGNOSIS — N189 Chronic kidney disease, unspecified: Secondary | ICD-10-CM | POA: Diagnosis not present

## 2019-02-22 DIAGNOSIS — Z87440 Personal history of urinary (tract) infections: Secondary | ICD-10-CM | POA: Diagnosis not present

## 2019-02-22 DIAGNOSIS — Z8543 Personal history of malignant neoplasm of ovary: Secondary | ICD-10-CM | POA: Diagnosis not present

## 2019-02-22 DIAGNOSIS — N183 Chronic kidney disease, stage 3 (moderate): Secondary | ICD-10-CM | POA: Diagnosis not present

## 2019-02-22 DIAGNOSIS — N390 Urinary tract infection, site not specified: Secondary | ICD-10-CM | POA: Diagnosis not present

## 2019-02-22 DIAGNOSIS — M109 Gout, unspecified: Secondary | ICD-10-CM | POA: Diagnosis not present

## 2019-02-22 DIAGNOSIS — D631 Anemia in chronic kidney disease: Secondary | ICD-10-CM | POA: Diagnosis not present

## 2019-02-22 DIAGNOSIS — N2581 Secondary hyperparathyroidism of renal origin: Secondary | ICD-10-CM | POA: Diagnosis not present

## 2019-02-22 DIAGNOSIS — I129 Hypertensive chronic kidney disease with stage 1 through stage 4 chronic kidney disease, or unspecified chronic kidney disease: Secondary | ICD-10-CM | POA: Diagnosis not present

## 2019-02-23 ENCOUNTER — Other Ambulatory Visit: Payer: Self-pay | Admitting: Family Medicine

## 2019-02-23 DIAGNOSIS — E785 Hyperlipidemia, unspecified: Secondary | ICD-10-CM

## 2019-03-02 DIAGNOSIS — H353121 Nonexudative age-related macular degeneration, left eye, early dry stage: Secondary | ICD-10-CM | POA: Diagnosis not present

## 2019-03-02 DIAGNOSIS — E119 Type 2 diabetes mellitus without complications: Secondary | ICD-10-CM | POA: Diagnosis not present

## 2019-03-02 DIAGNOSIS — H43813 Vitreous degeneration, bilateral: Secondary | ICD-10-CM | POA: Diagnosis not present

## 2019-03-02 DIAGNOSIS — H5213 Myopia, bilateral: Secondary | ICD-10-CM | POA: Diagnosis not present

## 2019-03-02 LAB — HM DIABETES EYE EXAM

## 2019-03-03 ENCOUNTER — Encounter: Payer: Self-pay | Admitting: Family Medicine

## 2019-03-24 ENCOUNTER — Other Ambulatory Visit: Payer: Self-pay

## 2019-03-24 ENCOUNTER — Ambulatory Visit (INDEPENDENT_AMBULATORY_CARE_PROVIDER_SITE_OTHER): Payer: Medicare Other | Admitting: Family Medicine

## 2019-03-24 ENCOUNTER — Encounter: Payer: Self-pay | Admitting: Family Medicine

## 2019-03-24 VITALS — BP 120/72 | HR 93 | Temp 98.5°F | Resp 12 | Ht 62.0 in | Wt 191.4 lb

## 2019-03-24 DIAGNOSIS — R300 Dysuria: Secondary | ICD-10-CM

## 2019-03-24 DIAGNOSIS — N183 Chronic kidney disease, stage 3 unspecified: Secondary | ICD-10-CM

## 2019-03-24 DIAGNOSIS — Z0000 Encounter for general adult medical examination without abnormal findings: Secondary | ICD-10-CM

## 2019-03-24 DIAGNOSIS — E039 Hypothyroidism, unspecified: Secondary | ICD-10-CM | POA: Diagnosis not present

## 2019-03-24 DIAGNOSIS — E2089 Other specified hypoparathyroidism: Secondary | ICD-10-CM | POA: Insufficient documentation

## 2019-03-24 DIAGNOSIS — M109 Gout, unspecified: Secondary | ICD-10-CM

## 2019-03-24 DIAGNOSIS — Z23 Encounter for immunization: Secondary | ICD-10-CM | POA: Diagnosis not present

## 2019-03-24 DIAGNOSIS — E208 Other hypoparathyroidism: Secondary | ICD-10-CM | POA: Diagnosis not present

## 2019-03-24 DIAGNOSIS — E876 Hypokalemia: Secondary | ICD-10-CM

## 2019-03-24 DIAGNOSIS — I10 Essential (primary) hypertension: Secondary | ICD-10-CM

## 2019-03-24 DIAGNOSIS — E785 Hyperlipidemia, unspecified: Secondary | ICD-10-CM | POA: Diagnosis not present

## 2019-03-24 DIAGNOSIS — R7309 Other abnormal glucose: Secondary | ICD-10-CM

## 2019-03-24 DIAGNOSIS — R7303 Prediabetes: Secondary | ICD-10-CM

## 2019-03-24 LAB — POC URINALSYSI DIPSTICK (AUTOMATED)
Bilirubin, UA: NEGATIVE
Blood, UA: NEGATIVE
Glucose, UA: NEGATIVE
Ketones, UA: NEGATIVE
Leukocytes, UA: NEGATIVE
Nitrite, UA: NEGATIVE
Protein, UA: POSITIVE — AB
Spec Grav, UA: 1.015 (ref 1.010–1.025)
Urobilinogen, UA: 0.2 E.U./dL
pH, UA: 6 (ref 5.0–8.0)

## 2019-03-24 LAB — LIPID PANEL
Cholesterol: 179 mg/dL (ref 0–200)
HDL: 60.5 mg/dL (ref >39.00)
LDL Cholesterol: 91 mg/dL (ref 0–99)
NonHDL: 118.57
Total CHOL/HDL Ratio: 3
Triglycerides: 136 mg/dL (ref 0.0–149.0)
VLDL: 27.2 mg/dL (ref 0.0–40.0)

## 2019-03-24 LAB — HEPATIC FUNCTION PANEL
ALT: 12 U/L (ref 0–35)
AST: 18 U/L (ref 0–37)
Albumin: 4.6 g/dL (ref 3.5–5.2)
Alkaline Phosphatase: 82 U/L (ref 39–117)
Bilirubin, Direct: 0.1 mg/dL (ref 0.0–0.3)
Total Bilirubin: 0.8 mg/dL (ref 0.2–1.2)
Total Protein: 7.1 g/dL (ref 6.0–8.3)

## 2019-03-24 LAB — TSH: TSH: 3.03 u[IU]/mL (ref 0.35–4.50)

## 2019-03-24 MED ORDER — AMLODIPINE BESYLATE 5 MG PO TABS: ORAL_TABLET | ORAL | 3 refills | Status: DC

## 2019-03-24 MED ORDER — CALCITRIOL 0.25 MCG PO CAPS: 0.2500 ug | ORAL_CAPSULE | ORAL | 1 refills | Status: DC

## 2019-03-24 MED ORDER — FUROSEMIDE 40 MG PO TABS: 40.0000 mg | ORAL_TABLET | Freq: Every day | ORAL | 1 refills | Status: DC

## 2019-03-24 MED ORDER — LEVOTHYROXINE SODIUM 50 MCG PO TABS: ORAL_TABLET | ORAL | 3 refills | Status: DC

## 2019-03-24 MED ORDER — ALLOPURINOL 100 MG PO TABS: 100.0000 mg | ORAL_TABLET | Freq: Two times a day (BID) | ORAL | 1 refills | Status: DC

## 2019-03-24 MED ORDER — POTASSIUM CHLORIDE CRYS ER 20 MEQ PO TBCR: 20.0000 meq | EXTENDED_RELEASE_TABLET | Freq: Every day | ORAL | 1 refills | Status: DC

## 2019-03-24 NOTE — Progress Notes (Signed)
HPI:   [name] is a 85 y.o. female, who is here today for chronic disease management.  She was last seen on 03/18/18. Since her last OV she has seen nephrologist, Dr Jimmy Footman.  Hypothyroidism: Currently she is on Levothyroxine 50 mcg alternating between 1 and 1/2 tab daily,rotating.. Tolerating medication well, no side effects reported. She  has not noted dysphagia, palpitations, abdominal pain, changes in bowel habits, tremor, cold/heat intolerance, or abnormal weight loss.  Lab Results  Component Value Date   TSH 2.73 03/18/2018   HTN: Currently she is on Amlodipine 5 mg daily. Denies severe/frequent headache, visual changes, chest pain, dyspnea, palpitation, claudication, focal weakness, or edema. CKD III-IV and secondary hypothyroidism.  Lab Results  Component Value Date   CREATININE 1.42 (H) 04/07/2017   BUN 26 (H) 04/07/2017   NA 138 04/07/2017   K 4.1 04/07/2017   CL 99 04/07/2017   CO2 25 04/07/2017   HLD: She is on Simvastatin 20 mg daily. She is following low fat diet.  Lab Results  Component Value Date   CHOL 183 03/18/2018   HDL 68.30 03/18/2018   LDLCALC 89 03/18/2018   LDLDIRECT 91.4 02/03/2010   TRIG 125.0 03/18/2018   CHOLHDL 3 03/18/2018   She has tolerated medications well,no side effects reported.  Recently treated for UTI by her nephrologist. Wilburn Mylar she started with "little burning" sensation with urination. Nocturia x 1.  No changes in urinary frequency,gross hematuria,decreased urine output,urgency,incontinence,or abdominal pain. She has refills on Cephalexin but wants to be sure she is having a UTI.  Denies fever,chills,fatigue,or body aches. She has not tried OTC medications.  Requesting refills for all her meds. Her nephrologist is retiring and she is afraid of running out of medications before she gets to see new provider.  Gout arthritis,she is on Allopurinol 200 mg daily, she has not had a gout attack in  over a year.  AWV She lives alone. She is walking some around the house, she is planning going back to the gym when she feels safe to do so.  Independent ADL's and IADL's. No falls in the past year and denies depression symptoms.  Functional Status Survey: Is the patient deaf or have difficulty hearing?: No Does the patient have difficulty seeing, even when wearing glasses/contacts?: No Does the patient have difficulty concentrating, remembering, or making decisions?: No Does the patient have difficulty walking or climbing stairs?: No Does the patient have difficulty dressing or bathing?: No Does the patient have difficulty doing errands alone such as visiting a doctor's office or shopping?: No  Fall Risk  03/24/2019 03/18/2018 03/02/2017 06/11/2016 06/24/2015  Falls in the past year? 0 No No No Yes  Comment - - - Emmi Telephone Survey: data to providers prior to load -  Number falls in past yr: 0 - - - 1  Injury with Fall? 0 - - - No  Follow up Education provided - - - -   Providers she sees regularly: Eye care provider: Dr Mylo Red Nephrologist: Dr Deterdings.  Depression screen [facility] 2/9 03/24/2019  Decreased Interest 0  Down, Depressed, Hopeless 0  PHQ - 2 Score 0   Mini-Cog - 03/24/19 1426    Normal clock drawing test?  yes    How many words correct?  3       Hearing Screening   125Hz  250Hz  500Hz  1000Hz  2000Hz  3000Hz  4000Hz  6000Hz  8000Hz   Right ear:  Left ear:             Visual Acuity Screening   Right eye Left eye Both eyes  Without correction:     With correction: 20/30 20/30 20/25     Review of Systems  Constitutional: Negative for activity change, appetite change and fatigue.  HENT: Negative for mouth sores, nosebleeds and trouble swallowing.   Eyes: Negative for pain and redness.  Respiratory: Negative for cough, shortness of breath and wheezing.   Cardiovascular: Negative for chest pain, palpitations and leg swelling.  Gastrointestinal: Negative  for abdominal pain, nausea and vomiting.       Negative for changes in bowel habits.  Genitourinary: Negative for difficulty urinating and pelvic pain.  Musculoskeletal: Positive for arthralgias. Negative for myalgias.  Skin: Negative for rash and wound.  Neurological: Negative for dizziness, syncope and facial asymmetry.  Rest see pertinent positives and negatives per HPI.   Current Outpatient Medications on File Prior to Visit  Medication Sig Dispense Refill  . acetaminophen (TYLENOL) 650 MG CR tablet Take 650 mg by mouth every 8 (eight) hours as needed for pain (pain).     Marland Kitchen aspirin 81 MG EC tablet Take 81 mg by mouth daily.      . calcium carbonate (TUMS) 500 MG chewable tablet Chew 1 tablet by mouth daily.      . cephALEXin (KEFLEX) 500 MG capsule TK 1 C PO BID    . cetirizine (ZYRTEC) 10 MG tablet Take 10 mg by mouth daily.      . simvastatin (ZOCOR) 20 MG tablet TAKE 1 TABLET BY MOUTH EVERY NIGHT AT BEDTIME 90 tablet 0   No current facility-administered medications on file prior to visit.     Past Medical History:  Diagnosis Date  . Allergy   . Arthritis   . Broken foot    left  . Cancer ([place])    ovarian  . CKD (chronic kidney disease), stage III ([place])   . Hyperlipidemia   . Hypertension   . Obesity   . Thyroid disease    Allergies  Allergen Reactions  . Shellfish Allergy     Social History   Socioeconomic History  . Marital status: Married    Spouse name: Not on file  . Number of children: Not on file  . Years of education: Not on file  . Highest education level: Not on file  Occupational History  . Not on file  Social Needs  . Financial resource strain: Not on file  . Food insecurity    Worry: Not on file    Inability: Not on file  . Transportation needs    Medical: Not on file    Non-medical: Not on file  Tobacco Use  . Smoking status: Never Smoker  . Smokeless tobacco: Never Used  Substance and Sexual Activity  . Alcohol use: No  . Drug use:  No  . Sexual activity: Never  Lifestyle  . Physical activity    Days per week: Not on file    Minutes per session: Not on file  . Stress: Not on file  Relationships  . Social Herbalist on phone: Not on file    Gets together: Not on file    Attends religious service: Not on file    Active member of club or organization: Not on file    Attends meetings of clubs or organizations: Not on file    Relationship status: Not on file  Other Topics Concern  .  Not on file  Social History Narrative  . Not on file    Vitals:   03/24/19 1137  BP: 120/72  Pulse: 93  Resp: 12  Temp: 98.5 F (36.9 C)  SpO2: 95%   Body mass index is 35 kg/m.   Physical Exam  Nursing note and vitals reviewed. Constitutional: She is oriented to person, place, and time. She appears well-developed. No distress.  HENT:  Head: Normocephalic and atraumatic.  Mouth/Throat: Oropharynx is clear and moist and mucous membranes are normal.  Eyes: Pupils are equal, round, and reactive to light. Conjunctivae are normal.  Cardiovascular: Normal rate and regular rhythm.  No murmur heard. Pulses:      Dorsalis pedis pulses are 2+ on the right side and 2+ on the left side.  Respiratory: Effort normal and breath sounds normal. No respiratory distress.  GI: Soft. She exhibits no mass. There is no hepatomegaly. There is no abdominal tenderness.  Musculoskeletal:        General: Edema (Trace pitting edema, bilateral) present.  Lymphadenopathy:    She has no cervical adenopathy.  Neurological: She is alert and oriented to person, place, and time. She has normal strength. No cranial nerve deficit.  Stable gait, not assistance needed.  Skin: Skin is warm. No rash noted. No erythema.  Psychiatric: She has a normal mood and affect.  Well groomed, good eye contact.    ASSESSMENT AND PLAN:  Ms. Jolynne was seen today for medicare wellness and follow-up.  Diagnoses and all orders for this visit:  Lab Results   Component Value Date   ALT 12 03/24/2019   AST 18 03/24/2019   ALKPHOS 82 03/24/2019   BILITOT 0.8 03/24/2019   Lab Results  Component Value Date   CHOL 179 03/24/2019   HDL 60.50 03/24/2019   LDLCALC 91 03/24/2019   LDLDIRECT 91.4 02/03/2010   TRIG 136.0 03/24/2019   CHOLHDL 3 03/24/2019   Lab Results  Component Value Date   TSH 3.03 03/24/2019     Essential hypertension BP adequately controlled. No changes in current management. Some side effects of med discussed as well as risk of interaction with Simvastatin. Recommend monitoring BP regularly and continue low salt diet.  -     amLODipine (NORVASC) 5 MG tablet; TAKE 1 TABLET(5 MG) BY MOUTH DAILY  Hyperlipidemia, unspecified hyperlipidemia type Continue Simvastatin 20 mg daily and low fat diet.  -     Hepatic function panel -     Lipid panel  Hypothyroidism, unspecified type No changes in current management, will follow TSH  done today and will give further recommendations accordingly.  -     levothyroxine (SYNTHROID) 50 MCG tablet; TAKE 1 TABLET BY MOUTH ONCE DAILY THEN 1/2 TABLET THE NEXT DAY ROTATING -     TSH  CKD (chronic kidney disease) stage 3, GFR 30-59 ml/min (HCC) Following with nephrologist. Adequate hydration,low salt diet,and avoidance of nephrotoxic medications.'  -     calcitRIOL (ROCALTROL) 0.25 MCG capsule; Take 1 capsule (0.25 mcg total) by mouth every other day.  Gouty arthropathy Asymptomatic. No changes in current management.  -     allopurinol (ZYLOPRIM) 100 MG tablet; Take 1 tablet (100 mg total) by mouth 2 (two) times daily.  Need for influenza vaccination -     Flu Vaccine QUAD High Dose(Fluad)  Hypokalemia K+ in 10/28/18 K+ was 3.3. For now no changes in current management, followed by nephrologist.  -     potassium chloride SA (K-DUR) 20 MEQ  tablet; Take 1 tablet (20 mEq total) by mouth daily.  Medicare annual wellness visit, subsequent We discussed the importance of staying  active, physically and mentally, as well as the benefits of a healthy/balance diet. Low impact exercise that involve stretching and strengthing are ideal. Vaccines up to date. We discussed preventive screening for the next 5-10 years, summery of recommendations given in AVS. Fall prevention.  Advance directives and end of life discussed, she has POA and living    Dysuria Urine dipstick otherwise negative except for protein. Urine sent for culture. For now recommend monitoring for new symptoms and instructed about warning signs. Further recommendations will be given according to Ucx results.  -     POCT Urinalysis Dipstick (Automated) -     Culture, Urine  Secondary hypoparathyroidism ([place]) Continue following with nephrologist.  -     calcitRIOL (ROCALTROL) 0.25 MCG capsule; Take 1 capsule (0.25 mcg total) by mouth every other day.  Other orders -     furosemide (LASIX) 40 MG tablet; Take 1 tablet (40 mg total) by mouth daily.  She follows with nephrologist every 4 months,so I will see her in a year,before if needed. Reviewing blood work at her nephrologist's office glucose elevated at 143. We will arrange for HgA1C.   Return in about 1 year (around 03/23/2020) for f/u.    Jenesys Casseus G. Martinique, MD  [facility]. [place] office.

## 2019-03-24 NOTE — Patient Instructions (Addendum)
  [name] , Thank you for taking time to come for your Medicare Wellness Visit. I appreciate your ongoing commitment to your health goals. Please review the following plan we discussed and let me know if I can assist you in the future.   These are the goals we discussed: Goals    . patient     Between reading will move more;  The plan is to stretch, walk or strength train every chapter.   Will exercise 20 minutes every day / can be intermittent      . patient     Stay active !       This is a list of the screening recommended for you and due dates:  Health Maintenance  Topic Date Due  . Urine Protein Check  03/02/2013  . Tetanus Vaccine  03/03/2027  . Flu Shot  Completed  . DEXA scan (bone density measurement)  Completed  . Pneumonia vaccines  Completed   A few things to remember from today's visit:   Essential hypertension - Plan: amLODipine (NORVASC) 5 MG tablet  Hyperlipidemia, unspecified hyperlipidemia type  Hypothyroidism, unspecified type  CKD (chronic kidney disease) stage 3, GFR 30-59 ml/min (HCC)  Gouty arthropathy  Need for influenza vaccination - Plan: Flu Vaccine QUAD High Dose(Fluad)  Hypokalemia - Plan: potassium chloride SA (K-DUR) 20 MEQ tablet  A few tips:  -As we age balance is not as good as it was, so there is a higher risks for falls. Please remove small rugs and furniture that is "in your way" and could increase the risk of falls. Stretching exercises may help with fall prevention: Yoga and Tai Chi are some examples. Low impact exercise is better, so you are not very achy the next day.  -Sun screen and avoidance of direct sun light recommended. Caution with dehydration, if working outdoors be sure to drink enough fluids.  - Some medications are not safe as we age, increases the risk of side effects and can potentially interact with other medication you are also taken;  including some of over the counter medications. Be sure to let me know  when you start a new medication even if it is a dietary/vitamin supplement.   -Healthy diet low in red meet/animal fat and sugar + regular physical activity is recommended.      Screening schedule for the next 5-10 years: Glaucoma screening/eye exam periodically. Mammogram for breast cancer screening every 1-2 years  Flu vaccine annually. Fall prevention

## 2019-03-26 LAB — URINE CULTURE
MICRO NUMBER:: 898039
SPECIMEN QUALITY:: ADEQUATE

## 2019-03-27 ENCOUNTER — Other Ambulatory Visit: Payer: Self-pay | Admitting: Family Medicine

## 2019-03-27 MED ORDER — SULFAMETHOXAZOLE-TRIMETHOPRIM 800-160 MG PO TABS: 1.0000 | ORAL_TABLET | Freq: Two times a day (BID) | ORAL | 0 refills | Status: AC

## 2019-03-28 ENCOUNTER — Other Ambulatory Visit: Payer: Self-pay

## 2019-03-28 ENCOUNTER — Other Ambulatory Visit (INDEPENDENT_AMBULATORY_CARE_PROVIDER_SITE_OTHER): Payer: Medicare Other

## 2019-03-28 DIAGNOSIS — R7303 Prediabetes: Secondary | ICD-10-CM

## 2019-03-28 DIAGNOSIS — E876 Hypokalemia: Secondary | ICD-10-CM

## 2019-03-28 LAB — POTASSIUM: Potassium: 3.8 mEq/L (ref 3.5–5.1)

## 2019-03-28 LAB — HEMOGLOBIN A1C: Hgb A1c MFr Bld: 6 % (ref 4.6–6.5)

## 2019-05-28 ENCOUNTER — Other Ambulatory Visit: Payer: Self-pay | Admitting: Family Medicine

## 2019-05-28 DIAGNOSIS — E785 Hyperlipidemia, unspecified: Secondary | ICD-10-CM

## 2019-07-12 DIAGNOSIS — N1832 Chronic kidney disease, stage 3b: Secondary | ICD-10-CM | POA: Diagnosis not present

## 2019-07-12 DIAGNOSIS — D631 Anemia in chronic kidney disease: Secondary | ICD-10-CM | POA: Diagnosis not present

## 2019-07-12 DIAGNOSIS — N2581 Secondary hyperparathyroidism of renal origin: Secondary | ICD-10-CM | POA: Diagnosis not present

## 2019-07-12 DIAGNOSIS — N390 Urinary tract infection, site not specified: Secondary | ICD-10-CM | POA: Diagnosis not present

## 2019-07-12 DIAGNOSIS — N189 Chronic kidney disease, unspecified: Secondary | ICD-10-CM | POA: Diagnosis not present

## 2019-07-12 DIAGNOSIS — I129 Hypertensive chronic kidney disease with stage 1 through stage 4 chronic kidney disease, or unspecified chronic kidney disease: Secondary | ICD-10-CM | POA: Diagnosis not present

## 2019-08-20 ENCOUNTER — Other Ambulatory Visit: Payer: Self-pay | Admitting: Family Medicine

## 2019-08-20 DIAGNOSIS — E785 Hyperlipidemia, unspecified: Secondary | ICD-10-CM

## 2019-09-20 ENCOUNTER — Other Ambulatory Visit: Payer: Self-pay | Admitting: Family Medicine

## 2019-09-20 DIAGNOSIS — E876 Hypokalemia: Secondary | ICD-10-CM

## 2019-11-08 DIAGNOSIS — D631 Anemia in chronic kidney disease: Secondary | ICD-10-CM | POA: Diagnosis not present

## 2019-11-08 DIAGNOSIS — N2581 Secondary hyperparathyroidism of renal origin: Secondary | ICD-10-CM | POA: Diagnosis not present

## 2019-11-08 DIAGNOSIS — N189 Chronic kidney disease, unspecified: Secondary | ICD-10-CM | POA: Diagnosis not present

## 2019-11-08 DIAGNOSIS — Z87440 Personal history of urinary (tract) infections: Secondary | ICD-10-CM | POA: Diagnosis not present

## 2019-11-08 DIAGNOSIS — N1832 Chronic kidney disease, stage 3b: Secondary | ICD-10-CM | POA: Diagnosis not present

## 2019-11-08 DIAGNOSIS — I129 Hypertensive chronic kidney disease with stage 1 through stage 4 chronic kidney disease, or unspecified chronic kidney disease: Secondary | ICD-10-CM | POA: Diagnosis not present

## 2019-11-10 DIAGNOSIS — N390 Urinary tract infection, site not specified: Secondary | ICD-10-CM | POA: Diagnosis not present

## 2019-11-15 ENCOUNTER — Other Ambulatory Visit: Payer: Self-pay | Admitting: Family Medicine

## 2019-11-15 DIAGNOSIS — M109 Gout, unspecified: Secondary | ICD-10-CM

## 2019-11-15 DIAGNOSIS — E785 Hyperlipidemia, unspecified: Secondary | ICD-10-CM

## 2019-12-11 ENCOUNTER — Telehealth: Payer: Self-pay | Admitting: Family Medicine

## 2019-12-11 NOTE — Progress Notes (Signed)
  Chronic Care Management   Note  12/11/2019 Name: [name] MRN: [id number] DOB: 1934/05/22  [name] is a 86 y.o. year old female who is a primary care patient of Martinique, Malka So, MD. I reached out to [place] by phone today in response to a referral sent by [name] PCP, Martinique, Betty G, MD.   [name] was given information about Chronic Care Management services today including:  1. CCM service includes personalized support from designated clinical staff supervised by her physician, including individualized plan of care and coordination with other care providers 2. 24/7 contact phone numbers for assistance for urgent and routine care needs. 3. Service will only be billed when office clinical staff spend 20 minutes or more in a month to coordinate care. 4. Only one practitioner may furnish and bill the service in a calendar month. 5. The patient may stop CCM services at any time (effective at the end of the month) by phone call to the office staff.   Patient agreed to services and verbal consent obtained.   Follow up plan:  [place]

## 2019-12-28 ENCOUNTER — Other Ambulatory Visit: Payer: Self-pay | Admitting: Family Medicine

## 2019-12-28 DIAGNOSIS — Z1231 Encounter for screening mammogram for malignant neoplasm of breast: Secondary | ICD-10-CM

## 2020-01-11 ENCOUNTER — Other Ambulatory Visit: Payer: Self-pay

## 2020-01-11 ENCOUNTER — Ambulatory Visit
Admission: RE | Admit: 2020-01-11 | Discharge: 2020-01-11 | Disposition: A | Discharge status: Home / Self Care | Payer: Medicare Other | Source: Ambulatory Visit | Attending: Family Medicine | Admitting: Family Medicine

## 2020-01-11 DIAGNOSIS — Z1231 Encounter for screening mammogram for malignant neoplasm of breast: Secondary | ICD-10-CM

## 2020-02-01 ENCOUNTER — Ambulatory Visit: Payer: Medicare Other

## 2020-02-01 ENCOUNTER — Telehealth: Payer: Medicare Other

## 2020-02-01 DIAGNOSIS — I10 Essential (primary) hypertension: Secondary | ICD-10-CM

## 2020-02-01 DIAGNOSIS — E785 Hyperlipidemia, unspecified: Secondary | ICD-10-CM

## 2020-02-01 NOTE — Chronic Care Management (AMB) (Signed)
Chronic Care Management Pharmacy  Name: [name]  MRN: [id number] DOB: 1934/05/09   Chief Complaint/ HPI  [name],  86 y.o. , female presents for their Initial CCM visit with the clinical pharmacist via telephone.  PCP : Martinique, Betty G, MD Patient Care Team: Martinique, Betty G, MD as PCP - General (Family Medicine) Germaine Pomfret, [facility] as Pharmacist (Pharmacist)  Their chronic conditions include: Hypertension, Hyperlipidemia, Atrial Fibrillation, Asthma, Chronic Kidney Disease, Hypothyroidism, Osteoarthritis, Gout, Allergic Rhinitis and Secondary Hypoparathyroidism   Office Visits: 03/24/19: Patient presented to Dr. Martinique for follow-up. A1c worsened to 6.0%, UCx postive for bacteria, started on Bactrim.   Consult Visit: None noted in past 6 months.   Works with Physiological scientist once weekly 30 minutes.  Lives alone, stays active walking around house, getting mail.    Allergies  Allergen Reactions   Shellfish Allergy     Medications: Outpatient Encounter Medications as of 02/01/2020  Medication Sig Note   acetaminophen (TYLENOL) 650 MG CR tablet Take 650 mg by mouth every 8 (eight) hours as needed for pain (pain).  07/18/2014: Pt stated she "threw up" medication this past week.   allopurinol (ZYLOPRIM) 100 MG tablet TAKE 1 TABLET(100 MG) BY MOUTH TWICE DAILY    amLODipine (NORVASC) 5 MG tablet TAKE 1 TABLET(5 MG) BY MOUTH DAILY    aspirin 81 MG EC tablet Take 81 mg by mouth daily.   07/18/2014: Pt stated she "threw up" medication this past week.    calcitRIOL (ROCALTROL) 0.25 MCG capsule Take 1 capsule (0.25 mcg total) by mouth every other day.    calcium carbonate (TUMS) 500 MG chewable tablet Chew 1 tablet by mouth daily.   07/18/2014: Pt stated she "threw up" medication this past week.    cephALEXin (KEFLEX) 500 MG capsule TK 1 C PO BID    cetirizine (ZYRTEC) 10 MG tablet Take 10 mg by mouth daily.   07/18/2014: Pt stated she "threw up" medication  this past week.    furosemide (LASIX) 40 MG tablet TAKE 1 TABLET(40 MG) BY MOUTH DAILY    levothyroxine (SYNTHROID) 50 MCG tablet TAKE 1 TABLET BY MOUTH ONCE DAILY THEN 1/2 TABLET THE NEXT DAY ROTATING    potassium chloride SA (KLOR-CON) 20 MEQ tablet TAKE 1 TABLET(20 MEQ) BY MOUTH DAILY    simvastatin (ZOCOR) 20 MG tablet TAKE 1 TABLET BY MOUTH EVERY NIGHT AT BEDTIME    No facility-administered encounter medications on file as of 02/01/2020.    Current Diagnosis/Assessment:    Goals Addressed   None    Asthma   Last spirometry score: n/a  Eosinophil count:   Lab Results  Component Value Date/Time   EOSPCT 7.0 (H) 06/18/2016 08:49 AM  %                               Eos (Absolute):  Lab Results  Component Value Date/Time   EOSABS 0.6 06/18/2016 08:49 AM    Tobacco Status:  Social History   Tobacco Use  Smoking Status Never Smoker  Smokeless Tobacco Never Used    Patient has failed these meds in past: Albuterol ("allergic")  Patient is currently controlled on the following medications:    None  Using maintenance inhaler regularly? No Frequency of rescue inhaler use:  never  We discussed: shortness of breath driven primarily by allergy exacerbations.  Plan  Continue control with diet and exercise  Hypertension  BP goal is:  <130/80  Office blood pressures are  BP Readings from Last 3 Encounters:  03/24/19 120/72  03/18/18 124/82  04/07/17 115/60   CMP Latest Ref Rng & Units 03/28/2019 03/24/2019 03/18/2018  Glucose 70 - 99 mg/dL - - -  BUN 6 - 23 mg/dL - - -  Creatinine 0.40 - 1.20 mg/dL - - -  Sodium 135 - 145 mEq/L - - -  Potassium 3.5 - 5.1 mEq/L 3.8 - -  Chloride 96 - 112 mEq/L - - -  CO2 19 - 32 mEq/L - - -  Calcium 8.4 - 10.5 mg/dL - - -  Total Protein 6.0 - 8.3 g/dL - 7.1 7.0  Total Bilirubin 0.2 - 1.2 mg/dL - 0.8 0.8  Alkaline Phos 39 - 117 U/L - 82 82  AST 0 - 37 U/L - 18 20  ALT 0 - 35 U/L - 12 16   Patient checks BP at home  infrequently Patient home BP readings are ranging: n/a  Patient has failed these meds in the past: n/a Patient is currently controlled on the following medications:   Amlodipine 5 mg daily   Furosemide 40 mg daily   We discussed diet and exercise extensively. Frozen/ Pre-made cooking. Eating out 1-2 weekly.   Plan  Continue current medications   Hyperlipidemia   LDL goal < 100  Lipid Panel     Component Value Date/Time   CHOL 179 03/24/2019 1239   TRIG 136.0 03/24/2019 1239   HDL 60.50 03/24/2019 1239   LDLCALC 91 03/24/2019 1239   LDLDIRECT 91.4 02/03/2010 1022    Hepatic Function Latest Ref Rng & Units 03/24/2019 03/18/2018 06/18/2016  Total Protein 6.0 - 8.3 g/dL 7.1 7.0 6.8  Albumin 3.5 - 5.2 g/dL 4.6 4.6 4.6  AST 0 - 37 U/L _0 ALT 0 - 35 U/L _1 Alk Phosphatase 39 - 117 U/L 82 82 76  Total Bilirubin 0.2 - 1.2 mg/dL 0.8 0.8 0.4  Bilirubin, Direct 0.0 - 0.3 mg/dL 0.1 0.1 0.1     The ASCVD Risk score ([place]., et al., 2013) failed to calculate for the following reasons:   The 2013 ASCVD risk score is only valid for ages 55 to 81   Patient has failed these meds in past: n/a Patient is currently controlled on the following medications:   Simvastatin 20 mg QHS   We discussed:  diet and exercise extensively  Plan  Continue current medications and control with diet and exercise  Hypothyroidism   Lab Results  Component Value Date/Time   TSH 3.03 03/24/2019 12:39 PM   TSH 2.73 03/18/2018 12:54 PM    Patient has failed these meds in past: n/a Patient is currently controlled on the following medications:   Levothyroxine 50 mcg and 25 mcg alternating  We discussed: Separates from food / other medications by at least 30 minutes.  Plan  Continue current medications    Allergic Rhinitis    Patient has failed these meds in past: n/a Patient is currently controlled on the following medications:   Cetirizine 10 mg daily   We discussed:   Allergy symptoms   Plan  Continue current medications  Gout   Uric Acid, Serum  Date Value Ref Range Status  01/19/2008 2.5 2.4 - 7.0 mg/dL Final     Goal Uric Acid < 6 mg/dL   Medications that may increase uric acid levels: Loop Diuretics   Last gout flare: "Very  long time"   Patient has failed these meds in past: n/a Patient is currently controlled on the following medications:   Allopurinol 100 mg BID   We discussed: Patient avoids alcohol.   Plan  Continue current medications   Misc / OTC    APAP 650 mg CR q8hr PRN (when doing physical activity)   Aspirin 81 mg daily   Calcitriol 0.25 mcg every other day   Tums 500 mg daily   Potassium Chloride 20 mEq daily   Plan  Continue current medications   Medication Management   Pt uses [facility] for all medications. Keeps morning and medications organized.  Uses pill box? Yes  Plan  Continue current medication management strategy  Follow up: 6 month phone visit  [facility]

## 2020-02-13 ENCOUNTER — Other Ambulatory Visit: Payer: Self-pay | Admitting: Family Medicine

## 2020-02-13 DIAGNOSIS — E876 Hypokalemia: Secondary | ICD-10-CM

## 2020-02-13 DIAGNOSIS — I10 Essential (primary) hypertension: Secondary | ICD-10-CM

## 2020-02-13 DIAGNOSIS — E785 Hyperlipidemia, unspecified: Secondary | ICD-10-CM

## 2020-03-06 DIAGNOSIS — H353121 Nonexudative age-related macular degeneration, left eye, early dry stage: Secondary | ICD-10-CM | POA: Diagnosis not present

## 2020-03-06 DIAGNOSIS — H5213 Myopia, bilateral: Secondary | ICD-10-CM | POA: Diagnosis not present

## 2020-03-06 DIAGNOSIS — E119 Type 2 diabetes mellitus without complications: Secondary | ICD-10-CM | POA: Diagnosis not present

## 2020-03-06 DIAGNOSIS — H43813 Vitreous degeneration, bilateral: Secondary | ICD-10-CM | POA: Diagnosis not present

## 2020-03-15 ENCOUNTER — Telehealth: Payer: Self-pay

## 2020-03-15 DIAGNOSIS — I10 Essential (primary) hypertension: Secondary | ICD-10-CM

## 2020-03-15 DIAGNOSIS — E039 Hypothyroidism, unspecified: Secondary | ICD-10-CM

## 2020-03-15 NOTE — Progress Notes (Signed)
Chronic Care Management Pharmacy Assistant   Name: [name]  MRN: [id number] DOB: [birth year]  Reason for Encounter: Hypertension Disease State Call   PCP : Martin[place], Betty G, MD  Allergies:   Allergies  Allergen Reactions  . Shellfish Allergy     Medications: Outpatient Encounter Medications as of 03/15/2020  Medication Sig Note  . acetaminophen (TYLENOL) 650 MG CR tablet Take 650 mg by mouth every 8 (eight) hours as needed for pain (pain).  07/18/2014: Pt stated she "threw up" medication this past week.  Marland Kitchen allopurinol (ZYLOPRIM) 100 MG tablet TAKE 1 TABLET(100 MG) BY MOUTH TWICE DAILY   . amLODipine (NORVASC) 5 MG tablet TAKE 1 TABLET(5 MG) BY MOUTH DAILY   . aspirin 81 MG EC tablet Take 81 mg by mouth daily.   07/18/2014: Pt stated she "threw up" medication this past week.   . calcitRIOL (ROCALTROL) 0.25 MCG capsule Take 1 capsule (0.25 mcg total) by mouth every other day.   . calcium carbonate (TUMS) 500 MG chewable tablet Chew 1 tablet by mouth daily.   (Patient not taking: Reported on 02/01/2020) 07/18/2014: Pt stated she "threw up" medication this past week.   . cephALEXin (KEFLEX) 500 MG capsule TK 1 C PO BID   . cetirizine (ZYRTEC) 10 MG tablet Take 10 mg by mouth daily.   07/18/2014: Pt stated she "threw up" medication this past week.   . furosemide (LASIX) 40 MG tablet TAKE 1 TABLET(40 MG) BY MOUTH DAILY   . levothyroxine (SYNTHROID) 50 MCG tablet TAKE 1 TABLET BY MOUTH ONCE DAILY THEN 1/2 TABLET THE NEXT DAY ROTATING   . potassium chloride SA (KLOR-CON) 20 MEQ tablet TAKE 1 TABLET(20 MEQ) BY MOUTH DAILY   . simvastatin (ZOCOR) 20 MG tablet TAKE 1 TABLET BY MOUTH EVERY NIGHT AT BEDTIME    No facility-administered encounter medications on file as of 03/15/2020.    Current Diagnosis: Patient Active Problem List   Diagnosis Date Noted  . Secondary hypoparathyroidism ([place]) 03/24/2019  . Hot flashes not due to menopause 02/13/2016  . Abdominal pain   . Abnormal x-ray  of abdomen   . Mitral regurgitation 07/22/2014  . Sinus pause 07/20/2014  . Atrial fibrillation with controlled ventricular response ([place]) 07/20/2014  . SBO (small bowel obstruction) ([place]) 07/18/2014  . CKD (chronic kidney disease) stage 3, GFR 30-59 ml/min 07/18/2014  . Small bowel obstruction ([place]) 07/18/2014  . Acute on chronic renal failure ([place]) 07/18/2014  . Asthma exacerbation, non-allergic 07/03/2011  . COUGH 02/10/2010  . Hypothyroidism 02/08/2009  . Hyperlipidemia 01/19/2008  . Gouty arthropathy 01/19/2008  . DYSPLASTIC NEVUS, BACK 06/17/2007  . Essential hypertension 12/02/2006  . Allergic rhinitis 12/02/2006  . Osteoarthritis 12/02/2006    Goals Addressed   None     Follow-Up:  Pharmacist Review   Reviewed chart prior to disease state call. Spoke with patient regarding BP  Recent Office Vitals: BP Readings from Last 3 Encounters:  03/24/19 120/72  03/18/18 124/82  04/07/17 115/60   Pulse Readings from Last 3 Encounters:  03/24/19 93  03/18/18 86  04/07/17 92    Wt Readings from Last 3 Encounters:  03/24/19 191 lb 6 oz (86.8 kg)  03/18/18 192 lb 6 oz (87.3 kg)  04/07/17 194 lb 6 oz (88.2 kg)     Kidney Function Lab Results  Component Value Date/Time   CREATININE 1.42 (H) 04/07/2017 03:20 PM   CREATININE 1.46 (H) 06/18/2016 08:49 AM   GFR 37.69 (L) 04/07/2017 03:20  PM   GFRNONAA 38 (L) 07/26/2014 04:45 AM   GFRAA 44 (L) 07/26/2014 04:45 AM    BMP Latest Ref Rng & Units 03/28/2019 04/07/2017 06/18/2016  Glucose 70 - 99 mg/dL - 129(H) 120(H)  BUN 6 - 23 mg/dL - 26(H) 32(H)  Creatinine 0.40 - 1.20 mg/dL - 1.42(H) 1.46(H)  Sodium 135 - 145 mEq/L - 138 139  Potassium 3.5 - 5.1 mEq/L 3.8 4.1 4.0  Chloride 96 - 112 mEq/L - 99 103  CO2 19 - 32 mEq/L - 25 27  Calcium 8.4 - 10.5 mg/dL - 10.3 9.9    . Current antihypertensive regimen:  o Amlodipine 5 mg Tablet . How often are you checking your Blood Pressure? infrequently . Current home BP readings:    o Patient states she has not taken her blood pressure in a few weeks. . What recent interventions/DTPs have been made by any provider to improve Blood Pressure control since last CPP Visit: None ID . Any recent hospitalizations or ED visits since last visit with CPP? No . What diet changes have been made to improve Blood Pressure Control?  o Patient states her diet consist of salad,take out from restaurants, and  microwavable  meals. o Patient states she does not cook at home and prefers to  eats out at restaurants.  . What exercise is being done to improve your Blood Pressure Control?  o Patient reports she does a work out with a trainer once a week. Patient states walks around her house , does chair exercise and house hold chores.   Adherence Review: Is the patient currently on ACE/ARB medication? No Does the patient have >5 day gap between last estimated fill dates? Yes   [place] Pharmacist Assistant [phone]

## 2020-03-27 ENCOUNTER — Encounter: Payer: Self-pay | Admitting: Family Medicine

## 2020-03-27 ENCOUNTER — Other Ambulatory Visit: Payer: Self-pay

## 2020-03-27 ENCOUNTER — Ambulatory Visit (INDEPENDENT_AMBULATORY_CARE_PROVIDER_SITE_OTHER): Payer: Medicare Other | Admitting: Family Medicine

## 2020-03-27 VITALS — BP 130/78 | HR 100 | Temp 97.6°F | Resp 16 | Ht 62.0 in | Wt 200.5 lb

## 2020-03-27 DIAGNOSIS — I4891 Unspecified atrial fibrillation: Secondary | ICD-10-CM

## 2020-03-27 DIAGNOSIS — E039 Hypothyroidism, unspecified: Secondary | ICD-10-CM

## 2020-03-27 DIAGNOSIS — E208 Other hypoparathyroidism: Secondary | ICD-10-CM | POA: Diagnosis not present

## 2020-03-27 DIAGNOSIS — E785 Hyperlipidemia, unspecified: Secondary | ICD-10-CM | POA: Diagnosis not present

## 2020-03-27 DIAGNOSIS — I10 Essential (primary) hypertension: Secondary | ICD-10-CM | POA: Diagnosis not present

## 2020-03-27 DIAGNOSIS — L821 Other seborrheic keratosis: Secondary | ICD-10-CM

## 2020-03-27 DIAGNOSIS — R300 Dysuria: Secondary | ICD-10-CM

## 2020-03-27 DIAGNOSIS — R7303 Prediabetes: Secondary | ICD-10-CM | POA: Diagnosis not present

## 2020-03-27 DIAGNOSIS — Z23 Encounter for immunization: Secondary | ICD-10-CM

## 2020-03-27 DIAGNOSIS — Z0000 Encounter for general adult medical examination without abnormal findings: Secondary | ICD-10-CM | POA: Diagnosis not present

## 2020-03-27 NOTE — Progress Notes (Signed)
HPI: [name] is a 86 y.o. female, who is here today for AWV and annual follow up.   She was last seen on 03/24/19. No new problems since her last visit.  Last AWV on 03/24/19.  She lives alone. Her husband died 7 years ago. Her children call her daily.  Independent ADL's and IADL's. Functional Status Survey: Is the patient deaf or have difficulty hearing?: No Does the patient have difficulty seeing, even when wearing glasses/contacts?: No Does the patient have difficulty concentrating, remembering, or making decisions?: No Does the patient have difficulty walking or climbing stairs?: No Does the patient have difficulty dressing or bathing?: No Does the patient have difficulty doing errands alone such as visiting a doctor's office or shopping?: No  Fall Risk  03/27/2020 03/25/2019 03/18/2018 03/02/2017 06/11/2016  Falls in the past year? 1 0 No No No  Comment - - - - Emmi Telephone Survey: data to providers prior to load  Number falls in past yr: 0 0 - - -  Injury with Fall? 0 0 - - -  Follow up Education provided Education provided - - -   She fell on her neighbor's yard(grass) when she bend down to pick up the newspaper, tripped on uneven pavement when she was reaching. Got up by herself,no serious injury.  Providers she sees regularly: Eye care provider: Dr Kathrin Penner. Nephrologist: Dr Hollie Salk  Depression screen [facility] 2/9 03/27/2020  Decreased Interest 0  Down, Depressed, Hopeless 0  PHQ - 2 Score 0    Mini-Cog - 03/27/20 1831    Normal clock drawing test? yes    How many words correct? 3           Hearing Screening   _0  _1  _2  _3  _4  _5  _6  _7  _8   Right ear:           Left ear:             Visual Acuity Screening   Right eye Left eye Both eyes  Without correction:     With correction: _9   CKD III and secondary hyperparathyroidism: Follows wit nephrologist q 4-6 months. She has not noted decreased urine  output,gross hematuria,or foam in urine.  She thinks she has a UTI because for the past 3 weeks she has had intermittent episodes of dysuria. Urine in cloudy. Negative for urgency or increased frequency. No pelvic pain.  HTN: She is on Amlodipine 5 mg daily. Furosemide 40 mg daily for LE edema. HypoK+ on KLOR 20 meq daily.  Hypothyroidism: She is on Levothyroxine 50 mcg daily. Last TSH in 03/2019 was normal at 3.0. Negative for fatigue,cold/heat intolerance,changes in bowel habits,or wt changes.  Negative for severe/frequent headache, visual changes, chest pain, dyspnea, palpitation,focal weakness, or worsening edema. Hx of episode of atrial fib during hospitalization in 07/2014 as well as episodes of sinus pauses, these was attributed to BB she received IV to treat atrial fib. She is not longer on Aspirin 81 mg daily.  HLD: She is on Simvastatin 20 mg daily. Tolerating medications well. She follows a healthful diet in general. She does some upper and lower extremities repetitive movement while seated, not consistently.  Component     Latest Ref Rng & Units 03/24/2019  Cholesterol     <200 mg/dL 179  Triglycerides     <150 mg/dL 136.0  HDL Cholesterol     > OR = 50 mg/dL 60.50  VLDL     0.0 - 40.0 mg/dL  27.2  LDL (calc)     0 - 99 mg/dL 91  Total CHOL/HDL Ratio     <5.0 (calc) 3  NonHDL      118.57   Prediabetes: Negative for polydipsia,polyuria, or polyphagia.  Last HgA1C on 03/28/19 was 6.0.  Today she is concerned about skin lesions on left-sided neck. Noted lesions a few weeks ago, no tenderness or pruritus. No size changes.  Review of Systems  Constitutional: Negative for activity change, appetite change and fever.  HENT: Negative for mouth sores, nosebleeds and sore throat.   Eyes: Negative for photophobia and redness.  Respiratory: Negative for cough and wheezing.   Gastrointestinal: Negative for abdominal pain, nausea and vomiting.  Musculoskeletal: Positive  for arthralgias. Negative for gait problem.  Skin: Negative for rash and wound.  Allergic/Immunologic: Positive for environmental allergies.  Neurological: Negative for syncope, facial asymmetry and weakness.  Psychiatric/Behavioral: Negative for confusion. The patient is not nervous/anxious.   Rest of ROS, see pertinent positives sand negatives in HPI  Current Outpatient Medications on File Prior to Visit  Medication Sig Dispense Refill  . acetaminophen (TYLENOL) 650 MG CR tablet Take 650 mg by mouth every 8 (eight) hours as needed for pain (pain).     Marland Kitchen allopurinol (ZYLOPRIM) 100 MG tablet TAKE 1 TABLET(100 MG) BY MOUTH TWICE DAILY 180 tablet 1  . amLODipine (NORVASC) 5 MG tablet TAKE 1 TABLET(5 MG) BY MOUTH DAILY 90 tablet 3  . aspirin 81 MG EC tablet Take 81 mg by mouth daily.      . calcitRIOL (ROCALTROL) 0.25 MCG capsule Take 1 capsule (0.25 mcg total) by mouth every other day. 90 capsule 1  . cetirizine (ZYRTEC) 10 MG tablet Take 10 mg by mouth daily.      . furosemide (LASIX) 40 MG tablet TAKE 1 TABLET(40 MG) BY MOUTH DAILY 90 tablet 1  . levothyroxine (SYNTHROID) 50 MCG tablet TAKE 1 TABLET BY MOUTH ONCE DAILY THEN 1/2 TABLET THE NEXT DAY ROTATING 70 tablet 3  . potassium chloride SA (KLOR-CON) 20 MEQ tablet TAKE 1 TABLET(20 MEQ) BY MOUTH DAILY 90 tablet 3  . simvastatin (ZOCOR) 20 MG tablet TAKE 1 TABLET BY MOUTH EVERY NIGHT AT BEDTIME 90 tablet 3   No current facility-administered medications on file prior to visit.   Past Medical History:  Diagnosis Date  . Allergy   . Arthritis   . Broken foot    left  . Cancer ([place])    ovarian  . CKD (chronic kidney disease), stage III   . Hyperlipidemia   . Hypertension   . Obesity   . Thyroid disease    Allergies  Allergen Reactions  . Drug Ingredient [Sulfamethoxazole-Trimethoprim]     Lips edema,dizziness,and shaky sensation.  Marland Kitchen Shellfish Allergy     Social History   Socioeconomic History  . Marital status: Married     Spouse name: Not on file  . Number of children: Not on file  . Years of education: Not on file  . Highest education level: Not on file  Occupational History  . Not on file  Tobacco Use  . Smoking status: Never Smoker  . Smokeless tobacco: Never Used  Substance and Sexual Activity  . Alcohol use: No  . Drug use: No  . Sexual activity: Never  Other Topics Concern  . Not on file  Social History Narrative  . Not on file   Social Determinants of Health   Financial Resource Strain: Low Risk   . Difficulty  of Paying Living Expenses: Not hard at all  Food Insecurity:   . Worried About Charity fundraiser in the Last Year: Not on file  . Ran Out of Food in the Last Year: Not on file  Transportation Needs: No Transportation Needs  . Lack of Transportation (Medical): No  . Lack of Transportation (Non-Medical): No  Physical Activity:   . Days of Exercise per Week: Not on file  . Minutes of Exercise per Session: Not on file  Stress:   . Feeling of Stress : Not on file  Social Connections:   . Frequency of Communication with Friends and Family: Not on file  . Frequency of Social Gatherings with Friends and Family: Not on file  . Attends Religious Services: Not on file  . Active Member of Clubs or Organizations: Not on file  . Attends Archivist Meetings: Not on file  . Marital Status: Not on file    Vitals:   03/27/20 1112  BP: 130/78  Pulse: 100  Resp: 16  Temp: 97.6 F (36.4 C)  SpO2: 95%   Wt Readings from Last 3 Encounters:  03/27/20 200 lb 8 oz (90.9 kg)  03/24/19 191 lb 6 oz (86.8 kg)  03/18/18 192 lb 6 oz (87.3 kg)   Body mass index is 36.67 kg/m.  Physical Exam Vitals and nursing note reviewed.  Constitutional:      General: She is not in acute distress.    Appearance: She is well-developed.  HENT:     Head: Normocephalic and atraumatic.     Mouth/Throat:     Mouth: Mucous membranes are moist.     Pharynx: Oropharynx is clear.  Eyes:      Conjunctiva/sclera: Conjunctivae normal.     Pupils: Pupils are equal, round, and reactive to light.  Cardiovascular:     Rate and Rhythm: Normal rate and regular rhythm.     Heart sounds: No murmur heard.      Comments: DP pulses present. Pulmonary:     Effort: Pulmonary effort is normal. No respiratory distress.     Breath sounds: Normal breath sounds.  Abdominal:     Palpations: Abdomen is soft. There is no hepatomegaly or mass.     Tenderness: There is no abdominal tenderness.  Lymphadenopathy:     Cervical: No cervical adenopathy.  Skin:    General: Skin is warm.     Findings: No erythema or rash.     Comments: 2 rounded 3 mm skin lesions, under left ear. Defined borders,clear and rough surface.  Neurological:     General: No focal deficit present.     Mental Status: She is alert and oriented to person, place, and time.     Cranial Nerves: No cranial nerve deficit.     Gait: Gait normal.  Psychiatric:        Mood and Affect: Mood and affect normal.     Comments: Well groomed, good eye contact.   ASSESSMENT AND PLAN:  [name] was seen today for AWV and 12 months follow-up.  Orders Placed This Encounter  Procedures  . MICROSCOPIC MESSAGE  . Flu Vaccine QUAD High Dose(Fluad)  . Hemoglobin A1c  . Lipid panel  . Urinalysis, Routine w reflex microscopic  . Hepatic function panel  . TSH   Lab Results  Component Value Date   ALT 15 03/27/2020   AST 24 03/27/2020   ALKPHOS 82 03/24/2019   BILITOT 0.7 03/27/2020   Lab Results  Component Value Date   CHOL 193 03/27/2020   HDL 69 03/27/2020   LDLCALC 104 (H) 03/27/2020   LDLDIRECT 91.4 02/03/2010   TRIG 105 03/27/2020   CHOLHDL 2.8 03/27/2020   Lab Results  Component Value Date   HGBA1C 5.8 (H) 03/27/2020    Medicare annual wellness visit, subsequent We discussed the importance of staying active, physically and mentally, as well as the benefits of a healthy/balance diet. Low impact exercise that  involve stretching and strengthing are ideal. Vaccines up to date. We discussed preventive screening for the next 5-10 years, summery of recommendations given in AVS. Fall prevention.  Advance directives and end of life discussed, she has POA and living will.   Atrial fibrillation, unspecified type ([place]) One episode documented during hospitalization in 2016. We discussed summery discharged. Asymptomatic and heart ir regular on auscultation. Instructed about warning signs.  Secondary hypoparathyroidism ([place]) Follows with nephrologist.  Hyperlipidemia, unspecified hyperlipidemia type Continue Simvastatin 20 mg daily. Has tolerated well with low dose CCB. Further recommendations according to lab results.  Hypothyroidism, unspecified type No changes in current management, will follow TSH and will give further recommendations accordingly.  Essential hypertension BP adequately controlled. No changes in current management. Continue low salt diet.  Prediabetes Healthy life style for primary prevention.  Dysuria We discussed possible etiologies. Hx of vaginal atrophy,she is not longer on Premarin vaginal cream. U/A ordered.  Seborrheic keratosis Reassured about malignancy. Continue monitoring skin lesions for changes.  Need for influenza vaccination -     Flu Vaccine QUAD High Dose(Fluad)  Other orders -     MICROSCOPIC MESSAGE   Return in about 1 year (around 03/27/2021) for AWV and f/u.  Tahani Potier G. [place], MD  [facility]. [place] office.  Ms. Pompa , Thank you for taking time to come for your Medicare Wellness Visit. I appreciate your ongoing commitment to your health goals. Please review the following plan we discussed and let me know if I can assist you in the future.   These are the goals we discussed: Goals    . Chronic Care Management        . Exercise 3x per week (30 min per time)     10-15 min of walking daily as tolerated. Continue following  a healthful diet. Fall prevention.   . patient     Between reading will move more;  The plan is to stretch, walk or strength train every chapter.     . patient     Stay active !       This is a list of the screening recommended for you and due dates:  Health Maintenance  Topic Date Due  . COVID-19 Vaccine (1) Never done  . Flu Shot  02/04/2020  . Tetanus Vaccine  03/03/2027  . DEXA scan (bone density measurement)  Completed  . Pneumonia vaccines  Completed  . Urine Protein Check  Discontinued   A few things to remember from today's visit:   Hyperlipidemia, unspecified hyperlipidemia type - Plan: Lipid panel, Hepatic function panel, CANCELED: COMPLETE METABOLIC PANEL WITH GFR  Atrial fibrillation, unspecified type ([place]), Chronic  Secondary hypoparathyroidism ([place]), Chronic  Hypothyroidism, unspecified type  Essential hypertension - Plan: Hepatic function panel, CANCELED: COMPLETE METABOLIC PANEL WITH GFR  Prediabetes - Plan: Hemoglobin A1c  Dysuria - Plan: Urinalysis, Routine w reflex microscopic  If you need refills please call your pharmacy. Do not use My Chart to request refills or for acute issues that need immediate attention.  Please be sure medication list is accurate. If a new problem present, please set up appointment sooner than planned today.  A few tips:  -As we age balance is not as good as it was, so there is a higher risks for falls. Please remove small rugs and furniture that is "in your way" and could increase the risk of falls. Stretching exercises may help with fall prevention: Yoga and Tai Chi are some examples. Low impact exercise is better, so you are not very achy the next day.  -Sun screen and avoidance of direct sun light recommended. Caution with dehydration, if working outdoors be sure to drink enough fluids.  - Some medications are not safe as we age, increases the risk of side effects and can potentially interact with other medication  you are also taken;  including some of over the counter medications. Be sure to let me know when you start a new medication even if it is a dietary/vitamin supplement.   -Healthy diet low in red meet/animal fat and sugar + regular physical activity is recommended.

## 2020-03-27 NOTE — Patient Instructions (Addendum)
Ms. Barrero , Thank you for taking time to come for your Medicare Wellness Visit. I appreciate your ongoing commitment to your health goals. Please review the following plan we discussed and let me know if I can assist you in the future.   These are the goals we discussed: Goals     Chronic Care Management         Exercise 3x per week (30 min per time)     10-15 min of walking daily as tolerated. Continue following a healthful diet. Fall prevention.    patient     Between reading will move more;  The plan is to stretch, walk or strength train every chapter.      patient     Stay active !       This is a list of the screening recommended for you and due dates:  Health Maintenance  Topic Date Due   COVID-19 Vaccine (1) Never done   Flu Shot  02/04/2020   Tetanus Vaccine  03/03/2027   DEXA scan (bone density measurement)  Completed   Pneumonia vaccines  Completed   Urine Protein Check  Discontinued   A few things to remember from today's visit:   Hyperlipidemia, unspecified hyperlipidemia type - Plan: Lipid panel, Hepatic function panel, CANCELED: COMPLETE METABOLIC PANEL WITH GFR  Atrial fibrillation, unspecified type ([place]), Chronic  Secondary hypoparathyroidism ([place]), Chronic  Hypothyroidism, unspecified type  Essential hypertension - Plan: Hepatic function panel, CANCELED: COMPLETE METABOLIC PANEL WITH GFR  Prediabetes - Plan: Hemoglobin A1c  Dysuria - Plan: Urinalysis, Routine w reflex microscopic  If you need refills please call your pharmacy. Do not use My Chart to request refills or for acute issues that need immediate attention.    Please be sure medication list is accurate. If a new problem present, please set up appointment sooner than planned today.  A few tips:  -As we age balance is not as good as it was, so there is a higher risks for falls. Please remove small rugs and furniture that is "in your way" and could increase the risk of  falls. Stretching exercises may help with fall prevention: Yoga and Tai Chi are some examples. Low impact exercise is better, so you are not very achy the next day.  -Sun screen and avoidance of direct sun light recommended. Caution with dehydration, if working outdoors be sure to drink enough fluids.  - Some medications are not safe as we age, increases the risk of side effects and can potentially interact with other medication you are also taken;  including some of over the counter medications. Be sure to let me know when you start a new medication even if it is a dietary/vitamin supplement.   -Healthy diet low in red meet/animal fat and sugar + regular physical activity is recommended.

## 2020-03-28 LAB — URINALYSIS, ROUTINE W REFLEX MICROSCOPIC
Bilirubin Urine: NEGATIVE
Glucose, UA: NEGATIVE
Ketones, ur: NEGATIVE
Nitrite: NEGATIVE
Specific Gravity, Urine: 1.014 (ref 1.001–1.03)
pH: 6.5 (ref 5.0–8.0)

## 2020-03-28 LAB — HEPATIC FUNCTION PANEL
AG Ratio: 1.7 (calc) (ref 1.0–2.5)
ALT: 15 U/L (ref 6–29)
AST: 24 U/L (ref 10–35)
Albumin: 4.5 g/dL (ref 3.6–5.1)
Alkaline phosphatase (APISO): 78 U/L (ref 37–153)
Bilirubin, Direct: 0.2 mg/dL (ref 0.0–0.2)
Globulin: 2.7 g/dL (calc) (ref 1.9–3.7)
Indirect Bilirubin: 0.5 mg/dL (calc) (ref 0.2–1.2)
Total Bilirubin: 0.7 mg/dL (ref 0.2–1.2)
Total Protein: 7.2 g/dL (ref 6.1–8.1)

## 2020-03-28 LAB — LIPID PANEL
Cholesterol: 193 mg/dL (ref <200)
HDL: 69 mg/dL (ref >=50)
LDL Cholesterol (Calc): 104 mg/dL (calc) — ABNORMAL HIGH
Non-HDL Cholesterol (Calc): 124 mg/dL (calc) (ref <130)
Total CHOL/HDL Ratio: 2.8 (calc) (ref <5.0)
Triglycerides: 105 mg/dL (ref <150)

## 2020-03-28 LAB — HEMOGLOBIN A1C
Hgb A1c MFr Bld: 5.8 % of total Hgb — ABNORMAL HIGH (ref <5.7)
Mean Plasma Glucose: 120 (calc)
eAG (mmol/L): 6.6 (calc)

## 2020-03-30 MED ORDER — CEPHALEXIN 500 MG PO CAPS: 500.0000 mg | ORAL_CAPSULE | Freq: Three times a day (TID) | ORAL | 0 refills | Status: AC

## 2020-04-02 ENCOUNTER — Other Ambulatory Visit: Payer: Self-pay | Admitting: Family Medicine

## 2020-04-02 DIAGNOSIS — N183 Chronic kidney disease, stage 3 unspecified: Secondary | ICD-10-CM

## 2020-04-02 DIAGNOSIS — E208 Other hypoparathyroidism: Secondary | ICD-10-CM

## 2020-04-03 ENCOUNTER — Telehealth: Payer: Self-pay | Admitting: Family Medicine

## 2020-04-03 NOTE — Telephone Encounter (Signed)
Pt returned the call to the office. 

## 2020-04-03 NOTE — Telephone Encounter (Signed)
See result note.  

## 2020-04-21 ENCOUNTER — Other Ambulatory Visit: Payer: Self-pay | Admitting: Family Medicine

## 2020-04-21 DIAGNOSIS — E039 Hypothyroidism, unspecified: Secondary | ICD-10-CM

## 2020-05-08 DIAGNOSIS — M109 Gout, unspecified: Secondary | ICD-10-CM | POA: Diagnosis not present

## 2020-05-08 DIAGNOSIS — N1832 Chronic kidney disease, stage 3b: Secondary | ICD-10-CM | POA: Diagnosis not present

## 2020-05-08 DIAGNOSIS — I129 Hypertensive chronic kidney disease with stage 1 through stage 4 chronic kidney disease, or unspecified chronic kidney disease: Secondary | ICD-10-CM | POA: Diagnosis not present

## 2020-05-08 DIAGNOSIS — N390 Urinary tract infection, site not specified: Secondary | ICD-10-CM | POA: Diagnosis not present

## 2020-05-08 DIAGNOSIS — Z8543 Personal history of malignant neoplasm of ovary: Secondary | ICD-10-CM | POA: Diagnosis not present

## 2020-05-08 DIAGNOSIS — N2581 Secondary hyperparathyroidism of renal origin: Secondary | ICD-10-CM | POA: Diagnosis not present

## 2020-05-10 DIAGNOSIS — Z23 Encounter for immunization: Secondary | ICD-10-CM | POA: Diagnosis not present

## 2020-05-13 ENCOUNTER — Other Ambulatory Visit: Payer: Self-pay | Admitting: Family Medicine

## 2020-05-13 DIAGNOSIS — M109 Gout, unspecified: Secondary | ICD-10-CM

## 2020-08-07 ENCOUNTER — Ambulatory Visit: Payer: Medicare Other | Admitting: Pharmacist

## 2020-08-07 DIAGNOSIS — I10 Essential (primary) hypertension: Secondary | ICD-10-CM

## 2020-08-07 DIAGNOSIS — E785 Hyperlipidemia, unspecified: Secondary | ICD-10-CM

## 2020-08-07 NOTE — Chronic Care Management (AMB) (Signed)
Chronic Care Management Pharmacy  Name: [name]  MRN: [id number] DOB: [birth year]  Chief Complaint/ HPI  [name],  87 y.o. , female presents for their Follow-Up CCM visit with the clinical pharmacist via telephone.  PCP : Martinique, Betty G, MD Patient Care Team: Martinique, Betty G, MD as PCP - General (Family Medicine) Viona Gilmore, [facility] as Pharmacist (Pharmacist)  Their chronic conditions include: Hypertension, Hyperlipidemia, Atrial Fibrillation, Asthma, Chronic Kidney Disease, Hypothyroidism, Osteoarthritis, Gout, Allergic Rhinitis and Secondary Hypoparathyroidism   Office Visits: 03/27/20 Betty Martinique, MD: Patient presented for medicare annual wellness exam. A1c slightly improved to 5.8%.  Consult Visit: 05/08/20 Madelon Lips (nephrology): Patient presented for office visit. Unable to access notes.  03/06/20 Shon Hough (ophthalmology): Patient presented for eye exam.  Works with personal trainer once weekly 30 minutes.  Lives alone, stays active walking around house, getting mail.    Allergies  Allergen Reactions  . Drug Ingredient [Sulfamethoxazole-Trimethoprim]     Lips edema,dizziness,and shaky sensation.  . Shellfish Allergy     Medications: Outpatient Encounter Medications as of 08/07/2020  Medication Sig Note  . acetaminophen (TYLENOL) 650 MG CR tablet Take 650 mg by mouth every 8 (eight) hours as needed for pain (pain).  07/18/2014: Pt stated she "threw up" medication this past week.  Marland Kitchen allopurinol (ZYLOPRIM) 100 MG tablet TAKE 1 TABLET(100 MG) BY MOUTH TWICE DAILY   . amLODipine (NORVASC) 5 MG tablet TAKE 1 TABLET(5 MG) BY MOUTH DAILY   . aspirin 81 MG EC tablet Take 81 mg by mouth daily.   07/18/2014: Pt stated she "threw up" medication this past week.   . calcitRIOL (ROCALTROL) 0.25 MCG capsule TAKE ONE CAPSULE BY MOUTH EVERY OTHER DAY   . cetirizine (ZYRTEC) 10 MG tablet Take 10 mg by mouth daily.   07/18/2014: Pt stated she "threw up"  medication this past week.   . furosemide (LASIX) 40 MG tablet TAKE 1 TABLET(40 MG) BY MOUTH DAILY   . levothyroxine (SYNTHROID) 50 MCG tablet TAKE 1 TABLET BY MOUTH ONCE DAILY THEN 1/2 TABLET THE NEXT DAY ROTATING   . potassium chloride SA (KLOR-CON) 20 MEQ tablet TAKE 1 TABLET(20 MEQ) BY MOUTH DAILY   . simvastatin (ZOCOR) 20 MG tablet TAKE 1 TABLET BY MOUTH EVERY NIGHT AT BEDTIME    No facility-administered encounter medications on file as of 08/07/2020.    Current Diagnosis/Assessment:    Goals Addressed            This Visit's Progress   . Chronic Care Management       CARE PLAN ENTRY (see longitudinal plan of care for additional care plan information)  Current Barriers:  . Chronic Disease Management support, education, and care coordination needs related to Hypertension, Hyperlipidemia, Atrial Fibrillation, Asthma, Chronic Kidney Disease, Hypothyroidism, Osteoarthritis, Gout, Allergic Rhinitis and Secondary Hypoparathyroidism    Hypertension BP Readings from Last 3 Encounters:  03/27/20 130/78  03/24/19 120/72  03/18/18 124/82   . Pharmacist Clinical Goal(s): o Over the next 60 days, patient will work with PharmD and providers to maintain BP goal <130/80 . Current regimen:  . Amlodipine 5 mg daily  . Furosemide 40 mg daily . Interventions: o Discussed low salt diet and exercising as tolerated extensively . Patient self care activities - Over the next 60 days, patient will: o Check blood pressure once weekly, document, and provide at future appointments o Ensure daily salt intake < 2300 mg/day  Hyperlipidemia Lab Results  Component Value Date/Time  [place] 104 (H) 03/27/2020 12:21 PM   LDLDIRECT 91.4 02/03/2010 10:22 AM   . Pharmacist Clinical Goal(s): o Over the next 180 days, patient will work with PharmD and providers to achieve LDL goal < 100 . Current regimen:  o Simvastatin 20 mg daily  . Interventions: o Discussed low cholesterol diet and exercising as  tolerated extensively o Lowering cholesterol through diet by: Marland Kitchen Limiting foods with cholesterol such as liver and other organ meats, egg yolks, shrimp, and whole milk dairy products . Avoiding saturated fats and trans fats and incorporating healthier fats, such as lean meat, nuts, and unsaturated oils like canola and olive oils . Eating foods with soluble fiber such as whole-grain cereals such as oatmeal and oat bran, fruits such as apples, bananas, oranges, pears, and prunes, legumes such as kidney beans, lentils, chick peas, black-eyed peas, and lima beans, and green leafy vegetables . Limiting alcohol intake   Medication management . Pharmacist Clinical Goal(s): o Over the next 180 days, patient will work with PharmD and providers to maintain optimal medication adherence . Current pharmacy: [facility] . Interventions o Comprehensive medication review performed. o Continue current medication management strategy . Patient self care activities - Over the next 180 days, patient will: o Take medications as prescribed o Report any questions or concerns to PharmD and/or provider(s)       Asthma   Last spirometry score: n/a  Eosinophil count:   Lab Results  Component Value Date/Time   EOSPCT 7.0 (H) 06/18/2016 08:49 AM  %                               Eos (Absolute):  Lab Results  Component Value Date/Time   EOSABS 0.6 06/18/2016 08:49 AM    Tobacco Status:  Social History   Tobacco Use  Smoking Status Never Smoker  Smokeless Tobacco Never Used    Patient has failed these meds in past: Albuterol ("allergic")  Patient is currently controlled on the following medications:   . None  Using maintenance inhaler regularly? No Frequency of rescue inhaler use:  never  We discussed: shortness of breath driven primarily by allergy exacerbations.  Plan  Continue control with diet and exercise  Hypertension   BP goal is:  <130/80  Office blood pressures are  BP Readings  from Last 3 Encounters:  03/27/20 130/78  03/24/19 120/72  03/18/18 124/82   CMP Latest Ref Rng & Units 03/27/2020 03/28/2019 03/24/2019  Glucose 70 - 99 mg/dL - - -  BUN 6 - 23 mg/dL - - -  Creatinine 0.40 - 1.20 mg/dL - - -  Sodium 135 - 145 mEq/L - - -  Potassium 3.5 - 5.1 mEq/L - 3.8 -  Chloride 96 - 112 mEq/L - - -  CO2 19 - 32 mEq/L - - -  Calcium 8.4 - 10.5 mg/dL - - -  Total Protein 6.1 - 8.1 g/dL 7.2 - 7.1  Total Bilirubin 0.2 - 1.2 mg/dL 0.7 - 0.8  Alkaline Phos 39 - 117 U/L - - 82  AST 10 - 35 U/L 24 - 18  ALT 6 - 29 U/L 15 - 12   Patient checks BP at home infrequently Patient home BP readings are ranging: n/a  Patient has failed these meds in the past: n/a Patient is currently controlled on the following medications:  . Amlodipine 5 mg daily  . Furosemide 40 mg daily   We discussed diet  and exercise extensively.  -Importance of checking blood pressure regularly - recommended picking a day of the week and setting a timer to remember to check -Exercise: works out with a trainer once a week -Follows a low salt diet and eats out once a week  Plan BP assessment in 2 months. Continue current medications   Hyperlipidemia   LDL goal < 100  Lipid Panel     Component Value Date/Time   CHOL 193 03/27/2020 1221   TRIG 105 03/27/2020 1221   HDL 69 03/27/2020 1221   LDLCALC 104 (H) 03/27/2020 1221   LDLDIRECT 91.4 02/03/2010 1022    Hepatic Function Latest Ref Rng & Units 03/27/2020 03/24/2019 03/18/2018  Total Protein 6.1 - 8.1 g/dL 7.2 7.1 7.0  Albumin 3.5 - 5.2 g/dL - 4.6 4.6  AST 10 - 35 U/L _0 ALT 6 - 29 U/L _1 Alk Phosphatase 39 - 117 U/L - 82 82  Total Bilirubin 0.2 - 1.2 mg/dL 0.7 0.8 0.8  Bilirubin, Direct 0.0 - 0.2 mg/dL 0.2 0.1 0.1     The ASCVD Risk score ([place]., et al., 2013) failed to calculate for the following reasons:   The 2013 ASCVD risk score is only valid for ages 31 to 78   Patient has failed these meds in past:  n/a Patient is currently uncontrolled on the following medications:  . Simvastatin 20 mg QHS   We discussed:  diet and exercise extensively  -Lowering cholesterol through diet by: Marland Kitchen Limiting foods with cholesterol such as liver and other organ meats, egg yolks, shrimp, and whole milk dairy products . Avoiding saturated fats and trans fats and incorporating healthier fats, such as lean meat, nuts, and unsaturated oils like canola and olive oils . Eating foods with soluble fiber such as whole-grain cereals such as oatmeal and oat bran, fruits such as apples, bananas, oranges, pears, and prunes, legumes such as kidney beans, lentils, chick peas, black-eyed peas, and lima beans, and green leafy vegetables . Limiting alcohol intake   Plan  Continue current medications and control with diet and exercise  Hypothyroidism   Lab Results  Component Value Date/Time   TSH 3.03 03/24/2019 12:39 PM   TSH 2.73 03/18/2018 12:54 PM    Patient has failed these meds in past: n/a Patient is currently controlled on the following medications:  . Levothyroxine 50 mcg and 25 mcg alternating  We discussed: Separates from food / other medications by at least 30 minutes.  Plan  Continue current medications    Allergic Rhinitis    Patient has failed these meds in past: n/a Patient is currently controlled on the following medications:  . Cetirizine 10 mg daily   We discussed:  Allergy symptoms   Plan  Continue current medications  Gout   Uric Acid, Serum  Date Value Ref Range Status  01/19/2008 2.5 2.4 - 7.0 mg/dL Final     Goal Uric Acid < 6 mg/dL   Medications that may increase uric acid levels: Loop Diuretics   Last gout flare: "Very long time"   Patient has failed these meds in past: n/a Patient is currently controlled on the following medications:  . Allopurinol 100 mg BID   We discussed: Patient avoids alcohol.   Plan Recommend repeat uric acid level. Continue current  medications   Misc / OTC   Last vitamin D No results found for: [place], [place], VD25OH  . APAP 650 mg CR q8hr PRN (when  doing physical activity)  . Aspirin 81 mg daily  . Calcitriol 0.25 mcg every other day  . Tums 500 mg daily  . Potassium Chloride 20 mEq daily   We discussed: verifying that her vitamin D level is getting checked through prescriber of calcitriol  Plan  Continue current medications   Vaccines   Reviewed and discussed patient's vaccination history.    Immunization History  Administered Date(s) Administered  . Fluad Quad(high Dose 65+) 03/24/2019, 03/27/2020  . Influenza Split 03/15/2011, 03/17/2012, 04/17/2013  . Influenza Whole 07/06/2004, 04/05/2009, 02/27/2010  . Influenza, High Dose Seasonal PF 02/28/2016, 03/02/2017, 03/18/2018  . Influenza-Unspecified 03/06/2014, 03/07/2015  . PFIZER(Purple Top)SARS-COV-2 Vaccination 07/26/2019, 08/16/2019, 05/10/2020  . Pneumococcal Conjugate-13 05/21/2014  . Pneumococcal Polysaccharide-23 07/06/2001, 01/19/2008  . Td 07/06/2005  . Tdap 03/02/2017  . Zoster 01/19/2008   Verified COVID booster with [facility] and updated immunization history.  Plan  Recommended patient receive shingles vaccine at pharmacy.    Medication Management   Patient's preferred pharmacy is:  [facility] [place], [address] AT [address] [place] [address] Phone: [phone] Fax: [phone]  Uses pill box? No - sorted out based on the time of day - morning container in one container and evening in another container Pt endorses 100% compliance  We discussed: Current pharmacy is preferred with insurance plan and patient is satisfied with pharmacy services  Plan  Continue current medication management strategy   Follow up: 6 month phone visit 2 month BP assessment  Jeni Salles, PharmD [place] Pharmacist [place] at  [place]

## 2020-08-13 NOTE — Patient Instructions (Addendum)
Visit Information  Goals Addressed            This Visit's Progress   . Chronic Care Management       CARE PLAN ENTRY (see longitudinal plan of care for additional care plan information)  Current Barriers:  . Chronic Disease Management support, education, and care coordination needs related to Hypertension, Hyperlipidemia, Atrial Fibrillation, Asthma, Chronic Kidney Disease, Hypothyroidism, Osteoarthritis, Gout, Allergic Rhinitis and Secondary Hypoparathyroidism    Hypertension BP Readings from Last 3 Encounters:  03/27/20 130/78  03/24/19 120/72  03/18/18 124/82   . Pharmacist Clinical Goal(s): o Over the next 60 days, patient will work with PharmD and providers to maintain BP goal <130/80 . Current regimen:  . Amlodipine 5 mg daily  . Furosemide 40 mg daily . Interventions: o Discussed low salt diet and exercising as tolerated extensively . Patient self care activities - Over the next 60 days, patient will: o Check blood pressure once weekly, document, and provide at future appointments o Ensure daily salt intake < 2300 mg/day  Hyperlipidemia Lab Results  Component Value Date/Time   LDLCALC 104 (H) 03/27/2020 12:21 PM   LDLDIRECT 91.4 02/03/2010 10:22 AM   . Pharmacist Clinical Goal(s): o Over the next 180 days, patient will work with PharmD and providers to achieve LDL goal < 100 . Current regimen:  o Simvastatin 20 mg daily  . Interventions: o Discussed low cholesterol diet and exercising as tolerated extensively o Lowering cholesterol through diet by: Marland Kitchen Limiting foods with cholesterol such as liver and other organ meats, egg yolks, shrimp, and whole milk dairy products . Avoiding saturated fats and trans fats and incorporating healthier fats, such as lean meat, nuts, and unsaturated oils like canola and olive oils . Eating foods with soluble fiber such as whole-grain cereals such as oatmeal and oat bran, fruits such as apples, bananas, oranges, pears, and prunes,  legumes such as kidney beans, lentils, chick peas, black-eyed peas, and lima beans, and green leafy vegetables . Limiting alcohol intake   Medication management . Pharmacist Clinical Goal(s): o Over the next 180 days, patient will work with PharmD and providers to maintain optimal medication adherence . Current pharmacy: [facility] . Interventions o Comprehensive medication review performed. o Continue current medication management strategy . Patient self care activities - Over the next 180 days, patient will: o Take medications as prescribed o Report any questions or concerns to PharmD and/or provider(s)        The patient verbalized understanding of instructions, educational materials, and care plan provided today and declined offer to receive copy of patient instructions, educational materials, and care plan.   Telephone follow up appointment with pharmacy team member scheduled for: 6 months  Viona Gilmore, [place], PharmD [place] Pharmacist [employer] at [place] [phone]

## 2020-10-11 ENCOUNTER — Telehealth: Payer: Self-pay | Admitting: Pharmacist

## 2020-10-11 NOTE — Chronic Care Management (AMB) (Unsigned)
Chronic Care Management Pharmacy Assistant   Name: SHAI RASMUSSEN  MRN: [id number] DOB: [birth year]   Reason for Encounter: Hypertension Adherence Call    Recent office visits:  None  Recent consult visits:  None  Hospital visits:  None in previous 6 months  Medications: Outpatient Encounter Medications as of 10/11/2020  Medication Sig Note  . acetaminophen (TYLENOL) 650 MG CR tablet Take 650 mg by mouth every 8 (eight) hours as needed for pain (pain).  07/18/2014: Pt stated she "threw up" medication this past week.  Marland Kitchen allopurinol (ZYLOPRIM) 100 MG tablet TAKE 1 TABLET(100 MG) BY MOUTH TWICE DAILY   . amLODipine (NORVASC) 5 MG tablet TAKE 1 TABLET(5 MG) BY MOUTH DAILY   . aspirin 81 MG EC tablet Take 81 mg by mouth daily.   07/18/2014: Pt stated she "threw up" medication this past week.   . calcitRIOL (ROCALTROL) 0.25 MCG capsule TAKE ONE CAPSULE BY MOUTH EVERY OTHER DAY   . cetirizine (ZYRTEC) 10 MG tablet Take 10 mg by mouth daily.   07/18/2014: Pt stated she "threw up" medication this past week.   . furosemide (LASIX) 40 MG tablet TAKE 1 TABLET(40 MG) BY MOUTH DAILY   . levothyroxine (SYNTHROID) 50 MCG tablet TAKE 1 TABLET BY MOUTH ONCE DAILY THEN 1/2 TABLET THE NEXT DAY ROTATING   . potassium chloride SA (KLOR-CON) 20 MEQ tablet TAKE 1 TABLET(20 MEQ) BY MOUTH DAILY   . simvastatin (ZOCOR) 20 MG tablet TAKE 1 TABLET BY MOUTH EVERY NIGHT AT BEDTIME    No facility-administered encounter medications on file as of 10/11/2020.    Reviewed chart prior to disease state call. Spoke with patient regarding BP  Recent Office Vitals: BP Readings from Last 3 Encounters:  03/27/20 130/78  03/24/19 120/72  03/18/18 124/82   Pulse Readings from Last 3 Encounters:  03/27/20 100  03/24/19 93  03/18/18 86    Wt Readings from Last 3 Encounters:  03/27/20 200 lb 8 oz (90.9 kg)  03/24/19 191 lb 6 oz (86.8 kg)  03/18/18 192 lb 6 oz (87.3 kg)     Kidney Function Lab Results   Component Value Date/Time   CREATININE 1.42 (H) 04/07/2017 03:20 PM   CREATININE 1.46 (H) 06/18/2016 08:49 AM   GFR 37.69 (L) 04/07/2017 03:20 PM   GFRNONAA 38 (L) 07/26/2014 04:45 AM   GFRAA 44 (L) 07/26/2014 04:45 AM    BMP Latest Ref Rng & Units 03/28/2019 04/07/2017 06/18/2016  Glucose 70 - 99 mg/dL - 129(H) 120(H)  BUN 6 - 23 mg/dL - 26(H) 32(H)  Creatinine 0.40 - 1.20 mg/dL - 1.42(H) 1.46(H)  Sodium 135 - 145 mEq/L - 138 139  Potassium 3.5 - 5.1 mEq/L 3.8 4.1 4.0  Chloride 96 - 112 mEq/L - 99 103  CO2 19 - 32 mEq/L - 25 27  Calcium 8.4 - 10.5 mg/dL - 10.3 9.9    . Current antihypertensive regimen:  ? Check blood pressure once weekly, document, and provide at future appointments ? Ensure daily salt intake < 2300 mg/day  . How often are you checking your Blood Pressure? {CHL HP BP Monitoring Frequency:[phone]}   . Current home BP readings: ***  . What recent interventions/DTPs have been made by any provider to improve Blood Pressure control since last CPP Visit: ***  . Any recent hospitalizations or ED visits since last visit with CPP? No   . What diet changes have been made to improve Blood Pressure Control?  o *** .  What exercise is being done to improve your Blood Pressure Control?  o ***  Adherence Review: Is the patient currently on ACE/ARB medication? No Does the patient have >5 day gap between last estimated fill dates? {yes/no:20286}    Star Rating Drugs: Simvastatin 20 MG: 90 DS, last filled on 08/11/20 at [facility].  Jeni Salles, CPP Notified.   Raynelle Highland, [place] Pharmacist Assistant [phone] CCM Total Time:

## 2020-11-06 DIAGNOSIS — N1832 Chronic kidney disease, stage 3b: Secondary | ICD-10-CM | POA: Diagnosis not present

## 2020-11-06 DIAGNOSIS — E038 Other specified hypothyroidism: Secondary | ICD-10-CM | POA: Diagnosis not present

## 2020-11-06 DIAGNOSIS — N2581 Secondary hyperparathyroidism of renal origin: Secondary | ICD-10-CM | POA: Diagnosis not present

## 2020-11-06 DIAGNOSIS — Z87440 Personal history of urinary (tract) infections: Secondary | ICD-10-CM | POA: Diagnosis not present

## 2020-11-06 DIAGNOSIS — N390 Urinary tract infection, site not specified: Secondary | ICD-10-CM | POA: Diagnosis not present

## 2020-11-06 DIAGNOSIS — N1831 Chronic kidney disease, stage 3a: Secondary | ICD-10-CM | POA: Diagnosis not present

## 2020-11-06 DIAGNOSIS — M109 Gout, unspecified: Secondary | ICD-10-CM | POA: Diagnosis not present

## 2020-11-09 ENCOUNTER — Other Ambulatory Visit: Payer: Self-pay | Admitting: Family Medicine

## 2020-11-09 DIAGNOSIS — M109 Gout, unspecified: Secondary | ICD-10-CM

## 2020-11-09 DIAGNOSIS — E876 Hypokalemia: Secondary | ICD-10-CM

## 2020-11-20 ENCOUNTER — Telehealth: Payer: Self-pay | Admitting: Pharmacist

## 2020-11-20 NOTE — Chronic Care Management (AMB) (Signed)
Chronic Care Management Pharmacy Assistant   Name: [name]  MRN: [id number] DOB: 07/03/1934  Reason for Encounter: Disease State   Conditions to be addressed/monitored: HTN  Recent office visits:  None  Recent consult visits:  None  Hospital visits:  None in previous 6 months  Medications: Outpatient Encounter Medications as of 11/20/2020  Medication Sig Note  . acetaminophen (TYLENOL) 650 MG CR tablet Take 650 mg by mouth every 8 (eight) hours as needed for pain (pain).  07/18/2014: Pt stated she "threw up" medication this past week.  Marland Kitchen allopurinol (ZYLOPRIM) 100 MG tablet TAKE 1 TABLET(100 MG) BY MOUTH TWICE DAILY   . amLODipine (NORVASC) 5 MG tablet TAKE 1 TABLET(5 MG) BY MOUTH DAILY   . aspirin 81 MG EC tablet Take 81 mg by mouth daily.   07/18/2014: Pt stated she "threw up" medication this past week.   . calcitRIOL (ROCALTROL) 0.25 MCG capsule TAKE ONE CAPSULE BY MOUTH EVERY OTHER DAY   . cetirizine (ZYRTEC) 10 MG tablet Take 10 mg by mouth daily.   07/18/2014: Pt stated she "threw up" medication this past week.   . furosemide (LASIX) 40 MG tablet TAKE 1 TABLET(40 MG) BY MOUTH DAILY   . levothyroxine (SYNTHROID) 50 MCG tablet TAKE 1 TABLET BY MOUTH ONCE DAILY THEN 1/2 TABLET THE NEXT DAY ROTATING   . potassium chloride SA (KLOR-CON) 20 MEQ tablet TAKE 1 TABLET(20 MEQ) BY MOUTH DAILY   . simvastatin (ZOCOR) 20 MG tablet TAKE 1 TABLET BY MOUTH EVERY NIGHT AT BEDTIME    No facility-administered encounter medications on file as of 11/20/2020.    Reviewed chart prior to disease state call. Spoke with patient regarding BP  Recent Office Vitals: BP Readings from Last 3 Encounters:  03/27/20 130/78  03/24/19 120/72  03/18/18 124/82   Pulse Readings from Last 3 Encounters:  03/27/20 100  03/24/19 93  03/18/18 86    Wt Readings from Last 3 Encounters:  03/27/20 200 lb 8 oz (90.9 kg)  03/24/19 191 lb 6 oz (86.8 kg)  03/18/18 192 lb 6 oz (87.3 kg)     Kidney  Function Lab Results  Component Value Date/Time   CREATININE 1.42 (H) 04/07/2017 03:20 PM   CREATININE 1.46 (H) 06/18/2016 08:49 AM   GFR 37.69 (L) 04/07/2017 03:20 PM   GFRNONAA 38 (L) 07/26/2014 04:45 AM   GFRAA 44 (L) 07/26/2014 04:45 AM    BMP Latest Ref Rng & Units 03/28/2019 04/07/2017 06/18/2016  Glucose 70 - 99 mg/dL - 129(H) 120(H)  BUN 6 - 23 mg/dL - 26(H) 32(H)  Creatinine 0.40 - 1.20 mg/dL - 1.42(H) 1.46(H)  Sodium 135 - 145 mEq/L - 138 139  Potassium 3.5 - 5.1 mEq/L 3.8 4.1 4.0  Chloride 96 - 112 mEq/L - 99 103  CO2 19 - 32 mEq/L - 25 27  Calcium 8.4 - 10.5 mg/dL - 10.3 9.9   . Current antihypertensive regimen:   Amlodipine 5 mg daily   Furosemide 40 mg daily . How often are you checking your Blood Pressure? infrequently . Current home BP readings:  o 04.09 132/72 o 04.16 128/70 o 04.22 122/70 o 05.05 124/76 . What recent interventions/DTPs have been made by any provider to improve Blood Pressure control since last CPP Visit: None . Any recent hospitalizations or ED visits since last visit with CPP? No . What diet changes have been made to improve Blood Pressure Control?  o None . What exercise is being done to  improve your Blood Pressure Control?  o None  Adherence Review: Is the patient currently on ACE/ARB medication? No Does the patient have >5 day gap between last estimated fill dates? No  I spoke with the patient and discussed medication adherence. She states she is not having any issues with her current medication. She takes her blood pressures infrequently, but they have been within normal range. There have been no changes to her current medication. She continues to take her medication as prescribed. She denies any ED or urgent care visits since our last CPP or PCP visit. She denies any issues with her current pharmacy.  Star Rating Drugs: None  Arlis Porta Revonda Standard, [place] [phone]

## 2020-12-16 ENCOUNTER — Other Ambulatory Visit: Payer: Self-pay | Admitting: Family Medicine

## 2020-12-16 DIAGNOSIS — Z1231 Encounter for screening mammogram for malignant neoplasm of breast: Secondary | ICD-10-CM

## 2021-02-03 ENCOUNTER — Ambulatory Visit (INDEPENDENT_AMBULATORY_CARE_PROVIDER_SITE_OTHER): Payer: Medicare Other | Admitting: Pharmacist

## 2021-02-03 DIAGNOSIS — I4891 Unspecified atrial fibrillation: Secondary | ICD-10-CM

## 2021-02-03 DIAGNOSIS — I10 Essential (primary) hypertension: Secondary | ICD-10-CM | POA: Diagnosis not present

## 2021-02-03 NOTE — Progress Notes (Signed)
Chronic Care Management Pharmacy Note  02/10/2021 Name:  [name] MRN:  [id number] DOB:  1933-09-29  Summary: Pt does not check BP at home LDL not quit at goal < 100  Recommendations/Changes made from today's visit: -Recommended routine BP monitoring at home -Recommend repeat uric acid level as this is overdue  Plan: Follow up BP assessment in 2 months   Subjective: [name] is an 87 y.o. year old female who is a primary patient of Martinique, Malka So, MD.  The CCM team was consulted for assistance with disease management and care coordination needs.    Engaged with patient by telephone for follow up visit in response to provider referral for pharmacy case management and/or care coordination services.   Consent to Services:  The patient was given information about Chronic Care Management services, agreed to services, and gave verbal consent prior to initiation of services.  Please see initial visit note for detailed documentation.   Patient Care Team: Martinique, Betty G, MD as PCP - General (Family Medicine) Viona Gilmore, [facility] as Pharmacist (Pharmacist)  Recent office visits: 03/27/20 Betty Martinique, MD: Patient presented for medicare annual wellness exam. A1c slightly improved to 5.8%.  Recent consult visits: 05/08/20 Madelon Lips (nephrology): Patient presented for office visit. Unable to access notes.   03/06/20 Shon Hough (ophthalmology): Patient presented for eye exam.  Hospital visits: None in previous 6 months   Objective:  Lab Results  Component Value Date   CREATININE 1.42 (H) 04/07/2017   BUN 26 (H) 04/07/2017   GFR 37.69 (L) 04/07/2017   GFRNONAA 38 (L) 07/26/2014   GFRAA 44 (L) 07/26/2014   NA 138 04/07/2017   K 3.8 03/28/2019   CALCIUM 10.3 04/07/2017   CO2 25 04/07/2017   GLUCOSE 129 (H) 04/07/2017    Lab Results  Component Value Date/Time   HGBA1C 5.8 (H) 03/27/2020 12:21 PM   HGBA1C 6.0 03/28/2019 10:20 AM   GFR 37.69 (L)  04/07/2017 03:20 PM   GFR 36.57 (L) 06/18/2016 08:49 AM   MICROALBUR 1.6 03/02/2012 09:49 AM   MICROALBUR 0.7 11/25/2006 09:56 AM    Last diabetic Eye exam:  Lab Results  Component Value Date/Time   HMDIABEYEEXA No Retinopathy 03/02/2019 12:00 AM    Last diabetic Foot exam: No results found for: HMDIABFOOTEX   Lab Results  Component Value Date   CHOL 193 03/27/2020   HDL 69 03/27/2020   LDLCALC 104 (H) 03/27/2020   LDLDIRECT 91.4 02/03/2010   TRIG 105 03/27/2020   CHOLHDL 2.8 03/27/2020    Hepatic Function Latest Ref Rng & Units 03/27/2020 03/24/2019 03/18/2018  Total Protein 6.1 - 8.1 g/dL 7.2 7.1 7.0  Albumin 3.5 - 5.2 g/dL - 4.6 4.6  AST 10 - 35 U/L 24 18 20   ALT 6 - 29 U/L 15 12 16   Alk Phosphatase 39 - 117 U/L - 82 82  Total Bilirubin 0.2 - 1.2 mg/dL 0.7 0.8 0.8  Bilirubin, Direct 0.0 - 0.2 mg/dL 0.2 0.1 0.1    Lab Results  Component Value Date/Time   TSH 3.03 03/24/2019 12:39 PM   TSH 2.73 03/18/2018 12:54 PM    CBC Latest Ref Rng & Units 06/18/2016 03/18/2016 06/17/2015  WBC 4.0 - 10.5 K/uL 9.2 8.8 7.7  Hemoglobin 12.0 - 15.0 g/dL 12.9 13.6 13.3  Hematocrit 36.0 - 46.0 % 38.8 39 41.4  Platelets 150.0 - 400.0 K/uL 262.0 235 285.0    No results found for: VD25OH  Clinical ASCVD: No  The ASCVD Risk score Mikey Bussing DC Jr., et al., 2013) failed to calculate for the following reasons:   The 2013 ASCVD risk score is only valid for ages 54 to 42    Depression screen PHQ 2/9 03/27/2020 03/25/2019 03/18/2018  Decreased Interest 0 0 0  Down, Depressed, Hopeless 0 0 0  PHQ - 2 Score 0 0 0      Social History   Tobacco Use  Smoking Status Never  Smokeless Tobacco Never   BP Readings from Last 3 Encounters:  03/27/20 130/78  03/24/19 120/72  03/18/18 124/82   Pulse Readings from Last 3 Encounters:  03/27/20 100  03/24/19 93  03/18/18 86   Wt Readings from Last 3 Encounters:  03/27/20 200 lb 8 oz (90.9 kg)  03/24/19 191 lb 6 oz (86.8 kg)  03/18/18 192 lb 6 oz  (87.3 kg)   BMI Readings from Last 3 Encounters:  03/27/20 36.67 kg/m  03/24/19 35.00 kg/m  03/18/18 35.19 kg/m    Assessment/Interventions: Review of patient past medical history, allergies, medications, health status, including review of consultants reports, laboratory and other test data, was performed as part of comprehensive evaluation and provision of chronic care management services.   SDOH:  (Social Determinants of Health) assessments and interventions performed: No  SDOH Screenings   Alcohol Screen: Not on file  Depression (PHQ2-9): Low Risk    PHQ-2 Score: 0  Financial Resource Strain: Not on file  Food Insecurity: Not on file  Housing: Not on file  Physical Activity: Not on file  Social Connections: Not on file  Stress: Not on file  Tobacco Use: Low Risk    Smoking Tobacco Use: Never   Smokeless Tobacco Use: Never  Transportation Needs: Not on file    CCM Care Plan  Allergies  Allergen Reactions   Drug Ingredient [Sulfamethoxazole-Trimethoprim]     Lips edema,dizziness,and shaky sensation.   Shellfish Allergy     Medications Reviewed Today     Reviewed by Martinique, Betty G, MD (Physician) on 03/27/20 at 1217  Med List Status: <None>   Medication Order Taking? Sig Documenting Provider Last Dose Status Informant  acetaminophen (TYLENOL) 650 MG CR tablet [id number] Yes Take 650 mg by mouth every 8 (eight) hours as needed for pain (pain).  [provider] Taking Active Self           Med Note Jimmey Ralph, [facility] I   Wed Jul 18, 2014  2:30 PM) Pt stated she "threw up" medication this past week.  allopurinol (ZYLOPRIM) 100 MG tablet [id number] Yes TAKE 1 TABLET(100 MG) BY MOUTH TWICE DAILY Martinique, Betty G, MD Taking Active   amLODipine (NORVASC) 5 MG tablet [id number] Yes TAKE 1 TABLET(5 MG) BY MOUTH DAILY Martinique, Betty G, MD Taking Active   aspirin 81 MG EC tablet [id number] Yes Take 81 mg by mouth daily.   [provider] Taking Active Self            Med Note Jimmey Ralph, [facility] I   Wed Jul 18, 2014  2:31 PM) Pt stated she "threw up" medication this past week.   calcitRIOL (ROCALTROL) 0.25 MCG capsule [id number] Yes Take 1 capsule (0.25 mcg total) by mouth every other day. Martinique, Betty G, MD Taking Active   cetirizine (ZYRTEC) 10 MG tablet [id number] Yes Take 10 mg by mouth daily.   [provider] Taking Active Self           Med Note Jimmey Ralph, [facility] I   Wed Jul 18, 2014  2:31 PM) Pt stated she "threw up" medication this past week.   furosemide (LASIX) 40 MG tablet [id number] Yes TAKE 1 TABLET(40 MG) BY MOUTH DAILY Martinique, Betty G, MD Taking Active   levothyroxine (SYNTHROID) 50 MCG tablet [id number] Yes TAKE 1 TABLET BY MOUTH ONCE DAILY THEN 1/2 TABLET THE NEXT DAY ROTATING Martinique, Betty G, MD Taking Active   potassium chloride SA (KLOR-CON) 20 MEQ tablet [id number] Yes TAKE 1 TABLET(20 MEQ) BY MOUTH DAILY Martinique, Betty G, MD Taking Active   simvastatin (ZOCOR) 20 MG tablet [id number] Yes TAKE 1 TABLET BY MOUTH EVERY NIGHT AT BEDTIME Martinique, Betty G, MD Taking Active             Patient Active Problem List   Diagnosis Date Noted   Secondary hypoparathyroidism ([place]) 03/24/2019   Hot flashes not due to menopause 02/13/2016   Abdominal pain    Abnormal x-ray of abdomen    Mitral regurgitation 07/22/2014   Sinus pause 07/20/2014   Atrial fibrillation with controlled ventricular response ([place]) 07/20/2014   SBO (small bowel obstruction) ([place]) 07/18/2014   CKD (chronic kidney disease) stage 3, GFR 30-59 ml/min ([place]) 07/18/2014   Small bowel obstruction ([place]) 07/18/2014   Acute on chronic renal failure ([place]) 07/18/2014   Asthma exacerbation, non-allergic 07/03/2011   COUGH 02/10/2010   Hypothyroidism 02/08/2009   Hyperlipidemia 01/19/2008   Gouty arthropathy 01/19/2008   DYSPLASTIC NEVUS, BACK 06/17/2007   Essential hypertension 12/02/2006   Allergic rhinitis 12/02/2006   Osteoarthritis 12/02/2006     Immunization History  Administered Date(s) Administered   Fluad Quad(high Dose 65+) 03/24/2019, 03/27/2020   Influenza Split 03/15/2011, 03/17/2012, 04/17/2013   Influenza Whole 07/06/2004, 04/05/2009, 02/27/2010   Influenza, High Dose Seasonal PF 02/28/2016, 03/02/2017, 03/18/2018   Influenza-Unspecified 03/06/2014, 03/07/2015   PFIZER(Purple Top)SARS-COV-2 Vaccination 07/26/2019, 08/16/2019, 05/10/2020   Pneumococcal Conjugate-13 05/21/2014   Pneumococcal Polysaccharide-23 07/06/2001, 01/19/2008   Td 07/06/2005   Tdap 03/02/2017   Zoster, Live 01/19/2008    Conditions to be addressed/monitored:  Hypertension, Hyperlipidemia, Atrial Fibrillation, Asthma, Chronic Kidney Disease, Hypothyroidism, Osteoarthritis, Gout, and Allergic Rhinitis  Conditions addressed this visit: Hypertension, gout  Care Plan : CCM Pharmacy Care Plan  Updates made by Viona Gilmore, [place] since 02/10/2021 12:00 AM     Problem: Problem: Hypertension, Hyperlipidemia, Atrial Fibrillation, Asthma, Chronic Kidney Disease, Hypothyroidism, Osteoarthritis, Gout, and Allergic Rhinitis      Long-Range Goal: Patient-Specific Goal   Start Date: 02/03/2021  Expected End Date: 02/03/2022  This Visit's Progress: On track  Priority: High  Note:   Current Barriers:  Unable to independently monitor therapeutic efficacy Unable to achieve control of cholesterol   Pharmacist Clinical Goal(s):  Patient will achieve adherence to monitoring guidelines and medication adherence to achieve therapeutic efficacy through collaboration with PharmD and provider.   Interventions: 1:1 collaboration with Martinique, Betty G, MD regarding development and update of comprehensive plan of care as evidenced by provider attestation and co-signature Inter-disciplinary care team collaboration (see longitudinal plan of care) Comprehensive medication review performed; medication list updated in electronic medical record  Hypertension   (Status:Goal on track: YES.)   Med Management Intervention:  No medication changes  (BP goal <140/90) -Controlled -Current treatment: Amlodipine 5 mg daily Furosemide 40 mg daily -Medications previously tried: none  -Current home readings: does not check at home -Current dietary habits:  follows a low salt diet and eats out once a week -Current exercise habits: works out with a trainer once a week - with  equipment, walks short distances - goes shopping, doesn't use a walker -Denies hypotensive/hypertensive symptoms -Educated on BP goals and benefits of medications for prevention of heart attack, stroke and kidney damage; Exercise goal of 150 minutes per week; Importance of home blood pressure monitoring; Proper BP monitoring technique; -Counseled to monitor BP at home weekly, document, and provide log at future appointments -Counseled on diet and exercise extensively Recommended to continue current medication Recommended picking a day of the week to take it and routinely checking BP  Hyperlipidemia: (LDL goal < 100) -Not ideally controlled -Current treatment: Simvastatin 20 mg at bedtime -Medications previously tried: none  -Current dietary patterns: eats out some but more home cooking -Current exercise habits: personal trainer once a week -Educated on Cholesterol goals;  Importance of limiting foods high in cholesterol; Exercise goal of 150 minutes per week; -Counseled on diet and exercise extensively Recommended to continue current medication  Pre-diabetes (A1c goal <6.5%) -Controlled -Current medications: No medications -Medications previously tried: none  -Current home glucose readings fasting glucose: does not need to check post prandial glucose: does not need to check -Denies hypoglycemic/hyperglycemic symptoms -Current meal patterns:  breakfast: did not discuss this visit  lunch: did not discuss this visit   dinner: did not discuss this visit  snacks: did not  discuss this visit  drinks: did not discuss this visit  -Current exercise: once a week with a trainer -Educated on A1c and blood sugar goals; Exercise goal of 150 minutes per week; Carbohydrate counting and/or plate method -Counseled to check feet daily and get yearly eye exams -Counseled on diet and exercise extensively   Atrial Fibrillation (Goal: prevent stroke and major bleeding) -Not ideally controlled -CHADSVASC: 5 -Current treatment: Rate control: none Anticoagulation: aspirin 81 mg daily -Medications previously tried: n/a -Home BP and HR readings: does not check  -Counseled on avoidance of NSAIDs due to increased bleeding risk with anticoagulants; -Recommended to continue current medication  Asthma (Goal: control symptoms) -Not ideally controlled -Current treatment  No medications -Medications previously tried: inhalers made her sick  -Pulmonary function testing: n/a -Patient denies consistent use of maintenance inhaler -Frequency of rescue inhaler use: none -Counseled on Benefits of consistent maintenance inhaler use -Recommended to continue as is  Hypothyroidism (Goal: 0.35-4.5) -Controlled -Current treatment  Levothyroxine 50 mcg and 25 mcg alternating -Medications previously tried: none  -Recommended to continue current medication  Allergic rhinitis (Goal: minimize symptoms) -Controlled -Current treatment  Cetirizine 10 mg daily -Medications previously tried: none  -Recommended to continue current medication  Gout (Goal: prevent flare ups and uric acid < 6) -Controlled -Current treatment  Allopurinol 100 mg twice daily -Medications previously tried: none  -Counseled on foods that can increase the risk for gout flare ups Recommended repeat uric acid level   Health Maintenance -Vaccine gaps: shingrix (hesitated with side effects), COVID booster (needs to do it) -Current therapy:  APAP 650 mg CR every 8 hours as needed Aspirin 81 mg  daily Calcitriol 0.25 mcg every other day Tums 500 mg daily Potassium Chloride 20 mEq daily  -Educated on Cost vs benefit of each product must be carefully weighed by individual consumer -Patient is satisfied with current therapy and denies issues -Recommended for patient to make sure she is getting vitamin D checked with nephrology.  Patient Goals/Self-Care Activities Patient will:  - take medications as prescribed check blood pressure weekly, document, and provide at future appointments target a minimum of 150 minutes of moderate intensity exercise weekly  Follow Up Plan: Telephone  follow up appointment with care management team member scheduled for:        Medication Assistance: None required.  Patient affirms current coverage meets needs.  Compliance/Adherence/Medication fill history: Care Gaps: Shingrix, COVID booster, influenza   Star-Rating Drugs: Simvastatin 20 mg - last filled 11/09/20 for 90 ds [facility]  Patient's preferred pharmacy is:  [facility], [address] AT [address] [place] [address] Phone: [phone] Fax: [phone]  Uses pill box? Yes Pt endorses 100% compliance  We discussed: Current pharmacy is preferred with insurance plan and patient is satisfied with pharmacy services Patient decided to: Continue current medication management strategy  Care Plan and Follow Up Patient Decision:  Patient agrees to Care Plan and Follow-up.  Plan: Telephone follow up appointment with care management team member scheduled for:  8 months  Jeni Salles, PharmD, [place] Pharmacist [place] at [place] [phone]

## 2021-02-10 ENCOUNTER — Other Ambulatory Visit: Payer: Self-pay

## 2021-02-10 DIAGNOSIS — I10 Essential (primary) hypertension: Secondary | ICD-10-CM

## 2021-02-10 DIAGNOSIS — E785 Hyperlipidemia, unspecified: Secondary | ICD-10-CM

## 2021-02-10 MED ORDER — AMLODIPINE BESYLATE 5 MG PO TABS: 5.0000 mg | ORAL_TABLET | Freq: Every day | ORAL | 3 refills | Status: DC

## 2021-02-10 MED ORDER — SIMVASTATIN 20 MG PO TABS: 20.0000 mg | ORAL_TABLET | Freq: Every day | ORAL | 3 refills | Status: DC

## 2021-02-12 ENCOUNTER — Other Ambulatory Visit: Payer: Self-pay

## 2021-02-12 ENCOUNTER — Ambulatory Visit
Admission: RE | Admit: 2021-02-12 | Discharge: 2021-02-12 | Disposition: A | Discharge status: Home / Self Care | Payer: Medicare Other | Source: Ambulatory Visit | Attending: Family Medicine | Admitting: Family Medicine

## 2021-02-12 DIAGNOSIS — Z1231 Encounter for screening mammogram for malignant neoplasm of breast: Secondary | ICD-10-CM | POA: Diagnosis not present

## 2021-02-19 ENCOUNTER — Telehealth: Payer: Self-pay | Admitting: Family Medicine

## 2021-02-19 NOTE — Telephone Encounter (Signed)
Left message for patient to call back and schedule Medicare Annual Wellness Visit (AWV) either virtually or in office.  Left both  my jabber number [phone] and office number    Last AWV 03/27/20 please schedule at anytime with [facility] Nurse Health Advisor 1 or 2   This should be a 45 minute visit.  

## 2021-03-27 DIAGNOSIS — R7303 Prediabetes: Secondary | ICD-10-CM | POA: Diagnosis not present

## 2021-03-27 DIAGNOSIS — H524 Presbyopia: Secondary | ICD-10-CM | POA: Diagnosis not present

## 2021-03-27 DIAGNOSIS — H353122 Nonexudative age-related macular degeneration, left eye, intermediate dry stage: Secondary | ICD-10-CM | POA: Diagnosis not present

## 2021-03-27 DIAGNOSIS — H35373 Puckering of macula, bilateral: Secondary | ICD-10-CM | POA: Diagnosis not present

## 2021-04-02 ENCOUNTER — Encounter: Payer: Medicare Other | Admitting: Family Medicine

## 2021-04-23 ENCOUNTER — Other Ambulatory Visit: Payer: Self-pay

## 2021-04-23 ENCOUNTER — Encounter: Payer: Self-pay | Admitting: Family Medicine

## 2021-04-23 ENCOUNTER — Ambulatory Visit (INDEPENDENT_AMBULATORY_CARE_PROVIDER_SITE_OTHER): Payer: Medicare Other | Admitting: Family Medicine

## 2021-04-23 VITALS — BP 130/80 | HR 94 | Resp 16 | Ht 62.0 in | Wt 203.0 lb

## 2021-04-23 DIAGNOSIS — I4891 Unspecified atrial fibrillation: Secondary | ICD-10-CM | POA: Diagnosis not present

## 2021-04-23 DIAGNOSIS — E208 Other hypoparathyroidism: Secondary | ICD-10-CM

## 2021-04-23 DIAGNOSIS — E039 Hypothyroidism, unspecified: Secondary | ICD-10-CM | POA: Diagnosis not present

## 2021-04-23 DIAGNOSIS — R251 Tremor, unspecified: Secondary | ICD-10-CM

## 2021-04-23 DIAGNOSIS — N1831 Chronic kidney disease, stage 3a: Secondary | ICD-10-CM | POA: Diagnosis not present

## 2021-04-23 DIAGNOSIS — M109 Gout, unspecified: Secondary | ICD-10-CM

## 2021-04-23 DIAGNOSIS — E785 Hyperlipidemia, unspecified: Secondary | ICD-10-CM

## 2021-04-23 DIAGNOSIS — L821 Other seborrheic keratosis: Secondary | ICD-10-CM

## 2021-04-23 DIAGNOSIS — R7303 Prediabetes: Secondary | ICD-10-CM

## 2021-04-23 DIAGNOSIS — I10 Essential (primary) hypertension: Secondary | ICD-10-CM | POA: Diagnosis not present

## 2021-04-23 DIAGNOSIS — Z0000 Encounter for general adult medical examination without abnormal findings: Secondary | ICD-10-CM | POA: Diagnosis not present

## 2021-04-23 DIAGNOSIS — Z23 Encounter for immunization: Secondary | ICD-10-CM | POA: Diagnosis not present

## 2021-04-23 LAB — COMPREHENSIVE METABOLIC PANEL
ALT: 12 U/L (ref 0–35)
AST: 18 U/L (ref 0–37)
Albumin: 4.6 g/dL (ref 3.5–5.2)
Alkaline Phosphatase: 87 U/L (ref 39–117)
BUN: 20 mg/dL (ref 6–23)
CO2: 27 mEq/L (ref 19–32)
Calcium: 9.9 mg/dL (ref 8.4–10.5)
Chloride: 100 mEq/L (ref 96–112)
Creatinine, Ser: 1.12 mg/dL (ref 0.40–1.20)
GFR: 44.82 mL/min — ABNORMAL LOW (ref >60.00)
Glucose, Bld: 126 mg/dL — ABNORMAL HIGH (ref 70–99)
Potassium: 3.6 mEq/L (ref 3.5–5.1)
Sodium: 138 mEq/L (ref 135–145)
Total Bilirubin: 0.7 mg/dL (ref 0.2–1.2)
Total Protein: 7.7 g/dL (ref 6.0–8.3)

## 2021-04-23 LAB — LIPID PANEL
Cholesterol: 172 mg/dL (ref 0–200)
HDL: 70.7 mg/dL (ref >39.00)
LDL Cholesterol: 83 mg/dL (ref 0–99)
NonHDL: 101.44
Total CHOL/HDL Ratio: 2
Triglycerides: 91 mg/dL (ref 0.0–149.0)
VLDL: 18.2 mg/dL (ref 0.0–40.0)

## 2021-04-23 LAB — HEMOGLOBIN A1C: Hgb A1c MFr Bld: 6 % (ref 4.6–6.5)

## 2021-04-23 LAB — TSH: TSH: 3.93 u[IU]/mL (ref 0.35–5.50)

## 2021-04-23 LAB — T4, FREE: Free T4: 1.05 ng/dL (ref 0.60–1.60)

## 2021-04-23 LAB — URIC ACID: Uric Acid, Serum: 3 mg/dL (ref 2.4–7.0)

## 2021-04-23 NOTE — Assessment & Plan Note (Addendum)
BP adequately controlled. Continue current management: Amlodipine 5 mg daily. DASH/low salt diet to continue. Recommedn monitoring BP at home.

## 2021-04-23 NOTE — Progress Notes (Signed)
HPI: [name] is a 87 y.o. female, who is here today for her AWV and follow up on chronic conditions.  Last AWV: 03/27/20  She lives alone. Her husband died 7 years ago. Her children call her daily. Independent ADL's and IADL's. No falls in the past year and denies depression symptoms.  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 She is active with chores around the house. She gets prep foods from store: salads,banana with peanut battler,soups. She eats out once per week but has left overs for 2 more days.  Functional Status Survey: Is the patient deaf or have difficulty hearing?: No Does the patient have difficulty seeing, even when wearing glasses/contacts?: No Does the patient have difficulty concentrating, remembering, or making decisions?: No Does the patient have difficulty walking or climbing stairs?: No Does the patient have difficulty dressing or bathing?: No Does the patient have difficulty doing errands alone such as visiting a doctor's office or shopping?: No  Fall Risk  04/23/2021 03/30/2020 03/25/2019 03/18/2018 03/02/2017  Falls in the past year? 0 1 0 No No  Comment - - - - -  Number falls in past yr: 0 0 0 - -  Injury with Fall? 0 0 0 - -  Risk for fall due to : Impaired balance/gait - - - -  Follow up Education provided Education provided Education provided - -   Providers she sees regularly: Eye care provider: Dr Kathrin Penner. Nephrologist: Dr Gery Pray and following every 8 months now.  Depression screen PHQ 2/9 04/23/2021  Decreased Interest 0  Down, Depressed, Hopeless 0  PHQ - 2 Score 0   Vision Screening - Comments:: Recently saw eye provider for exam &amp; new glasses.   Mini-Cog - 04/23/21 1114     Normal clock drawing test? yes    How many words correct? 3            Immunization History  Administered Date(s) Administered   Fluad Quad(high Dose 65+) 03/24/2019, 03/27/2020, 04/23/2021   Influenza Split 03/15/2011, 03/17/2012,  04/17/2013   Influenza Whole 07/06/2004, 04/05/2009, 02/27/2010   Influenza, High Dose Seasonal PF 02/28/2016, 03/02/2017, 03/18/2018   Influenza-Unspecified 03/06/2014, 03/07/2015   PFIZER(Purple Top)SARS-COV-2 Vaccination 07/26/2019, 08/16/2019, 05/10/2020   Pneumococcal Conjugate-13 05/21/2014   Pneumococcal Polysaccharide-23 07/06/2001, 01/19/2008   Td 07/06/2005   Tdap 03/02/2017   Zoster, Live 01/19/2008   CKD III and secondary hyperparathyroidism: Follows wit nephrologist q 4-6 months. She has not noted decreased urine output,gross hematuria,or foam in urine.  HTN: She is on Amlodipine 5 mg daily. Furosemide 40 mg daily for LE edema. HypoK+ on KLOR 20 meq daily.   Hypothyroidism: She is on Levothyroxine 50 mcg daily. Last TSH in 03/2019 was normal at 3.0. Negative for fatigue,cold/heat intolerance,changes in bowel habits,or wt changes.   Negative for severe/frequent headache, visual changes, chest pain, dyspnea, palpitation,focal weakness, or worsening edema. Hx of episode of atrial fib during hospitalization in 07/2014 as well as episodes of sinus pauses, these was attributed to BB she received IV to treat atrial fib. She is not longer on Aspirin 81 mg daily.  HLD: She is on Simvastatin 20 mg daily. Tolerating medications well. She follows a healthful diet in general.    Component Value Date/Time   CHOL 193 03/27/2020 1221   TRIG 105 03/27/2020 1221   HDL 69 03/27/2020 1221   CHOLHDL 2.8 03/27/2020 1221   VLDL 27.2 03/24/2019 1239   LDLCALC 104 (H) 03/27/2020 1221   LDLDIRECT 91.4  02/03/2010 1022   -Facial lesions, going on for a while. Lesions are getting bigger.Not tender,pruritic,and not easy bleeding. Hx of melanoma on left thigh many years ago. She does not remember when she saw derma last.  Hypothyroidism: Currently she is on Levothyroxine  50 mcg every other day and alternating with 1/2 tab.. Tolerating medication well, no side effects reported. She has not  noted dysphagia, palpitations, abdominal pain, changes in bowel habits, cold/heat intolerance, or abnormal weight loss.  -Chin tremor for the past couple months ago. She has not identified exacerbating or alleviating factors. It seems to be stable. Negative for stridor,cough,or wheezing. No known FHx of tremor.  Lab Results  Component Value Date   TSH 3.03 03/24/2019   Her pharmacy is given her 12 tabs of Levothyroxine.  Hypertension:  Medications:Amlodipine 5 mg daily. BP readings at home:She is not checking. Side effects:None  Negative for unusual or severe headache, visual changes, exertional chest pain, dyspnea,  focal weakness, or worsening edema.  Lab Results  Component Value Date   CREATININE 1.42 (H) 04/07/2017   BUN 26 (H) 04/07/2017   NA 138 04/07/2017   K 3.8 03/28/2019   CL 99 04/07/2017   CO2 25 04/07/2017   LE edema, L>R, she is on Furosemide 40 mg daily.  Hyperlipidemia: Currently on Simvastatin 20 mg daily. Following a low fat diet: yes.. Side effects from medication:None. Lab Results  Component Value Date   CHOL 193 03/27/2020   HDL 69 03/27/2020   LDLCALC 104 (H) 03/27/2020   LDLDIRECT 91.4 02/03/2010   TRIG 105 03/27/2020   CHOLHDL 2.8 03/27/2020   Gout: She has not hd a gout attack in many years. She is on Allopurinol 100 mg 2 tabs daily.  Lab Results  Component Value Date   LABURIC 2.5 01/19/2008   Prediabetes: HgA1C was 5.8 in 03/2020. Negative for polydipsia,polyuria, or polyphagia. No hx of diabetes.  Review of Systems  Constitutional:  Negative for activity change, appetite change and fever.  HENT:  Negative for mouth sores, nosebleeds and sore throat.   Respiratory:  Negative for cough and wheezing.   Gastrointestinal:  Negative for abdominal pain, nausea and vomiting.       Negative for changes in bowel habits.  Genitourinary:  Negative for decreased urine volume, dysuria and hematuria.  Musculoskeletal:  Positive for  arthralgias.  Neurological:  Negative for syncope, facial asymmetry and weakness.   Current Outpatient Medications on File Prior to Visit  Medication Sig Dispense Refill   acetaminophen (TYLENOL) 650 MG CR tablet Take 650 mg by mouth every 8 (eight) hours as needed for pain (pain).      allopurinol (ZYLOPRIM) 100 MG tablet TAKE 1 TABLET(100 MG) BY MOUTH TWICE DAILY 180 tablet 1   amLODipine (NORVASC) 5 MG tablet Take 1 tablet (5 mg total) by mouth daily. 90 tablet 3   aspirin 81 MG EC tablet Take 81 mg by mouth daily.       calcitRIOL (ROCALTROL) 0.25 MCG capsule TAKE ONE CAPSULE BY MOUTH EVERY OTHER DAY 90 capsule 1   cetirizine (ZYRTEC) 10 MG tablet Take 10 mg by mouth daily.       furosemide (LASIX) 40 MG tablet TAKE 1 TABLET(40 MG) BY MOUTH DAILY 90 tablet 1   potassium chloride SA (KLOR-CON) 20 MEQ tablet TAKE 1 TABLET(20 MEQ) BY MOUTH DAILY 90 tablet 3   simvastatin (ZOCOR) 20 MG tablet Take 1 tablet (20 mg total) by mouth at bedtime. 90 tablet 3  No current facility-administered medications on file prior to visit.   Past Medical History:  Diagnosis Date   Allergy    Arthritis    Broken foot    left   Cancer ([place])    ovarian   CKD (chronic kidney disease), stage III (HCC)    Hyperlipidemia    Hypertension    Obesity    Thyroid disease     Past Surgical History:  Procedure Laterality Date   BREAST BIOPSY Left    CATARACT EXTRACTION     CHOLECYSTECTOMY     TOTAL ABDOMINAL HYSTERECTOMY W/ BILATERAL SALPINGOOPHORECTOMY     TOTAL KNEE ARTHROPLASTY      Allergies  Allergen Reactions   Drug Ingredient [Sulfamethoxazole-Trimethoprim]     Lips edema,dizziness,and shaky sensation.   Shellfish Allergy     Family History  Problem Relation Age of Onset   Diabetes Other        fhx   Hypertension Other        fhx   Obesity Other        fhx   Breast cancer Daughter    Breast cancer Cousin     Social History   Socioeconomic History   Marital status: Married     Spouse name: Not on file   Number of children: Not on file   Years of education: Not on file   Highest education level: Not on file  Occupational History   Not on file  Tobacco Use   Smoking status: Never   Smokeless tobacco: Never  Substance and Sexual Activity   Alcohol use: No   Drug use: No   Sexual activity: Never  Other Topics Concern   Not on file  Social History Narrative   Not on file   Social Determinants of Health   Financial Resource Strain: Not on file  Food Insecurity: Not on file  Transportation Needs: Not on file  Physical Activity: Not on file  Stress: Not on file  Social Connections: Not on file   Vitals:   04/23/21 1047  BP: 130/80  Pulse: 94  Resp: 16  SpO2: 94%   Wt Readings from Last 3 Encounters:  04/23/21 203 lb (92.1 kg)  03/27/20 200 lb 8 oz (90.9 kg)  03/24/19 191 lb 6 oz (86.8 kg)   Body mass index is 37.13 kg/m.  Wt Readings from Last 3 Encounters:  04/23/21 203 lb (92.1 kg)  03/27/20 200 lb 8 oz (90.9 kg)  03/24/19 191 lb 6 oz (86.8 kg)   Physical Exam Vitals and nursing note reviewed.  Constitutional:      General: She is not in acute distress.    Appearance: She is well-developed.  HENT:     Head: Normocephalic and atraumatic.     Mouth/Throat:     Mouth: Mucous membranes are moist.     Pharynx: Oropharynx is clear.  Eyes:     Conjunctiva/sclera: Conjunctivae normal.  Neck:   Cardiovascular:     Rate and Rhythm: Normal rate and regular rhythm.     Pulses:          Dorsalis pedis pulses are 2+ on the right side and 2+ on the left side.     Heart sounds: No murmur heard.    Comments: Peri ankle edema L>R Pulmonary:     Effort: Pulmonary effort is normal. No respiratory distress.     Breath sounds: Normal breath sounds.  Abdominal:     Palpations: Abdomen is soft. There is no  hepatomegaly or mass.     Tenderness: There is no abdominal tenderness.  Lymphadenopathy:     Cervical: No cervical adenopathy.  Skin:     General: Skin is warm.     Findings: No erythema or rash.  Neurological:     General: No focal deficit present.     Mental Status: She is alert and oriented to person, place, and time.     Cranial Nerves: No cranial nerve deficit.     Comments: Mild hand tremor, R>L. Lower jaw tremor, more noticeable with mouth open. Gait mildly unstable, not assisted.  Psychiatric:     Comments: Well groomed, good eye contact.   ASSESSMENT AND PLAN:  [name] was here today annual physical examination.  Orders Placed This Encounter  Procedures   Flu Vaccine QUAD High Dose(Fluad)   Comprehensive metabolic panel   Hemoglobin A1c   Lipid panel   TSH   T4, free   Uric acid   Lab Results  Component Value Date   LABURIC 3.0 04/23/2021   Lab Results  Component Value Date   TSH 3.93 04/23/2021   Lab Results  Component Value Date   CHOL 172 04/23/2021   HDL 70.70 04/23/2021   LDLCALC 83 04/23/2021   LDLDIRECT 91.4 02/03/2010   TRIG 91.0 04/23/2021   CHOLHDL 2 04/23/2021   Lab Results  Component Value Date   HGBA1C 6.0 04/23/2021   Lab Results  Component Value Date   ALT 12 04/23/2021   AST 18 04/23/2021   ALKPHOS 87 04/23/2021   BILITOT 0.7 04/23/2021   Medicare annual wellness visit, subsequent We discussed the importance of staying active, physically and mentally, as well as the benefits of a healthy/balance diet. Low impact exercise that involve stretching and strengthing are ideal. Vaccines up to date. We discussed preventive screening for the next 5-10 years, summery of recommendations given in AVS.   Goals      Exercise 3x per week (30 min per time)     Try to walk 10-15 min in place while watching TV.     patient           patient        This is a list of the screening recommended for you and due dates:  Health Maintenance  Topic Date Due   COVID-19 Vaccine (4 - Booster for Pfizer series) 05/09/2021*   Zoster (Shingles) Vaccine (1 of 2)  07/11/2021*   Tetanus Vaccine  03/03/2027   Pneumonia Vaccine  Completed   Flu Shot  Completed   DEXA scan (bone density measurement)  Completed   HPV Vaccine  Aged Out   Urine Protein Check  Discontinued  *Topic was postponed. The date shown is not the original due date.   Fall prevention.  Advance directives and end of life discussed, she has POA and living will.   Atrial fibrillation, unspecified type ([place]) One time even during hospitalization in 07/2014. Not on chronic anticoagulation. Asymptomatic.  Seborrheic keratosis Reassured. Continue monitoring for changes.  Hypothyroidism Problem has been stable, we have not checked TSH in 2 years. Continue Levothyroxine 50 mcg, alternating between 1 and 1/2 tab every other day. Further recommendations according to TSH result.  Essential hypertension BP adequately controlled. Continue current management: Amlodipine 5 mg daily. DASH/low salt diet to continue. Recommedn monitoring BP at home.  Hyperlipidemia Continue Simvastatin 20 mg daily and low fat diet. Further recommendations according to FLP results.  Gouty arthropathy Continue Allopurinol 100 mg  bid and low purine diet. If uric acid still < 4.0, we could try to decrease dose of Allopurinol.  Prediabetes Encouraged a healthy life style for diabetes prevention. Further recommendations will be given according to HgA1C result.  CKD (chronic kidney disease) stage 3, GFR 30-59 ml/min Adequate hydration,low salt diet,and avoidance of NSAID's to continue. Continue following with nephrologist.  Secondary hypoparathyroidism ([place]) On Calcitrol 0.25 mg daily. Following with nephrologist.  Tremor We discussed possible etiologies. ? Essential tremor. She does not have other associated symptoms. We could arrange appt with neuro, she prefers to wait. Monitor for changes or new symptoms.  Need for influenza vaccination -     Flu Vaccine QUAD High Dose(Fluad)  Return in  about 1 year (around 04/23/2022) for AWV and f/u.  Cal Gindlesperger G. [place], MD  [facility]. [place] office.

## 2021-04-23 NOTE — Assessment & Plan Note (Signed)
On Calcitrol 0.25 mg daily. Following with nephrologist.

## 2021-04-23 NOTE — Patient Instructions (Addendum)
[name] , Thank you for taking time to come for your Medicare Wellness Visit. I appreciate your ongoing commitment to your health goals. Please review the following plan we discussed and let me know if I can assist you in the future.   These are the goals we discussed:  Goals      Exercise 3x per week (30 min per time)     Try to walk 10-15 min in place while watching TV.     patient           patient        This is a list of the screening recommended for you and due dates:  Health Maintenance  Topic Date Due   COVID-19 Vaccine (4 - Booster for Pfizer series) 05/09/2021*   Zoster (Shingles) Vaccine (1 of 2) 07/11/2021*   Tetanus Vaccine  03/03/2027   Pneumonia Vaccine  Completed   Flu Shot  Completed   DEXA scan (bone density measurement)  Completed   HPV Vaccine  Aged Out   Urine Protein Check  Discontinued  *Topic was postponed. The date shown is not the original due date.   A few things to remember from today's visit:   Medicare annual wellness visit, subsequent  Need for influenza vaccination - Plan: Flu Vaccine QUAD High Dose(Fluad)  Essential hypertension - Plan: Comprehensive metabolic panel  Hypothyroidism, unspecified type - Plan: TSH, T4, free  Hyperlipidemia, unspecified hyperlipidemia type - Plan: Lipid panel  Prediabetes - Plan: Hemoglobin A1c  Atrial fibrillation, unspecified type ([place]), Chronic  Secondary hypoparathyroidism ([place]), Chronic  Gouty arthropathy - Plan: Uric acid  Seborrheic keratosis  Continue monitoring chin tremor, if worse or new associated symptom, please let me know.  If you need refills please call your pharmacy. Do not use My Chart to request refills or for acute issues that need immediate attention.   Please be sure medication list is accurate. If a new problem present, please set up appointment sooner than planned today.  A few tips:  -As we age balance is not as good as it was, so there is a higher risks for  falls. Please remove small rugs and furniture that is "in your way" and could increase the risk of falls. Stretching exercises may help with fall prevention: Yoga and Tai Chi are some examples. Low impact exercise is better, so you are not very achy the next day.  -Sun screen and avoidance of direct sun light recommended. Caution with dehydration, if working outdoors be sure to drink enough fluids.  - Some medications are not safe as we age, increases the risk of side effects and can potentially interact with other medication you are also taken;  including some of over the counter medications. Be sure to let me know when you start a new medication even if it is a dietary/vitamin supplement.   -Healthy diet low in red meet/animal fat and sugar + regular physical activity is recommended.     Seborrheic Keratosis A seborrheic keratosis is a common, noncancerous (benign) skin growth. These growths are velvety, waxy, rough, tan, brown, or black spots that appear on the skin. These skin growths can be flat or raised, and scaly. What are the causes? The cause of this condition is not known. What increases the risk? You are more likely to develop this condition if you: Have a family history of seborrheic keratosis. Are 50 or older. Are pregnant. Have had estrogen replacement therapy. What are the signs or symptoms? Symptoms of  this condition include growths on the face, chest, shoulders, back, or other areas. These growths: Are usually painless, but may become irritated and itchy. Can be yellow, brown, black, or other colors. Are slightly raised or have a flat surface. Are sometimes rough or wart-like in texture. Are often velvety or waxy on the surface. Are round or oval-shaped. Often occur in groups, but may occur as a single growth. How is this diagnosed? This condition is diagnosed with a medical history and physical exam. A sample of the growth may be tested (skin biopsy). You may need  to see a skin specialist (dermatologist). How is this treated? Treatment is not usually needed for this condition, unless the growths are irritated or bleed often. You may also choose to have the growths removed if you do not like their appearance. Most commonly, these growths are treated with a procedure in which liquid nitrogen is applied to "freeze" off the growth (cryosurgery). They may also be burned off with electricity (electrocautery) or removed by scraping (curettage). Follow these instructions at home: Watch your growth for any changes. Keep all follow-up visits as told by your health care provider. This is important. Do not scratch or pick at the growth or growths. This can cause them to become irritated or infected. Contact a health care provider if: You suddenly have many new growths. Your growth bleeds, itches, or hurts. Your growth suddenly becomes larger or changes color. Summary A seborrheic keratosis is a common, noncancerous (benign) skin growth. Treatment is not usually needed for this condition, unless the growths are irritated or bleed often. Watch your growth for any changes. Contact a health care provider if you suddenly have many new growths or your growth suddenly becomes larger or changes color. Keep all follow-up visits as told by your health care provider. This is important. This information is not intended to replace advice given to you by your health care provider. Make sure you discuss any questions you have with your health care provider. Document Revised: 11/04/2017 Document Reviewed: 11/04/2017 Elsevier Patient Education  2022 [employer].

## 2021-04-23 NOTE — Assessment & Plan Note (Signed)
Problem has been stable, we have not checked TSH in 2 years. Continue Levothyroxine 50 mcg, alternating between 1 and 1/2 tab every other day. Further recommendations according to TSH result.

## 2021-04-23 NOTE — Assessment & Plan Note (Signed)
Continue Simvastatin 20 mg daily and low fat diet. Further recommendations according to FLP results.

## 2021-04-23 NOTE — Assessment & Plan Note (Signed)
Encouraged a healthy life style for diabetes prevention. Further recommendations will be given according to HgA1C result.

## 2021-04-23 NOTE — Assessment & Plan Note (Signed)
Adequate hydration,low salt diet,and avoidance of NSAID's to continue. Continue following with nephrologist.

## 2021-04-23 NOTE — Assessment & Plan Note (Signed)
Continue Allopurinol 100 mg bid and low purine diet. If uric acid still < 4.0, we could try to decrease dose of Allopurinol.

## 2021-04-26 MED ORDER — LEVOTHYROXINE SODIUM 50 MCG PO TABS: ORAL_TABLET | ORAL | 3 refills | Status: DC

## 2021-04-28 ENCOUNTER — Telehealth: Payer: Self-pay | Admitting: Pharmacist

## 2021-04-28 NOTE — Chronic Care Management (AMB) (Signed)
Chronic Care Management Pharmacy Assistant   Name: ICESS BERTONI  MRN: [id number] DOB: 05-Nov-1933  Reason for Encounter: Disease State/ Hypertension Assessment Call.    Conditions to be addressed/monitored: HTN   Recent office visits:  04/23/21 Betty Martinique MD (PCP) - seen for medicare annual wellness exam and other chronic conditions. No medication changes. Follow up in 12 months.   Recent consult visits:  None.  Hospital visits:  None in previous 6 months  Medications: Outpatient Encounter Medications as of 04/28/2021  Medication Sig Note   acetaminophen (TYLENOL) 650 MG CR tablet Take 650 mg by mouth every 8 (eight) hours as needed for pain (pain).  07/18/2014: Pt stated she "threw up" medication this past week.   allopurinol (ZYLOPRIM) 100 MG tablet TAKE 1 TABLET(100 MG) BY MOUTH TWICE DAILY    amLODipine (NORVASC) 5 MG tablet Take 1 tablet (5 mg total) by mouth daily.    aspirin 81 MG EC tablet Take 81 mg by mouth daily.   07/18/2014: Pt stated she "threw up" medication this past week.    calcitRIOL (ROCALTROL) 0.25 MCG capsule TAKE ONE CAPSULE BY MOUTH EVERY OTHER DAY    cetirizine (ZYRTEC) 10 MG tablet Take 10 mg by mouth daily.   07/18/2014: Pt stated she "threw up" medication this past week.    furosemide (LASIX) 40 MG tablet TAKE 1 TABLET(40 MG) BY MOUTH DAILY    levothyroxine (SYNTHROID) 50 MCG tablet TAKE 1 TABLET BY MOUTH ONCE DAILY THEN 1/2 TABLET THE NEXT DAY ROTATING    potassium chloride SA (KLOR-CON) 20 MEQ tablet TAKE 1 TABLET(20 MEQ) BY MOUTH DAILY    simvastatin (ZOCOR) 20 MG tablet Take 1 tablet (20 mg total) by mouth at bedtime.    No facility-administered encounter medications on file as of 04/28/2021.   Fill History: ALLOPURINOL 100MG  TABLETS 02/10/2021 90   CALCITRIOL 0.25MCG CAPSULES 02/10/2021 90   FUROSEMIDE 40MG  TABLETS 02/10/2021 90   LEVOTHYROXINE SODIUM  50 MCG TABS 04/27/2021 90   POTASSIUM CL 20MEQ ER TABLETS 02/10/2021 90    SIMVASTATIN 20MG  TABLETS 02/10/2021 90   AMLODIPINE BESYLATE 5MG  TABLETS 02/10/2021 90   Reviewed chart prior to disease state call. Spoke with patient regarding BP  Recent Office Vitals: BP Readings from Last 3 Encounters:  04/23/21 130/80  03/27/20 130/78  03/24/19 120/72   Pulse Readings from Last 3 Encounters:  04/23/21 94  03/27/20 100  03/24/19 93    Wt Readings from Last 3 Encounters:  04/23/21 203 lb (92.1 kg)  03/27/20 200 lb 8 oz (90.9 kg)  03/24/19 191 lb 6 oz (86.8 kg)     Kidney Function Lab Results  Component Value Date/Time   CREATININE 1.12 04/23/2021 11:46 AM   CREATININE 1.42 (H) 04/07/2017 03:20 PM   GFR 44.82 (L) 04/23/2021 11:46 AM   GFRNONAA 38 (L) 07/26/2014 04:45 AM   GFRAA 44 (L) 07/26/2014 04:45 AM    BMP Latest Ref Rng & Units 04/23/2021 03/28/2019 04/07/2017  Glucose 70 - 99 mg/dL 126(H) - 129(H)  BUN 6 - 23 mg/dL 20 - 26(H)  Creatinine 0.40 - 1.20 mg/dL 1.12 - 1.42(H)  Sodium 135 - 145 mEq/L 138 - 138  Potassium 3.5 - 5.1 mEq/L 3.6 3.8 4.1  Chloride 96 - 112 mEq/L 100 - 99  CO2 19 - 32 mEq/L 27 - 25  Calcium 8.4 - 10.5 mg/dL 9.9 - 10.3    Current antihypertensive regimen:  Amlodipine 5mg  - take 1 tablet by mouth daily.  How often are you checking your Blood Pressure? infrequently Current home BP readings: None to report.  What recent interventions/DTPs have been made by any provider to improve Blood Pressure control since last CPP Visit: None Any recent hospitalizations or ED visits since last visit with CPP? No  Adherence Review: Is the patient currently on ACE/ARB medication? No Does the patient have >5 day gap between last estimated fill dates? No  Notes: Spoke with patient and reviewed all medications as prescribed. Patient reports taking all medications and today her albs for her uric acid came back and her physician decreased her allopurinol to once daily from twice daily. She stated that they will be adding another gout  medication for a little while but she don't remember the name of it. Patient stated she has not been checking her blood pressure at home or keeping a  log. She does have a blood pressure cuff at home and I reminded her to start checking weekly and keeping a log. Patient was agreeable. Patient doe snot add salt to her food. Patient has red meat beef tips once a week when she outs her one time a week. Patient works out with a Clinical research associate once a week and she walks to her mailbox everyday. She also has stairs to get to her bedroom at home and uses them for activity. Patient thanked me for my call.   Care Gaps:  AWV - completed on 04/23/21 Last PCP BP: 130/80 P: 94 on 04/23/21  Star Rating Drugs:  Simvastatin 20mg  - last filled on 02/10/21 at [place] Pharmacist Assistant [phone]

## 2021-04-29 ENCOUNTER — Other Ambulatory Visit: Payer: Self-pay

## 2021-04-29 DIAGNOSIS — M109 Gout, unspecified: Secondary | ICD-10-CM

## 2021-04-29 MED ORDER — COLCHICINE 0.6 MG PO TABS: ORAL_TABLET | ORAL | 0 refills | Status: AC

## 2021-04-29 MED ORDER — ALLOPURINOL 100 MG PO TABS: 100.0000 mg | ORAL_TABLET | Freq: Every day | ORAL | 3 refills | Status: DC

## 2021-04-30 ENCOUNTER — Other Ambulatory Visit: Payer: Self-pay | Admitting: Family Medicine

## 2021-04-30 DIAGNOSIS — E039 Hypothyroidism, unspecified: Secondary | ICD-10-CM

## 2021-05-08 ENCOUNTER — Other Ambulatory Visit: Payer: Self-pay | Admitting: Family Medicine

## 2021-05-08 DIAGNOSIS — N183 Chronic kidney disease, stage 3 unspecified: Secondary | ICD-10-CM

## 2021-05-08 DIAGNOSIS — Z23 Encounter for immunization: Secondary | ICD-10-CM | POA: Diagnosis not present

## 2021-05-08 DIAGNOSIS — E208 Other hypoparathyroidism: Secondary | ICD-10-CM

## 2021-05-09 ENCOUNTER — Telehealth: Payer: Self-pay | Admitting: Family Medicine

## 2021-05-09 NOTE — Telephone Encounter (Signed)
Pt is calling and would like to know how much allopurinol to  take per day and how much colchine to take per day

## 2021-05-09 NOTE — Telephone Encounter (Signed)
I called and spoke with patient. She will take her Allopurinol 100 mg daily. Patient has decided to hold off on using the Colchicine due to her kidney disease. She will call us if she starts to experience any gout symptoms.

## 2021-05-15 NOTE — Telephone Encounter (Addendum)
Pt is calling to let md know she is taking 2 of allopurinol 100 mg daily instead of 1 pill. Pt has gone back to original amount of allopurinol she has been taking for years. Pt has been having pain in one of her toes that started this  pass sat or sun. Please advise. Pt does not want to make an appt.

## 2021-05-16 NOTE — Telephone Encounter (Signed)
It is ok to continue Allopurinol 200 mg daily. If toes pain is worse+ erythema+ edema we may need a short course of prednisone. Thanks, BJ

## 2021-05-16 NOTE — Telephone Encounter (Signed)
I called and spoke with patient. She has been taking the 200 mg of the Allopurinol, she never stopped. The toe pain is getting better, no redness and no swelling. She will let us know if it comes back.

## 2021-07-11 DIAGNOSIS — N390 Urinary tract infection, site not specified: Secondary | ICD-10-CM | POA: Diagnosis not present

## 2021-07-11 DIAGNOSIS — N2581 Secondary hyperparathyroidism of renal origin: Secondary | ICD-10-CM | POA: Diagnosis not present

## 2021-07-11 DIAGNOSIS — M109 Gout, unspecified: Secondary | ICD-10-CM | POA: Diagnosis not present

## 2021-07-11 DIAGNOSIS — N1831 Chronic kidney disease, stage 3a: Secondary | ICD-10-CM | POA: Diagnosis not present

## 2021-07-11 DIAGNOSIS — I129 Hypertensive chronic kidney disease with stage 1 through stage 4 chronic kidney disease, or unspecified chronic kidney disease: Secondary | ICD-10-CM | POA: Diagnosis not present

## 2021-09-04 ENCOUNTER — Telehealth: Payer: Self-pay | Admitting: Pharmacist

## 2021-09-04 NOTE — Chronic Care Management (AMB) (Signed)
? ? ?  Chronic Care Management ?Pharmacy Assistant  ? ?Name: [name]  MRN: [id number] DOB: 03-Nov-1933 ? ?09/08/2021 APPOINTMENT REMINDER ? ? ?Called Jaylie C Dial, No answer, left message of appointment on 09/08/2021 at 1:00 via telephone visit with Jeni Salles, Pharm D. Notified to have all medications, supplements, blood pressure and/or blood sugar logs available during appointment and to return call if need to reschedule. ? ? ?Care Gaps: ?AWV - completed  04/23/21 ?Last BP - 130/80 on 04/23/2021 ?Last A1C - 6.0 on 04/23/2021 ?Shingrix - never done ?Covid booster - overdue ? ?Star Rating Drug: ?Simvastatin 20mg  - last filled on 08/10/2021 at [facility] ? ?Any gaps in medications fill history? No ? ?Gennie Alma CMA  ?Clinical Pharmacist Assistant ?[phone] ? ?

## 2021-09-08 ENCOUNTER — Ambulatory Visit (INDEPENDENT_AMBULATORY_CARE_PROVIDER_SITE_OTHER): Payer: Medicare Other | Admitting: Pharmacist

## 2021-09-08 DIAGNOSIS — M109 Gout, unspecified: Secondary | ICD-10-CM

## 2021-09-08 DIAGNOSIS — I10 Essential (primary) hypertension: Secondary | ICD-10-CM

## 2021-09-08 NOTE — Progress Notes (Cosign Needed)
Chronic Care Management Pharmacy Note  09/08/2021 Name:  [name] MRN:  [id number] DOB:  Sep 10, 1933  Summary: Pt does not check BP at home Uric acid at goal < 6 with repeated level  Recommendations/Changes made from today's visit: -Recommended routine BP monitoring at home  Plan: Follow up BP assessment in 6 months   Subjective: [name] is an 88 y.o. year old female who is a primary patient of [name], [name].  The CCM team was consulted for assistance with disease management and care coordination needs.    Engaged with patient by telephone for follow up visit in response to provider referral for pharmacy case management and/or care coordination services.   Consent to Services:  The patient was given information about Chronic Care Management services, agreed to services, and gave verbal consent prior to initiation of services.  Please see initial visit note for detailed documentation.   Patient Care Team: [name], [name], Schultz as PCP - General (Family Medicine) [name], [facility] as Pharmacist (Pharmacist)  Recent office visits: 04/23/21 Brandy Martinique Schultz (PCP) - seen for medicare annual wellness exam and other chronic conditions. Decreased dose of allopurinol from 100 mg bid to qd and prescribed colchicine 0.6 mg bid for the fist 7-10 days to prevent a gout attack. Follow up in 12 months.   Recent consult visits: 03/27/21 Marygrace Drought (ophthalmology): Patient presented for eye exam.  05/08/20 Madelon Lips (nephrology): Patient presented for office visit. Unable to access notes.  Hospital visits: None in previous 6 months   Objective:  Lab Results  Component Value Date   CREATININE 1.12 04/23/2021   BUN 20 04/23/2021   GFR 44.82 (L) 04/23/2021   GFRNONAA 38 (L) 07/26/2014   GFRAA 44 (L) 07/26/2014   NA 138 04/23/2021   K 3.6 04/23/2021   CALCIUM 9.9 04/23/2021   CO2 27 04/23/2021   GLUCOSE 126 (H) 04/23/2021    Lab Results  Component  Value Date/Time   HGBA1C 6.0 04/23/2021 11:46 AM   HGBA1C 5.8 (H) 03/27/2020 12:21 PM   GFR 44.82 (L) 04/23/2021 11:46 AM   GFR 37.69 (L) 04/07/2017 03:20 PM   MICROALBUR 1.6 03/02/2012 09:49 AM   MICROALBUR 0.7 11/25/2006 09:56 AM    Last diabetic Eye exam:  Lab Results  Component Value Date/Time   HMDIABEYEEXA No Retinopathy 03/02/2019 12:00 AM    Last diabetic Foot exam: No results found for: HMDIABFOOTEX   Lab Results  Component Value Date   CHOL 172 04/23/2021   HDL 70.70 04/23/2021   LDLCALC 83 04/23/2021   LDLDIRECT 91.4 02/03/2010   TRIG 91.0 04/23/2021   CHOLHDL 2 04/23/2021    Hepatic Function Latest Ref Rng & Units 04/23/2021 03/27/2020 03/24/2019  Total Protein 6.0 - 8.3 Schultz/dL 7.7 7.2 7.1  Albumin 3.5 - 5.2 Schultz/dL 4.6 - 4.6  AST 0 - 37 U/L 18 24 18   ALT 0 - 35 U/L 12 15 12   Alk Phosphatase 39 - 117 U/L 87 - 82  Total Bilirubin 0.2 - 1.2 mg/dL 0.7 0.7 0.8  Bilirubin, Direct 0.0 - 0.2 mg/dL - 0.2 0.1    Lab Results  Component Value Date/Time   TSH 3.93 04/23/2021 11:46 AM   TSH 3.03 03/24/2019 12:39 PM   FREET4 1.05 04/23/2021 11:46 AM    CBC Latest Ref Rng & Units 06/18/2016 03/18/2016 06/17/2015  WBC 4.0 - 10.5 K/uL 9.2 8.8 7.7  Hemoglobin 12.0 - 15.0 Schultz/dL 12.9 13.6 13.3  Hematocrit  36.0 - 46.0 % 38.8 39 41.4  Platelets 150.0 - 400.0 K/uL 262.0 235 285.0    No results found for: VD25OH  Clinical ASCVD: No  The ASCVD Risk score (Arnett DK, et al., 2019) failed to calculate for the following reasons:   The 2019 ASCVD risk score is only valid for ages 60 to 57    Depression screen PHQ 2/9 04/23/2021 03/27/2020 03/25/2019  Decreased Interest 0 0 0  Down, Depressed, Hopeless 0 0 0  PHQ - 2 Score 0 0 0      Social History   Tobacco Use  Smoking Status Never  Smokeless Tobacco Never   BP Readings from Last 3 Encounters:  04/23/21 130/80  03/27/20 130/78  03/24/19 120/72   Pulse Readings from Last 3 Encounters:  04/23/21 94  03/27/20 100   03/24/19 93   Wt Readings from Last 3 Encounters:  04/23/21 203 lb (92.1 kg)  03/27/20 200 lb 8 oz (90.9 kg)  03/24/19 191 lb 6 oz (86.8 kg)   BMI Readings from Last 3 Encounters:  04/23/21 37.13 kg/m  03/27/20 36.67 kg/m  03/24/19 35.00 kg/m    Assessment/Interventions: Review of patient past medical history, allergies, medications, health status, including review of consultants reports, laboratory and other test data, was performed as part of comprehensive evaluation and provision of chronic care management services.   SDOH:  (Social Determinants of Health) assessments and interventions performed: No  SDOH Screenings   Alcohol Screen: Not on file  Depression (PHQ2-9): Low Risk    PHQ-2 Score: 0  Financial Resource Strain: Not on file  Food Insecurity: Not on file  Housing: Not on file  Physical Activity: Not on file  Social Connections: Not on file  Stress: Not on file  Tobacco Use: Low Risk    Smoking Tobacco Use: Never   Smokeless Tobacco Use: Never   Passive Exposure: Not on file  Transportation Needs: Not on file    CCM Care Plan  Allergies  Allergen Reactions   Drug Ingredient [Sulfamethoxazole-Trimethoprim]     Lips edema,dizziness,and shaky sensation.   Shellfish Allergy     Medications Reviewed Today     Reviewed by [name], [name], Schultz (Physician) on 04/23/21 at 2214  Med List Status: <None>   Medication Order Taking? Sig Documenting Provider Last Dose Status Informant  acetaminophen (TYLENOL) 650 MG CR tablet [id number] Yes Take 650 mg by mouth every 8 (eight) hours as needed for pain (pain).  Provider, Historical, Schultz Taking Active Self           Med Note Jimmey Ralph, [facility]   Wed Jul 18, 2014  2:30 PM) Pt stated she "threw up" medication this past week.  allopurinol (ZYLOPRIM) 100 MG tablet [id number] Yes TAKE 1 TABLET(100 MG) BY MOUTH TWICE DAILY [name], [name], Schultz Taking Active   amLODipine (NORVASC) 5 MG tablet [id number] Yes Take 1 tablet (5  mg total) by mouth daily. [name], [name], Schultz Taking Active   aspirin 81 MG EC tablet [id number] Yes Take 81 mg by mouth daily.   Provider, Historical, Schultz Taking Active Self           Med Note Jimmey Ralph, [facility] I   Wed Jul 18, 2014  2:31 PM) Pt stated she "threw up" medication this past week.   calcitRIOL (ROCALTROL) 0.25 MCG capsule [id number] Yes TAKE ONE CAPSULE BY MOUTH EVERY OTHER DAY [name], [name], Schultz Taking Active   cetirizine (ZYRTEC) 10 MG tablet [id number] Yes Take 10  mg by mouth daily.   Provider, Historical, Schultz Taking Active Self           Med Note Jimmey Ralph, [facility] I   Wed Jul 18, 2014  2:31 PM) Pt stated she "threw up" medication this past week.   furosemide (LASIX) 40 MG tablet [id number] Yes TAKE 1 TABLET(40 MG) BY MOUTH DAILY [name], [name], Schultz Taking Active   levothyroxine (SYNTHROID) 50 MCG tablet [id number] Yes TAKE 1 TABLET BY MOUTH ONCE DAILY THEN 1/2 TABLET THE NEXT DAY ROTATING [name], [name], Schultz Taking Active   potassium chloride SA (KLOR-CON) 20 MEQ tablet [id number] Yes TAKE 1 TABLET(20 MEQ) BY MOUTH DAILY [name], [name], Schultz Taking Active   simvastatin (ZOCOR) 20 MG tablet [id number] Yes Take 1 tablet (20 mg total) by mouth at bedtime. [name], [name], Schultz Taking Active             Patient Active Problem List   Diagnosis Date Noted   Prediabetes 04/23/2021   Secondary hypoparathyroidism ([place]) 03/24/2019   Hot flashes not due to menopause 02/13/2016   Abdominal pain    Abnormal x-ray of abdomen    Mitral regurgitation 07/22/2014   Sinus pause 07/20/2014   Atrial fibrillation with controlled ventricular response ([place]) 07/20/2014   SBO (small bowel obstruction) ([place]) 07/18/2014   CKD (chronic kidney disease) stage 3, GFR 30-59 ml/min ([place]) 07/18/2014   Small bowel obstruction ([place]) 07/18/2014   Acute on chronic renal failure ([place]) 07/18/2014   Asthma exacerbation, non-allergic 07/03/2011   COUGH 02/10/2010   Hypothyroidism 02/08/2009   Hyperlipidemia  01/19/2008   Gouty arthropathy 01/19/2008   DYSPLASTIC NEVUS, BACK 06/17/2007   Essential hypertension 12/02/2006   Allergic rhinitis 12/02/2006   Osteoarthritis 12/02/2006    Immunization History  Administered Date(s) Administered   Fluad Quad(high Dose 65+) 03/24/2019, 03/27/2020, 04/23/2021   Influenza Split 03/15/2011, 03/17/2012, 04/17/2013   Influenza Whole 07/06/2004, 04/05/2009, 02/27/2010   Influenza, High Dose Seasonal PF 02/28/2016, 03/02/2017, 03/18/2018   Influenza-Unspecified 03/06/2014, 03/07/2015   PFIZER(Purple Top)SARS-COV-2 Vaccination 07/26/2019, 08/16/2019, 05/10/2020   Pfizer Covid-19 Vaccine Bivalent Booster 52yr & up 05/08/2021   Pneumococcal Conjugate-13 05/21/2014   Pneumococcal Polysaccharide-23 07/06/2001, 01/19/2008   Td 07/06/2005   Tdap 03/02/2017   Zoster, Live 01/19/2008   Patient reports she feels like she has gout attacks coming on but they never actually happens. She rarely eats red meat or anything that might bring it on and not much seafood.  Uric acid level was 5.9  Vitamin D was low -   Have been working on income taxes and cars and has been staying busy. She stays busy   When she did keep a record it was pretty close to the office visit. Within a few points - can bring - bring  -prescribe  -will try   BP Readings from Last 3 Encounters:  04/23/21 130/80  03/27/20 130/78  03/24/19 120/72      Conditions to be addressed/monitored:  Hypertension, Hyperlipidemia, Atrial Fibrillation, Asthma, Chronic Kidney Disease, Hypothyroidism, Osteoarthritis, Gout, and Allergic Rhinitis  Conditions addressed this visit: Hypertension, gout  Care Plan : CCM Pharmacy Care Plan  Updates made by PViona Schultz RAdairsince 09/08/2021 12:00 AM     Problem: Problem: Hypertension, Hyperlipidemia, Atrial Fibrillation, Asthma, Chronic Kidney Disease, Hypothyroidism, Osteoarthritis, Gout, and Allergic Rhinitis      Long-Range Goal: Patient-Specific  Goal   Start Date: 02/03/2021  Expected End Date: 02/03/2022  Recent Progress: On track  Priority: High  Note:  Current Barriers:  Unable to independently monitor therapeutic efficacy  Pharmacist Clinical Goal(s):  Patient will achieve adherence to monitoring guidelines and medication adherence to achieve therapeutic efficacy through collaboration with PharmD and provider.   Interventions: 1:1 collaboration with [name], [name], Schultz regarding development and update of comprehensive plan of care as evidenced by provider attestation and co-signature Inter-disciplinary care team collaboration (see longitudinal plan of care) Comprehensive medication review performed; medication list updated in electronic medical record  Hypertension (BP goal <140/90) -Controlled -Current treatment: Amlodipine 5 mg daily - Appropriate, Query effective, Safe, Accessible Furosemide 40 mg daily - Appropriate, Query effective, Safe, Accessible -Medications previously tried: none  -Current home readings: does not check at home (arm cuff)  -Current dietary habits: follows a low salt diet and eats out once a week -Current exercise habits: works out with a trainer once a week - with equipment, walks short distances - goes shopping, doesn't use a walker -Denies hypotensive/hypertensive symptoms -Educated on BP goals and benefits of medications for prevention of heart attack, stroke and kidney damage; Exercise goal of 150 minutes per week; Importance of home blood pressure monitoring; Proper BP monitoring technique; -Counseled to monitor BP at home weekly, document, and provide log at future appointments -Counseled on diet and exercise extensively Recommended to continue current medication Recommended picking a day of the week to take it and routinely checking BP  Hyperlipidemia: (LDL goal < 100) -Controlled -Current treatment: Simvastatin 20 mg at bedtime - Appropriate, Effective, Safe, Accessible -Medications  previously tried: none  -Current dietary patterns: eats out some but more home cooking -Current exercise habits: personal trainer once a week -Educated on Cholesterol goals;  Importance of limiting foods high in cholesterol; Exercise goal of 150 minutes per week; -Counseled on diet and exercise extensively Recommended to continue current medication  Pre-diabetes (A1c goal <6.5%) -Controlled -Current medications: No medications -Medications previously tried: none  -Current home glucose readings fasting glucose: does not need to check post prandial glucose: does not need to check -Denies hypoglycemic/hyperglycemic symptoms -Current meal patterns:  breakfast: did not discuss this visit  lunch: did not discuss this visit   dinner: did not discuss this visit  snacks: did not discuss this visit  drinks: did not discuss this visit  -Current exercise: once a week with a trainer -Educated on A1c and blood sugar goals; Exercise goal of 150 minutes per week; Carbohydrate counting and/or plate method -Counseled to check feet daily and get yearly eye exams -Counseled on diet and exercise extensively  Atrial Fibrillation (Goal: prevent stroke and major bleeding) -Not ideally controlled -CHADSVASC: 5 -Current treatment: Rate control: none Anticoagulation: aspirin 81 mg daily - Query Appropriate, Effective, Safe, Accessible -Medications previously tried: n/a -Home BP and HR readings: does not check  -Counseled on avoidance of NSAIDs due to increased bleeding risk with anticoagulants; -Recommended to continue current medication  Asthma (Goal: control symptoms) -Not ideally controlled -Current treatment  No medications -Medications previously tried: inhalers made her sick  -Pulmonary function testing: n/a -Patient denies consistent use of maintenance inhaler -Frequency of rescue inhaler use: none -Counseled on Benefits of consistent maintenance inhaler use -Recommended to continue as  is  Hypothyroidism (Goal: 0.35-4.5) -Controlled -Current treatment  Levothyroxine 50 mcg and 25 mcg alternating - Appropriate, Effective, Safe, Accessible -Medications previously tried: none  -Recommended to continue current medication  Allergic rhinitis (Goal: minimize symptoms) -Controlled -Current treatment  Cetirizine 10 mg daily - Appropriate, Effective, Safe, Accessible -Medications previously tried: none  -Recommended to continue  current medication  Gout (Goal: prevent flare ups and uric acid < 6) -Controlled -Current treatment  Allopurinol 100 mg once daily - Appropriate, Effective, Safe, Accessible -Medications previously tried: colchicine (concerned about kidney problems)  -Counseled on foods that can increase the risk for gout flare ups   Health Maintenance -Vaccine gaps: shingrix (hesitated with side effects), COVID booster (needs to do it) -Current therapy:  APAP 650 mg CR every 8 hours as needed Aspirin 81 mg daily Calcitriol 0.25 mcg every other day Tums 500 mg daily Potassium Chloride 20 mEq daily  -Educated on Cost vs benefit of each product must be carefully weighed by individual consumer -Patient is satisfied with current therapy and denies issues -Recommended for patient to make sure she is getting vitamin D checked with nephrology.  Patient Goals/Self-Care Activities Patient will:  - take medications as prescribed check blood pressure weekly, document, and provide at future appointments target a minimum of 150 minutes of moderate intensity exercise weekly  Follow Up Plan: Telephone follow up appointment with care management team member scheduled for: 1 year       Medication Assistance: None required.  Patient affirms current coverage meets needs.  Compliance/Adherence/Medication fill history: Care Gaps: Shingrix Last BP - 130/80 on 04/23/2021 Last A1C - 6.0 on 04/23/2021  Star-Rating Drugs: Simvastatin 23m - [facility]Phone: [phone]: [phone]  Uses pill box? Yes Pt endorses 100% compliance  We discussed: Current pharmacy is preferred with insurance plan and patient is satisfied with pharmacy services Patient decided to: Continue current medication management strategy  Care Plan and Follow Up Patient Decision:  Patient agrees to Care Plan and Follow-up.  Plan: Telephone follow up appointment with care management team member scheduled for:  1 year  MJeni Salles PharmD, BWesleyPharmacist LOccidental Petroleumat [phone]

## 2021-10-03 DIAGNOSIS — E785 Hyperlipidemia, unspecified: Secondary | ICD-10-CM

## 2021-10-03 DIAGNOSIS — I10 Essential (primary) hypertension: Secondary | ICD-10-CM

## 2021-10-03 DIAGNOSIS — I4891 Unspecified atrial fibrillation: Secondary | ICD-10-CM | POA: Diagnosis not present

## 2021-10-03 DIAGNOSIS — J45909 Unspecified asthma, uncomplicated: Secondary | ICD-10-CM

## 2021-10-30 ENCOUNTER — Other Ambulatory Visit: Payer: Self-pay | Admitting: Family Medicine

## 2021-10-30 DIAGNOSIS — E876 Hypokalemia: Secondary | ICD-10-CM

## 2022-01-15 ENCOUNTER — Other Ambulatory Visit: Payer: Self-pay | Admitting: Family Medicine

## 2022-01-15 DIAGNOSIS — Z1231 Encounter for screening mammogram for malignant neoplasm of breast: Secondary | ICD-10-CM

## 2022-01-28 ENCOUNTER — Other Ambulatory Visit: Payer: Self-pay

## 2022-01-28 DIAGNOSIS — E785 Hyperlipidemia, unspecified: Secondary | ICD-10-CM

## 2022-01-28 DIAGNOSIS — I10 Essential (primary) hypertension: Secondary | ICD-10-CM

## 2022-01-28 MED ORDER — AMLODIPINE BESYLATE 5 MG PO TABS: 5.0000 mg | ORAL_TABLET | Freq: Every day | ORAL | 3 refills | Status: DC

## 2022-01-28 MED ORDER — SIMVASTATIN 20 MG PO TABS: 20.0000 mg | ORAL_TABLET | Freq: Every day | ORAL | 3 refills | Status: DC

## 2022-02-13 ENCOUNTER — Ambulatory Visit: Payer: Medicare Other

## 2022-02-26 ENCOUNTER — Ambulatory Visit
Admission: RE | Admit: 2022-02-26 | Discharge: 2022-02-26 | Disposition: A | Discharge status: Home / Self Care | Payer: Medicare Other | Source: Ambulatory Visit | Attending: Family Medicine | Admitting: Family Medicine

## 2022-02-26 DIAGNOSIS — Z1231 Encounter for screening mammogram for malignant neoplasm of breast: Secondary | ICD-10-CM | POA: Diagnosis not present

## 2022-03-03 ENCOUNTER — Other Ambulatory Visit: Payer: Self-pay | Admitting: Family Medicine

## 2022-03-03 DIAGNOSIS — R928 Other abnormal and inconclusive findings on diagnostic imaging of breast: Secondary | ICD-10-CM

## 2022-03-12 ENCOUNTER — Telehealth: Payer: Self-pay | Admitting: Pharmacist

## 2022-03-12 NOTE — Chronic Care Management (AMB) (Signed)
Chronic Care Management Pharmacy Assistant   Name: [name]  MRN: [id number] DOB: [birth year]  Reason for Encounter: Disease State / Hypertension Assessment Call   Conditions to be addressed/monitored: HTN  Recent office visits:  None  Recent consult visits:  None  Hospital visits:  None  Medications: Outpatient Encounter Medications as of 03/12/2022  Medication Sig Note   acetaminophen (TYLENOL) 650 MG CR tablet Take 650 mg by mouth every 8 (eight) hours as needed for pain (pain).  07/18/2014: Pt stated she "threw up" medication this past week.   allopurinol (ZYLOPRIM) 100 MG tablet Take 1 tablet (100 mg total) by mouth daily.    amLODipine (NORVASC) 5 MG tablet Take 1 tablet (5 mg total) by mouth daily.    aspirin 81 MG EC tablet Take 81 mg by mouth daily.   07/18/2014: Pt stated she "threw up" medication this past week.    calcitRIOL (ROCALTROL) 0.25 MCG capsule TAKE ONE CAPSULE BY MOUTH EVERY OTHER DAY    cetirizine (ZYRTEC) 10 MG tablet Take 10 mg by mouth daily.   07/18/2014: Pt stated she "threw up" medication this past week.    colchicine 0.6 MG tablet Take 1 tablet by mouth twice daily for the first 7-10 days to prevent gout attack due to allopurinol dosage change. (Patient not taking: Reported on 09/08/2021)    furosemide (LASIX) 40 MG tablet TAKE 1 TABLET(40 MG) BY MOUTH DAILY    levothyroxine (SYNTHROID) 50 MCG tablet TAKE 1 TABLET BY MOUTH ONCE DAILY THEN 1/2 TABLET THE NEXT DAY ROTATING    potassium chloride SA (KLOR-CON M) 20 MEQ tablet TAKE 1 TABLET(20 MEQ) BY MOUTH DAILY    simvastatin (ZOCOR) 20 MG tablet Take 1 tablet (20 mg total) by mouth at bedtime.    No facility-administered encounter medications on file as of 03/12/2022.  Fill History: ALLOPURINOL '100MG'$  TABLETS 12/10/2021 90   CALCITRIOL 0.25MCG CAPSULES 01/30/2022 90   01/28/2022 90   COLCHICINE 0.'6MG'$  TABLETS 05/06/2021 69    FUROSEMIDE '40MG'$  TABLETS 01/30/2022 90   LEVOTHYROXINE 0.'05MG'$  (50MCG)  TAB 01/26/2022 90   POTASSIUM CL 20MEQ ER TABLETS 01/30/2022 90   SIMVASTATIN '20MG'$  TABLETS 01/28/2022 90   Reviewed chart prior to disease state call. Spoke with patient regarding BP  Recent Office Vitals: BP Readings from Last 3 Encounters:  04/23/21 130/80  03/27/20 130/78  03/24/19 120/72   Pulse Readings from Last 3 Encounters:  04/23/21 94  03/27/20 100  03/24/19 93    Wt Readings from Last 3 Encounters:  04/23/21 203 lb (92.1 kg)  03/27/20 200 lb 8 oz (90.9 kg)  03/24/19 191 lb 6 oz (86.8 kg)     Kidney Function Lab Results  Component Value Date/Time   CREATININE 1.12 04/23/2021 11:46 AM   CREATININE 1.42 (H) 04/07/2017 03:20 PM   GFR 44.82 (L) 04/23/2021 11:46 AM   GFRNONAA 38 (L) 07/26/2014 04:45 AM   GFRAA 44 (L) 07/26/2014 04:45 AM       Latest Ref Rng & Units 04/23/2021   11:46 AM 03/28/2019   10:20 AM 04/07/2017    3:20 PM  BMP  Glucose 70 - 99 mg/dL 126   129   BUN 6 - 23 mg/dL 20   26   Creatinine 0.40 - 1.20 mg/dL 1.12   1.42   Sodium 135 - 145 mEq/L 138   138   Potassium 3.5 - 5.1 mEq/L 3.6  3.8  4.1   Chloride 96 - 112 mEq/L 100  99   CO2 19 - 32 mEq/L 27   25   Calcium 8.4 - 10.5 mg/dL 9.9   10.3     Current antihypertensive regimen:  Amlodipine '5mg'$  daily  How often are you checking your Blood Pressure? Patient hasn't been checking her blood pressures all summer. She plans to start checking blood pressures again weekly.   Current home BP readings: Patient hasn't been checking blood pressures.   What recent interventions/DTPs have been made by any provider to improve Blood Pressure control since last CPP Visit: No recent interventions  Any recent hospitalizations or ED visits since last visit with CPP? No recent hospital visits.   What diet changes have been made to improve Blood Pressure Control?  Patient follows a low sodium diet Breakfast  - patient will have a peanut butter and banana sandwich Lunch - patient doesn't eat  lunch Dinner - patient will have a meal mostly vegetables and a little meat Snack - patient will have a snack to take with her evening medication, she doesn't snack between meals  What exercise is being done to improve your Blood Pressure Control?  Patient works out with a Clinical research associate once weekly and stays active in her home every day.  Adherence Review: Is the patient currently on ACE/ARB medication? No Does the patient have >5 day gap between last estimated fill dates? No  Care Gaps: AWV - 03/12/22 message sent to Ramond Craver Last BP - 130/80 on 04/23/2021 Last A1C - 6.0 on 04/23/2021 Shingrix - never done Covid booster - overdue Flu - due  Star Rating Drugs: Simvastatin 20 mg - last filled 01/28/2022 90 DS at [facility] Pharmacist Assistant [phone]

## 2022-03-16 ENCOUNTER — Ambulatory Visit: Payer: Medicare Other

## 2022-03-16 ENCOUNTER — Ambulatory Visit
Admission: RE | Admit: 2022-03-16 | Discharge: 2022-03-16 | Disposition: A | Discharge status: Home / Self Care | Payer: Medicare Other | Source: Ambulatory Visit | Attending: Family Medicine | Admitting: Family Medicine

## 2022-03-16 DIAGNOSIS — R928 Other abnormal and inconclusive findings on diagnostic imaging of breast: Secondary | ICD-10-CM | POA: Diagnosis not present

## 2022-04-01 DIAGNOSIS — H524 Presbyopia: Secondary | ICD-10-CM | POA: Diagnosis not present

## 2022-04-01 DIAGNOSIS — H04123 Dry eye syndrome of bilateral lacrimal glands: Secondary | ICD-10-CM | POA: Diagnosis not present

## 2022-04-01 DIAGNOSIS — Z961 Presence of intraocular lens: Secondary | ICD-10-CM | POA: Diagnosis not present

## 2022-04-01 DIAGNOSIS — H353121 Nonexudative age-related macular degeneration, left eye, early dry stage: Secondary | ICD-10-CM | POA: Diagnosis not present

## 2022-04-01 DIAGNOSIS — H35373 Puckering of macula, bilateral: Secondary | ICD-10-CM | POA: Diagnosis not present

## 2022-04-01 DIAGNOSIS — H5213 Myopia, bilateral: Secondary | ICD-10-CM | POA: Diagnosis not present

## 2022-04-01 LAB — HM DIABETES EYE EXAM

## 2022-04-03 ENCOUNTER — Encounter: Payer: Self-pay | Admitting: Family Medicine

## 2022-04-22 ENCOUNTER — Telehealth: Payer: Self-pay | Admitting: Family Medicine

## 2022-04-22 NOTE — Telephone Encounter (Signed)
Left message for patient to call back and schedule Medicare Annual Wellness Visit (AWV) either virtually or in office. Left  my Herbie Drape number [phone]   Last AWV 04/23/21 please schedule with Nurse Health Adviser   45 min for awv-i and in office appointments 30 min for awv-s  phone/virtual appointments

## 2022-04-27 ENCOUNTER — Other Ambulatory Visit: Payer: Self-pay | Admitting: Family Medicine

## 2022-04-27 ENCOUNTER — Ambulatory Visit (INDEPENDENT_AMBULATORY_CARE_PROVIDER_SITE_OTHER): Payer: Medicare Other

## 2022-04-27 VITALS — Ht 62.5 in | Wt 210.0 lb

## 2022-04-27 DIAGNOSIS — Z0000 Encounter for general adult medical examination without abnormal findings: Secondary | ICD-10-CM

## 2022-04-27 DIAGNOSIS — E039 Hypothyroidism, unspecified: Secondary | ICD-10-CM

## 2022-04-27 NOTE — Patient Instructions (Signed)
[name] , Thank you for taking time to come for your Medicare Wellness Visit. I appreciate your ongoing commitment to your health goals. Please review the following plan we discussed and let me know if I can assist you in the future.   Screening recommendations/referrals: Colonoscopy: not required Mammogram: completed 02/26/2022, due 02/28/2023 Bone Density: completed 04/28/2017 Recommended yearly ophthalmology/optometry visit for glaucoma screening and checkup Recommended yearly dental visit for hygiene and checkup  Vaccinations: Influenza vaccine: due Pneumococcal vaccine: completed 05/21/2014 Tdap vaccine: completed 03/02/2017, due 03/03/2027 Shingles vaccine: discussed   Covid-19: 05/08/2021, 05/10/2020, 08/16/2019, 07/26/2019  Advanced directives: Please bring a copy of your POA (Power of Attorney) and/or Living Will to your next appointment.   Conditions/risks identified: none  Next appointment: Follow up in one year for your annual wellness visit    Preventive Care 65 Years and Older, Female Preventive care refers to lifestyle choices and visits with your health care provider that can promote health and wellness. What does preventive care include? A yearly physical exam. This is also called an annual well check. Dental exams once or twice a year. Routine eye exams. Ask your health care provider how often you should have your eyes checked. Personal lifestyle choices, including: Daily care of your teeth and gums. Regular physical activity. Eating a healthy diet. Avoiding tobacco and drug use. Limiting alcohol use. Practicing safe sex. Taking low-dose aspirin every day. Taking vitamin and mineral supplements as recommended by your health care provider. What happens during an annual well check? The services and screenings done by your health care provider during your annual well check will depend on your age, overall health, lifestyle risk factors, and family history of  disease. Counseling  Your health care provider may ask you questions about your: Alcohol use. Tobacco use. Drug use. Emotional well-being. Home and relationship well-being. Sexual activity. Eating habits. History of falls. Memory and ability to understand (cognition). Work and work Statistician. Reproductive health. Screening  You may have the following tests or measurements: Height, weight, and BMI. Blood pressure. Lipid and cholesterol levels. These may be checked every 5 years, or more frequently if you are over 19 years old. Skin check. Lung cancer screening. You may have this screening every year starting at age 38 if you have a 30-pack-year history of smoking and currently smoke or have quit within the past 15 years. Fecal occult blood test (FOBT) of the stool. You may have this test every year starting at age 51. Flexible sigmoidoscopy or colonoscopy. You may have a sigmoidoscopy every 5 years or a colonoscopy every 10 years starting at age 79. Hepatitis C blood test. Hepatitis B blood test. Sexually transmitted disease (STD) testing. Diabetes screening. This is done by checking your blood sugar (glucose) after you have not eaten for a while (fasting). You may have this done every 1-3 years. Bone density scan. This is done to screen for osteoporosis. You may have this done starting at age 26. Mammogram. This may be done every 1-2 years. Talk to your health care provider about how often you should have regular mammograms. Talk with your health care provider about your test results, treatment options, and if necessary, the need for more tests. Vaccines  Your health care provider may recommend certain vaccines, such as: Influenza vaccine. This is recommended every year. Tetanus, diphtheria, and acellular pertussis (Tdap, Td) vaccine. You may need a Td booster every 10 years. Zoster vaccine. You may need this after age 70. Pneumococcal 13-valent conjugate (PCV13) vaccine.  One  dose is recommended after age 6. Pneumococcal polysaccharide (PPSV23) vaccine. One dose is recommended after age 77. Talk to your health care provider about which screenings and vaccines you need and how often you need them. This information is not intended to replace advice given to you by your health care provider. Make sure you discuss any questions you have with your health care provider. Document Released: 07/19/2015 Document Revised: 03/11/2016 Document Reviewed: 04/23/2015 Elsevier Interactive Patient Education  2017 [place] Prevention in the Home Falls can cause injuries. They can happen to people of all ages. There are many things you can do to make your home safe and to help prevent falls. What can I do on the outside of my home? Regularly fix the edges of walkways and driveways and fix any cracks. Remove anything that might make you trip as you walk through a door, such as a raised step or threshold. Trim any bushes or trees on the path to your home. Use bright outdoor lighting. Clear any walking paths of anything that might make someone trip, such as rocks or tools. Regularly check to see if handrails are loose or broken. Make sure that both sides of any steps have handrails. Any raised decks and porches should have guardrails on the edges. Have any leaves, snow, or ice cleared regularly. Use sand or salt on walking paths during winter. Clean up any spills in your garage right away. This includes oil or grease spills. What can I do in the bathroom? Use night lights. Install grab bars by the toilet and in the tub and shower. Do not use towel bars as grab bars. Use non-skid mats or decals in the tub or shower. If you need to sit down in the shower, use a plastic, non-slip stool. Keep the floor dry. Clean up any water that spills on the floor as soon as it happens. Remove soap buildup in the tub or shower regularly. Attach bath mats securely with double-sided  non-slip rug tape. Do not have throw rugs and other things on the floor that can make you trip. What can I do in the bedroom? Use night lights. Make sure that you have a light by your bed that is easy to reach. Do not use any sheets or blankets that are too big for your bed. They should not hang down onto the floor. Have a firm chair that has side arms. You can use this for support while you get dressed. Do not have throw rugs and other things on the floor that can make you trip. What can I do in the kitchen? Clean up any spills right away. Avoid walking on wet floors. Keep items that you use a lot in easy-to-reach places. If you need to reach something above you, use a strong step stool that has a grab bar. Keep electrical cords out of the way. Do not use floor polish or wax that makes floors slippery. If you must use wax, use non-skid floor wax. Do not have throw rugs and other things on the floor that can make you trip. What can I do with my stairs? Do not leave any items on the stairs. Make sure that there are handrails on both sides of the stairs and use them. Fix handrails that are broken or loose. Make sure that handrails are as long as the stairways. Check any carpeting to make sure that it is firmly attached to the stairs. Fix any carpet that is loose or  worn. Avoid having throw rugs at the top or bottom of the stairs. If you do have throw rugs, attach them to the floor with carpet tape. Make sure that you have a light switch at the top of the stairs and the bottom of the stairs. If you do not have them, ask someone to add them for you. What else can I do to help prevent falls? Wear shoes that: Do not have high heels. Have rubber bottoms. Are comfortable and fit you well. Are closed at the toe. Do not wear sandals. If you use a stepladder: Make sure that it is fully opened. Do not climb a closed stepladder. Make sure that both sides of the stepladder are locked into place. Ask  someone to hold it for you, if possible. Clearly mark and make sure that you can see: Any grab bars or handrails. First and last steps. Where the edge of each step is. Use tools that help you move around (mobility aids) if they are needed. These include: Canes. Walkers. Scooters. Crutches. Turn on the lights when you go into a dark area. Replace any light bulbs as soon as they burn out. Set up your furniture so you have a clear path. Avoid moving your furniture around. If any of your floors are uneven, fix them. If there are any pets around you, be aware of where they are. Review your medicines with your doctor. Some medicines can make you feel dizzy. This can increase your chance of falling. Ask your doctor what other things that you can do to help prevent falls. This information is not intended to replace advice given to you by your health care provider. Make sure you discuss any questions you have with your health care provider. Document Released: 04/18/2009 Document Revised: 11/28/2015 Document Reviewed: 07/27/2014 Elsevier Interactive Patient Education  2017 [employer].

## 2022-04-27 NOTE — Progress Notes (Signed)
I connected with Payslie Mccaig today by telephone and verified that I am speaking with the correct person using two identifiers. Location patient: home Location provider: work Persons participating in the virtual visit: Azaela, Caracci LPN.   I discussed the limitations, risks, security and privacy concerns of performing an evaluation and management service by telephone and the availability of in person appointments. I also discussed with the patient that there may be a patient responsible charge related to this service. The patient expressed understanding and verbally consented to this telephonic visit.    Interactive audio and video telecommunications were attempted between this provider and patient, however failed, due to patient having technical difficulties OR patient did not have access to video capability.  We continued and completed visit with audio only.     Vital signs may be patient reported or missing.  Subjective:   [name] is a 88 y.o. female who presents for Medicare Annual (Subsequent) preventive examination.  Review of Systems     Cardiac Risk Factors include: advanced age (>72mn, >>58women);dyslipidemia;hypertension;obesity (BMI >30kg/m2)     Objective:    Today's Vitals   04/27/22 1257  Weight: 210 lb (95.3 kg)  Height: 5' 2.5" (1.588 m)   Body mass index is 37.8 kg/m.     04/27/2022    1:03 PM 03/02/2017    3:22 PM 02/28/2016    2:36 PM 07/18/2014    5:02 PM 07/18/2014    1:27 PM  Advanced Directives  Does Patient Have a Medical Advance Directive? Yes Yes Yes  No  Type of AParamedicof AMoroniLiving will      Copy of HRandallin Chart? No - copy requested  No - copy requested    Would patient like information on creating a medical advance directive?    No - patient declined information     Current Medications (verified) Outpatient Encounter Medications as of 04/27/2022  Medication  Sig   acetaminophen (TYLENOL) 650 MG CR tablet Take 650 mg by mouth every 8 (eight) hours as needed for pain (pain).    amLODipine (NORVASC) 5 MG tablet Take 1 tablet (5 mg total) by mouth daily.   aspirin 81 MG EC tablet Take 81 mg by mouth daily.     calcitRIOL (ROCALTROL) 0.25 MCG capsule TAKE ONE CAPSULE BY MOUTH EVERY OTHER DAY   cetirizine (ZYRTEC) 10 MG tablet Take 10 mg by mouth daily.     furosemide (LASIX) 40 MG tablet TAKE 1 TABLET(40 MG) BY MOUTH DAILY   levothyroxine (SYNTHROID) 50 MCG tablet TAKE 1 TABLET BY MOUTH ONCE DAILY, ALTERNATING WITH 1/2 TABLET BY MOUTH DAILY   potassium chloride SA (KLOR-CON M) 20 MEQ tablet TAKE 1 TABLET(20 MEQ) BY MOUTH DAILY   simvastatin (ZOCOR) 20 MG tablet Take 1 tablet (20 mg total) by mouth at bedtime.   allopurinol (ZYLOPRIM) 100 MG tablet Take 1 tablet (100 mg total) by mouth daily. (Patient not taking: Reported on 04/27/2022)   colchicine 0.6 MG tablet Take 1 tablet by mouth twice daily for the first 7-10 days to prevent gout attack due to allopurinol dosage change. (Patient not taking: Reported on 09/08/2021)   No facility-administered encounter medications on file as of 04/27/2022.    Allergies (verified) Drug ingredient [sulfamethoxazole-trimethoprim] and Shellfish allergy   History: Past Medical History:  Diagnosis Date   Allergy    Arthritis    Broken foot    left   [place]   CKD (chronic kidney disease), stage III (HCC)    Hyperlipidemia    Hypertension    Obesity    Thyroid disease    Past Surgical History:  Procedure Laterality Date   BREAST BIOPSY Left    CATARACT EXTRACTION     CHOLECYSTECTOMY     TOTAL ABDOMINAL HYSTERECTOMY W/ BILATERAL SALPINGOOPHORECTOMY     TOTAL KNEE ARTHROPLASTY     Family History  Problem Relation Age of Onset   Breast cancer Daughter 42   Breast cancer Cousin 37   Diabetes Other        fhx   Hypertension Other        fhx   Obesity Other        fhx   Social History    Socioeconomic History   Marital status: Married    Spouse name: Not on file   Number of children: Not on file   Years of education: Not on file   Highest education level: Not on file  Occupational History   Not on file  Tobacco Use   Smoking status: Never   Smokeless tobacco: Never  Vaping Use   Vaping Use: Never used  Substance and Sexual Activity   Alcohol use: No   Drug use: No   Sexual activity: Not Currently  Other Topics Concern   Not on file  Social History Narrative   Not on file   Social Determinants of Health   Financial Resource Strain: Low Risk  (04/27/2022)   Overall Financial Resource Strain (CARDIA)    Difficulty of Paying Living Expenses: Not hard at all  Food Insecurity: No Food Insecurity (04/27/2022)   Hunger Vital Sign    Worried About Running Out of Food in the Last Year: Never true    Ran Out of Food in the Last Year: Never true  Transportation Needs: No Transportation Needs (04/27/2022)   PRAPARE - Hydrologist (Medical): No    Lack of Transportation (Non-Medical): No  Physical Activity: Insufficiently Active (04/27/2022)   Exercise Vital Sign    Days of Exercise per Week: 1 day    Minutes of Exercise per Session: 30 min  Stress: No Stress Concern Present (04/27/2022)   [place]    Feeling of Stress : Not at all  Social Connections: Not on file    Tobacco Counseling Counseling given: Not Answered   Clinical Intake:  Pre-visit preparation completed: Yes  Pain : No/denies pain     Nutritional Status: BMI > 30  Obese Nutritional Risks: None Diabetes: No  How often do you need to have someone help you when you read instructions, pamphlets, or other written materials from your doctor or pharmacy?: 1 - Never What is the last grade level you completed in school?: bachelor's degree  Diabetic? no  Interpreter Needed?: No  Information  entered by :: NAllen LPN   Activities of Daily Living    04/27/2022    1:04 PM  In your present state of health, do you have any difficulty performing the following activities:  Hearing? 0  Vision? 0  Difficulty concentrating or making decisions? 0  Walking or climbing stairs? 0  Dressing or bathing? 0  Doing errands, shopping? 0  Preparing Food and eating ? N  Using the Toilet? N  In the past six months, have you accidently leaked urine? N  Do you have problems with loss of bowel control? N  Managing your Medications? N  Managing your Finances? N  Housekeeping or managing your Housekeeping? N    Patient Care Team: Martinique, Betty G, MD as PCP - General (Family Medicine) Viona Gilmore, [facility] as Pharmacist (Pharmacist)  Indicate any recent Medical Services you may have received from other than [facility] providers in the past year (date may be approximate).     Assessment:   This is a routine wellness examination for [name].  Hearing/Vision screen Vision Screening - Comments:: Regular eye exams, [facility], Dr. Satira Sark  Dietary issues and exercise activities discussed: Current Exercise Habits: Home exercise routine, Type of exercise: strength training/weights;calisthenics, Time (Minutes): 30, Frequency (Times/Week): 1, Weekly Exercise (Minutes/Week): 30   Goals Addressed             This Visit's Progress    Patient Stated       04/27/2022, staying alive       Depression Screen    04/27/2022    1:03 PM 04/23/2021   11:02 AM 03/27/2020   11:13 AM 03/25/2019    2:25 PM 03/18/2018   12:11 PM 03/02/2017    3:24 PM 06/24/2015    9:50 AM  PHQ 2/9 Scores  PHQ - 2 Score 0 0 0 0 0 0 0    Fall Risk    04/27/2022    1:03 PM 04/23/2021   11:02 AM 03/30/2020    6:29 PM 03/25/2019    2:24 PM 03/18/2018   12:11 PM  [place] in the past year? 0 0 1 0 No  Number falls in past yr: 0 0 0 0   Injury with Fall? 0 0 0 0   Risk for fall due to : Medication side  effect Impaired balance/gait     Follow up Falls prevention discussed;Falls evaluation completed;Education provided Education provided Education provided Education provided     FALL RISK PREVENTION PERTAINING TO THE HOME:  Any stairs in or around the home? Yes  If so, are there any without handrails? Yes  Home free of loose throw rugs in walkways, pet beds, electrical cords, etc? Yes  Adequate lighting in your home to reduce risk of falls? Yes   ASSISTIVE DEVICES UTILIZED TO PREVENT FALLS:  Life alert? No  Use of a cane, walker or w/c? No  Grab bars in the bathroom? No  Shower chair or bench in shower? Yes  Elevated toilet seat or a handicapped toilet? No   TIMED UP AND GO:  Was the test performed? No .      Cognitive Function:        04/27/2022    1:05 PM  6CIT Screen  What Year? 0 points  What month? 0 points  What time? 0 points  Count back from 20 0 points  Months in reverse 0 points  Repeat phrase 2 points  Total Score 2 points    Immunizations Immunization History  Administered Date(s) Administered   Fluad Quad(high Dose 65+) 03/24/2019, 03/27/2020, 04/23/2021   Influenza Split 03/15/2011, 03/17/2012, 04/17/2013   Influenza Whole 07/06/2004, 04/05/2009, 02/27/2010   Influenza, High Dose Seasonal PF 02/28/2016, 03/02/2017, 03/18/2018   Influenza-Unspecified 03/06/2014, 03/07/2015   PFIZER(Purple Top)SARS-COV-2 Vaccination 07/26/2019, 08/16/2019, 05/10/2020   Pfizer Covid-19 Vaccine Bivalent Booster 63yr & up 05/08/2021   Pneumococcal Conjugate-13 05/21/2014   Pneumococcal Polysaccharide-23 07/06/2001, 01/19/2008   Td 07/06/2005   Tdap 03/02/2017   Zoster, Live 01/19/2008    TDAP status: Up to date  Flu Vaccine status: Due, Education  has been provided regarding the importance of this vaccine. Advised may receive this vaccine at local pharmacy or Health Dept. Aware to provide a copy of the vaccination record if obtained from local pharmacy or Health  Dept. Verbalized acceptance and understanding.  Pneumococcal vaccine status: Up to date  Covid-19 vaccine status: Completed vaccines  Qualifies for Shingles Vaccine? Yes   Zostavax completed Yes   Shingrix Completed?: No.    Education has been provided regarding the importance of this vaccine. Patient has been advised to call insurance company to determine out of pocket expense if they have not yet received this vaccine. Advised may also receive vaccine at local pharmacy or Health Dept. Verbalized acceptance and understanding.  Screening Tests Health Maintenance  Topic Date Due   Zoster Vaccines- Shingrix (1 of 2) Never done   COVID-19 Vaccine (5 - Pfizer risk series) 07/03/2021   INFLUENZA VACCINE  02/03/2022   TETANUS/TDAP  03/03/2027   Pneumonia Vaccine 55+ Years old  Completed   DEXA SCAN  Completed   HPV VACCINES  Aged Out    Health Maintenance  Health Maintenance Due  Topic Date Due   Zoster Vaccines- Shingrix (1 of 2) Never done   COVID-19 Vaccine (5 - Pfizer risk series) 07/03/2021   INFLUENZA VACCINE  02/03/2022    Colorectal cancer screening: No longer required.   Mammogram status: Completed 02/26/2022. Repeat every year  Bone Density status: Completed 04/28/2017.  Lung Cancer Screening: (Low Dose CT Chest recommended if Age 10-80 years, 30 pack-year currently smoking OR have quit w/in 15years.) does not qualify.   Lung Cancer Screening Referral: no  Additional Screening:  Hepatitis C Screening: does not qualify;   Vision Screening: Recommended annual ophthalmology exams for early detection of glaucoma and other disorders of the eye. Is the patient up to date with their annual eye exam?  Yes  Who is the provider or what is the name of the office in which the patient attends annual eye exams? Dr. Satira Sark If pt is not established with a provider, would they like to be referred to a provider to establish care? No .   Dental Screening: Recommended annual dental  exams for proper oral hygiene  Community Resource Referral / Chronic Care Management: CRR required this visit?  No   CCM required this visit?  No      Plan:     I have personally reviewed and noted the following in the patient's chart:   Medical and social history Use of alcohol, tobacco or illicit drugs  Current medications and supplements including opioid prescriptions. Patient is not currently taking opioid prescriptions. Functional ability and status Nutritional status Physical activity Advanced directives List of other physicians Hospitalizations, surgeries, and ER visits in previous 12 months Vitals Screenings to include cognitive, depression, and falls Referrals and appointments  In addition, I have reviewed and discussed with patient certain preventive protocols, quality metrics, and best practice recommendations. A written personalized care plan for preventive services as well as general preventive health recommendations were provided to patient.     Kellie Simmering, LPN   [id number]   Nurse Notes: none  Due to this being a virtual visit, the after visit summary with patients personalized plan was offered to patient via mail or my-chart.  to pick up at office at next visit

## 2022-04-28 ENCOUNTER — Encounter: Payer: Medicare Other | Admitting: Family Medicine

## 2022-04-28 NOTE — Progress Notes (Signed)
HPI: [name] is a 88 y.o. female with medical hx significant for HTN,CKD III,HLD,gout,and atrial fib here today for [name] annual follow up. AWV on 04/27/22.  No new problems since [name] last visit. HTN and CKD III: [name] follows with nephrologist regularly, last visit 8 months f/u was recommended.  Negative for severe/frequent headache, visual changes, chest pain, dyspnea, palpitation, focal weakness, or unusual edema. [name] is on Amlodipine 5 mg daily. Takes Furosemide 40 mg daily. HypoK+ on KLOR 20 meq daily. Atrial fib not longer on anticoagulation. [name] takes Aspirin 81 mg daily.  Lab Results  Component Value Date   CREATININE 1.12 04/23/2021   BUN 20 04/23/2021   NA 138 04/23/2021   K 3.6 04/23/2021   CL 100 04/23/2021   CO2 27 04/23/2021   HLD on Simvastatin 20 mg daily. [name] has tolerated well. No side effects reported.  Lab Results  Component Value Date   CHOL 172 04/23/2021   HDL 70.70 04/23/2021   LDLCALC 83 04/23/2021   LDLDIRECT 91.4 02/03/2010   TRIG 91.0 04/23/2021   CHOLHDL 2 04/23/2021   Prediabetes: Negative for polydipsia,polyuria, or polyphagia. Lab Results  Component Value Date   HGBA1C 6.0 04/23/2021   Gout: [name] has not taken [name] Allopurinol 100 mg for about a month, does not remember when [name] hid it while [name] grandson was visiting. [name] has not had a gout exacerbation but has some joint pain, feeling like [name] is going to have one. [name] has not taken Colchicine in a while. Generalized OA, [name] uses a cane sometimes.  Hypothyroidism: [name] is on Levothyroxine 50 mcg , alternates between 1/2 and 1 tab every other day. Last TSH 3.9 in 04/2021.  Review of Systems  Constitutional:  Negative for activity change, appetite change and fever.  HENT:  Negative for mouth sores, nosebleeds and sore throat.   Respiratory:  Negative for cough and wheezing.   Gastrointestinal:  Negative for abdominal pain, nausea and vomiting.       Negative for changes in  bowel habits.  Endocrine: Negative for cold intolerance and heat intolerance.  Genitourinary:  Negative for decreased urine volume, difficulty urinating, dysuria and hematuria.  Musculoskeletal:  Positive for arthralgias and gait problem.  Skin:  Negative for rash.  Neurological:  Positive for tremors (stable.). Negative for syncope, facial asymmetry and weakness.  Psychiatric/Behavioral:  Negative for confusion.   Rest see pertinent positives and negatives per HPI.  Current Outpatient Medications on File Prior to Visit  Medication Sig Dispense Refill   acetaminophen (TYLENOL) 650 MG CR tablet Take 650 mg by mouth every 8 (eight) hours as needed for pain (pain).      amLODipine (NORVASC) 5 MG tablet Take 1 tablet (5 mg total) by mouth daily. 90 tablet 3   aspirin 81 MG EC tablet Take 81 mg by mouth daily.       calcitRIOL (ROCALTROL) 0.25 MCG capsule TAKE ONE CAPSULE BY MOUTH EVERY OTHER DAY 90 capsule 3   cetirizine (ZYRTEC) 10 MG tablet Take 10 mg by mouth daily.       colchicine 0.6 MG tablet Take 1 tablet by mouth twice daily for the first 7-10 days to prevent gout attack due to allopurinol dosage change. 20 tablet 0   furosemide (LASIX) 40 MG tablet TAKE 1 TABLET(40 MG) BY MOUTH DAILY 90 tablet 3   potassium chloride SA (KLOR-CON M) 20 MEQ tablet TAKE 1 TABLET(20 MEQ) BY MOUTH DAILY 90 tablet 3   simvastatin (ZOCOR)  20 MG tablet Take 1 tablet (20 mg total) by mouth at bedtime. 90 tablet 3   No current facility-administered medications on file prior to visit.    Past Medical History:  Diagnosis Date   Allergy    Arthritis    Broken foot    left   Cancer ([place])    ovarian   CKD (chronic kidney disease), stage III (HCC)    Hyperlipidemia    Hypertension    Obesity    Thyroid disease    Allergies  Allergen Reactions   Drug Ingredient [Sulfamethoxazole-Trimethoprim]     Lips edema,dizziness,and shaky sensation.   Shellfish Allergy     Social History   Socioeconomic  History   Marital status: Married    Spouse name: Not on file   Number of children: Not on file   Years of education: Not on file   Highest education level: Not on file  Occupational History   Not on file  Tobacco Use   Smoking status: Never   Smokeless tobacco: Never  Vaping Use   Vaping Use: Never used  Substance and Sexual Activity   Alcohol use: No   Drug use: No   Sexual activity: Not Currently  Other Topics Concern   Not on file  Social History Narrative   Not on file   Social Determinants of Health   Financial Resource Strain: Low Risk  (04/27/2022)   Overall Financial Resource Strain (CARDIA)    Difficulty of Paying Living Expenses: Not hard at all  Food Insecurity: No Food Insecurity (04/27/2022)   Hunger Vital Sign    Worried About Running Out of Food in the Last Year: Never true    Ran Out of Food in the Last Year: Never true  Transportation Needs: No Transportation Needs (04/27/2022)   PRAPARE - Hydrologist (Medical): No    Lack of Transportation (Non-Medical): No  Physical Activity: Insufficiently Active (04/27/2022)   Exercise Vital Sign    Days of Exercise per Week: 1 day    Minutes of Exercise per Session: 30 min  Stress: No Stress Concern Present (04/27/2022)   [place]    Feeling of Stress : Not at all  Social Connections: Not on file   Vitals:   04/29/22 1046  BP: 128/70  Pulse: 86  Resp: 16  Temp: 98 F (36.7 C)  SpO2: 97%  Body mass index is 37.3 kg/m.  Physical Exam Vitals and nursing note reviewed.  Constitutional:      General: [name] is not in acute distress.    Appearance: [name] is well-developed.  HENT:     Head: Normocephalic and atraumatic.     Mouth/Throat:     Mouth: Mucous membranes are moist.     Pharynx: Oropharynx is clear.  Eyes:     Conjunctiva/sclera: Conjunctivae normal.  Cardiovascular:     Rate and Rhythm: Normal rate  and regular rhythm.     Heart sounds: No murmur heard.    Comments: DP pulses palpable. Peri-ankle edema L>R. Pulmonary:     Effort: Pulmonary effort is normal. No respiratory distress.     Breath sounds: Normal breath sounds.  Abdominal:     Palpations: Abdomen is soft. There is no mass.     Tenderness: There is no abdominal tenderness.  Lymphadenopathy:     Cervical: No cervical adenopathy.  Skin:    General: Skin is warm.     Findings:  No erythema or rash.  Neurological:     General: No focal deficit present.     Mental Status: [name] is alert and oriented to person, place, and time.     Cranial Nerves: No cranial nerve deficit.     Comments: Stable gait, not assisted.  Psychiatric:        Mood and Affect: Mood and affect normal.   ASSESSMENT AND PLAN:  Ms.Shandi was seen today for follow-up.  Diagnoses and all orders for this visit: Orders Placed This Encounter  Procedures   Flu Vaccine QUAD High Dose(Fluad)   Hemoglobin A1c   TSH   Hepatic function panel   Lipid panel   Uric acid   Lab Results  Component Value Date   CHOL 175 04/29/2022   HDL 67.00 04/29/2022   LDLCALC 90 04/29/2022   LDLDIRECT 91.4 02/03/2010   TRIG 93.0 04/29/2022   CHOLHDL 3 04/29/2022   Lab Results  Component Value Date   HGBA1C 6.1 04/29/2022   Lab Results  Component Value Date   TSH 3.43 04/29/2022   Lab Results  Component Value Date   ALT 14 04/29/2022   AST 18 04/29/2022   ALKPHOS 78 04/29/2022   BILITOT 0.7 04/29/2022   Lab Results  Component Value Date   LABURIC 9.3 (H) 04/29/2022   Hyperlipidemia, unspecified hyperlipidemia type Continue Simvastatin 20 mg daily and low fat diet. Further recommendations according to FLP result.  Hypothyroidism, unspecified type Problem has been well controlled. No changes in Levothyroxine dose. TSH will be obtained today.  -     levothyroxine (SYNTHROID) 50 MCG tablet; TAKE 1 TABLET BY MOUTH ONCE DAILY, ALTERNATING WITH 1/2 TABLET  BY MOUTH DAILY  Gouty arthropathy Has not had an acute attack in over a year. Resume Allopurinol 100 mg daily. Low purine diet to continue.  -     allopurinol (ZYLOPRIM) 100 MG tablet; Take 1 tablet (100 mg total) by mouth daily.  Atrial fibrillation, unspecified type (HCC) Rhythm and rate control today. [name] is not longer on anticoagulation and not longer following with cardiologist. Instructed about warning signs.  Essential hypertension BP adequately controlled. Continue current management: Amlodipine 5 mg daily. DASH/low salt diet to continue. Monitor BP at home. Eye exam is current.  Prediabetes Continue a healthy life style for diabetes prevention. HgA1C ordered today.  Need for influenza vaccination -     Flu Vaccine QUAD High Dose(Fluad)  Return in about 1 year (around 04/30/2023) for follow up.  Eilam Shrewsbury G. Martinique, MD  [facility]. [place] office.

## 2022-04-29 ENCOUNTER — Encounter: Payer: Self-pay | Admitting: Family Medicine

## 2022-04-29 ENCOUNTER — Ambulatory Visit (INDEPENDENT_AMBULATORY_CARE_PROVIDER_SITE_OTHER): Payer: Medicare Other | Admitting: Family Medicine

## 2022-04-29 VITALS — BP 128/70 | HR 86 | Temp 98.0°F | Resp 16 | Ht 62.5 in | Wt 207.2 lb

## 2022-04-29 DIAGNOSIS — I10 Essential (primary) hypertension: Secondary | ICD-10-CM | POA: Diagnosis not present

## 2022-04-29 DIAGNOSIS — Z23 Encounter for immunization: Secondary | ICD-10-CM

## 2022-04-29 DIAGNOSIS — M109 Gout, unspecified: Secondary | ICD-10-CM | POA: Diagnosis not present

## 2022-04-29 DIAGNOSIS — E785 Hyperlipidemia, unspecified: Secondary | ICD-10-CM

## 2022-04-29 DIAGNOSIS — I4891 Unspecified atrial fibrillation: Secondary | ICD-10-CM

## 2022-04-29 DIAGNOSIS — R7303 Prediabetes: Secondary | ICD-10-CM | POA: Diagnosis not present

## 2022-04-29 DIAGNOSIS — E039 Hypothyroidism, unspecified: Secondary | ICD-10-CM

## 2022-04-29 LAB — LIPID PANEL
Cholesterol: 175 mg/dL (ref 0–200)
HDL: 67 mg/dL (ref >39.00)
LDL Cholesterol: 90 mg/dL (ref 0–99)
NonHDL: 108.19
Total CHOL/HDL Ratio: 3
Triglycerides: 93 mg/dL (ref 0.0–149.0)
VLDL: 18.6 mg/dL (ref 0.0–40.0)

## 2022-04-29 LAB — HEPATIC FUNCTION PANEL
ALT: 14 U/L (ref 0–35)
AST: 18 U/L (ref 0–37)
Albumin: 4.4 g/dL (ref 3.5–5.2)
Alkaline Phosphatase: 78 U/L (ref 39–117)
Bilirubin, Direct: 0.2 mg/dL (ref 0.0–0.3)
Total Bilirubin: 0.7 mg/dL (ref 0.2–1.2)
Total Protein: 7.4 g/dL (ref 6.0–8.3)

## 2022-04-29 LAB — HEMOGLOBIN A1C: Hgb A1c MFr Bld: 6.1 % (ref 4.6–6.5)

## 2022-04-29 LAB — URIC ACID: Uric Acid, Serum: 9.3 mg/dL — ABNORMAL HIGH (ref 2.4–7.0)

## 2022-04-29 LAB — TSH: TSH: 3.43 u[IU]/mL (ref 0.35–5.50)

## 2022-04-29 MED ORDER — ALLOPURINOL 100 MG PO TABS: 100.0000 mg | ORAL_TABLET | Freq: Every day | ORAL | 3 refills | Status: DC

## 2022-04-29 MED ORDER — LEVOTHYROXINE SODIUM 50 MCG PO TABS: ORAL_TABLET | ORAL | 3 refills | Status: DC

## 2022-04-29 NOTE — Patient Instructions (Addendum)
A few things to remember from today's visit:   Hyperlipidemia, unspecified hyperlipidemia type - Plan: Hepatic function panel, Lipid panel  Hypothyroidism, unspecified type - Plan: levothyroxine (SYNTHROID) 50 MCG tablet, TSH  Gouty arthropathy - Plan: allopurinol (ZYLOPRIM) 100 MG tablet, Uric acid  Essential hypertension  Prediabetes - Plan: Hemoglobin A1c  A few tips:  -As we age balance is not as good as it was, so there is a higher risks for falls. Please remove small rugs and furniture that is "in your way" and could increase the risk of falls. Stretching exercises may help with fall prevention: Yoga and Tai Chi are some examples. Low impact exercise is better, so you are not very achy the next day.  -Sun screen and avoidance of direct sun light recommended. Caution with dehydration, if working outdoors be sure to drink enough fluids.  - Some medications are not safe as we age, increases the risk of side effects and can potentially interact with other medication you are also taken;  including some of over the counter medications. Be sure to let me know when you start a new medication even if it is a dietary/vitamin supplement.   -Healthy diet low in red meet/animal fat and sugar + regular physical activity is recommended.      If you need refills for medications you take chronically, please call your pharmacy. Do not use My Chart to request refills or for acute issues that need immediate attention. If you send a my chart message, it may take a few days to be addressed, specially if I am not in the office.  Please be sure medication list is accurate. If a new problem present, please set up appointment sooner than planned today.

## 2022-05-20 DIAGNOSIS — N1831 Chronic kidney disease, stage 3a: Secondary | ICD-10-CM | POA: Diagnosis not present

## 2022-05-20 DIAGNOSIS — N2581 Secondary hyperparathyroidism of renal origin: Secondary | ICD-10-CM | POA: Diagnosis not present

## 2022-05-20 DIAGNOSIS — N390 Urinary tract infection, site not specified: Secondary | ICD-10-CM | POA: Diagnosis not present

## 2022-05-20 DIAGNOSIS — D631 Anemia in chronic kidney disease: Secondary | ICD-10-CM | POA: Diagnosis not present

## 2022-05-20 DIAGNOSIS — M109 Gout, unspecified: Secondary | ICD-10-CM | POA: Diagnosis not present

## 2022-05-20 DIAGNOSIS — N189 Chronic kidney disease, unspecified: Secondary | ICD-10-CM | POA: Diagnosis not present

## 2022-05-20 DIAGNOSIS — I129 Hypertensive chronic kidney disease with stage 1 through stage 4 chronic kidney disease, or unspecified chronic kidney disease: Secondary | ICD-10-CM | POA: Diagnosis not present

## 2022-05-21 LAB — IRON,TIBC AND FERRITIN PANEL
%SAT: 19
Ferritin: 129
Iron: 58
TIBC: 308
UIBC: 250

## 2022-05-21 LAB — BASIC METABOLIC PANEL
BUN: 17 (ref 4–21)
CO2: 25 — AB (ref 13–22)
Chloride: 98 — AB (ref 99–108)
Creatinine: 1.2 — AB (ref 0.5–1.1)
Glucose: 127
Potassium: 3.9 mEq/L (ref 3.5–5.1)
Sodium: 139 (ref 137–147)

## 2022-05-21 LAB — CBC AND DIFFERENTIAL
HCT: 44 (ref 36–46)
Hemoglobin: 15 (ref 12.0–16.0)
Neutrophils Absolute: 5
Platelets: 253 10*3/uL (ref 150–400)
WBC: 7.7

## 2022-05-21 LAB — CBC: RBC: 4.94 (ref 3.87–5.11)

## 2022-05-21 LAB — COMPREHENSIVE METABOLIC PANEL
Albumin: 4.8 (ref 3.5–5.0)
Calcium: 9.6 (ref 8.7–10.7)

## 2022-05-27 ENCOUNTER — Encounter: Payer: Self-pay | Admitting: Family Medicine

## 2022-06-15 ENCOUNTER — Other Ambulatory Visit: Payer: Self-pay | Admitting: Family Medicine

## 2022-07-27 ENCOUNTER — Other Ambulatory Visit: Payer: Self-pay | Admitting: Family Medicine

## 2022-07-27 DIAGNOSIS — N183 Chronic kidney disease, stage 3 unspecified: Secondary | ICD-10-CM

## 2022-07-27 DIAGNOSIS — E039 Hypothyroidism, unspecified: Secondary | ICD-10-CM

## 2022-07-27 DIAGNOSIS — E2089 Other specified hypoparathyroidism: Secondary | ICD-10-CM

## 2022-09-04 ENCOUNTER — Telehealth: Payer: Self-pay

## 2022-09-04 NOTE — Progress Notes (Signed)
Care Management & Coordination Services Pharmacy Team  Reason for Encounter: Appointment Reminder  Contacted patient to confirm telephone appointment with Burman Riis, PharmD on 09/07/2022 at 1:00. Spoke with patient on 09/04/2022   Do you have any problems getting your medications? Patient denies  What is your top health concern you would like to discuss at your upcoming visit? Patient denies  Have you seen any other providers since your last visit with PCP? Patient denies  Care Gaps: AWV - completed 04/27/2022, scheduled 04/29/2023 Last BP - 128/70 on 04/29/2022 Shingrix - never done Covid - overdue   Star Rating Drugs: Simvastatin 20 mg - last filled 08/06/2022 90 DS at [facility], verified with [place] Pharmacist Assistant [phone]

## 2022-09-06 NOTE — Progress Notes (Signed)
Care Management & Coordination Services Pharmacy Note  09/06/2022 Name:  [name] MRN:  [id number] DOB:  1934-03-20  Summary: BP at goal <130/80 Denies any gout flares in last 6 months Past due for a DEXA (last 2018), declines scheduling at this time  Recommendations/Changes made from today's visit: -Check BP once weekly and keep a log -Discussed appropriate time to use colcrys and discussed avoidance of NSAIDs for kidney protection -Discuss DEXA at next pharmacist appt in 6 months, as patient is past due (last scan was normal in 2018)  Follow up plan: BP call in 2 months Pharmacist visit in 6 months   Subjective: [name] is an 89 y.o. year old female who is a primary patient of Martinique, Malka So, MD.  The care coordination team was consulted for assistance with disease management and care coordination needs.    Engaged with patient by telephone for follow up visit.  Recent office visits: 04/29/22 Betty Martinique, MD - For HLD, no medication changes 04/27/22 Glenna Durand, LPN - For AWV  Recent consult visits: None  Hospital visits: None in previous 6 months   Objective:  Lab Results  Component Value Date   CREATININE 1.2 (A) 05/21/2022   BUN 17 05/21/2022   GFR 44.82 (L) 04/23/2021   GFRNONAA 38 (L) 07/26/2014   GFRAA 44 (L) 07/26/2014   NA 139 05/21/2022   K 3.9 05/21/2022   CALCIUM 9.6 05/21/2022   CO2 25 (A) 05/21/2022   GLUCOSE 126 (H) 04/23/2021    Lab Results  Component Value Date/Time   HGBA1C 6.1 04/29/2022 11:38 AM   HGBA1C 6.0 04/23/2021 11:46 AM   GFR 44.82 (L) 04/23/2021 11:46 AM   GFR 37.69 (L) 04/07/2017 03:20 PM   MICROALBUR 1.6 03/02/2012 09:49 AM   MICROALBUR 0.7 11/25/2006 09:56 AM    Last diabetic Eye exam:  Lab Results  Component Value Date/Time   HMDIABEYEEXA No Retinopathy 04/01/2022 12:00 AM    Last diabetic Foot exam: No results found for: "HMDIABFOOTEX"   Lab Results  Component Value Date   CHOL 175 04/29/2022    HDL 67.00 04/29/2022   LDLCALC 90 04/29/2022   LDLDIRECT 91.4 02/03/2010   TRIG 93.0 04/29/2022   CHOLHDL 3 04/29/2022       Latest Ref Rng & Units 05/21/2022   12:00 AM 04/29/2022   11:38 AM 04/23/2021   11:46 AM  Hepatic Function  Total Protein 6.0 - 8.3 g/dL  7.4  7.7   Albumin 3.5 - 5.0 4.8     4.4  4.6   AST 0 - 37 U/L  18  18   ALT 0 - 35 U/L  14  12   Alk Phosphatase 39 - 117 U/L  78  87   Total Bilirubin 0.2 - 1.2 mg/dL  0.7  0.7   Bilirubin, Direct 0.0 - 0.3 mg/dL  0.2       This result is from an external source.    Lab Results  Component Value Date/Time   TSH 3.43 04/29/2022 11:38 AM   TSH 3.93 04/23/2021 11:46 AM   FREET4 1.05 04/23/2021 11:46 AM       Latest Ref Rng & Units 05/21/2022   12:00 AM 06/18/2016    8:49 AM 03/18/2016   12:00 AM  CBC  WBC  7.7     9.2  8.8      Hemoglobin 12.0 - 16.0 15.0     12.9  13.6  Hematocrit 36 - 46 44     38.8  39      Platelets 150 - 400 K/uL 253     262.0  235         This result is from an external source.    No results found for: "VD25OH", "VITAMINB12"  Clinical ASCVD: No  The ASCVD Risk score (Arnett DK, et al., 2019) failed to calculate for the following reasons:   The 2019 ASCVD risk score is only valid for ages 75 to 20    DEXA 04/28/2017 - Normal     04/29/2022   10:59 AM 04/27/2022    1:03 PM 04/23/2021   11:02 AM  Depression screen PHQ 2/9  Decreased Interest 0 0 0  Down, Depressed, Hopeless 0 0 0  PHQ - 2 Score 0 0 0     Social History   Tobacco Use  Smoking Status Never  Smokeless Tobacco Never   BP Readings from Last 3 Encounters:  04/29/22 128/70  04/23/21 130/80  03/27/20 130/78   Pulse Readings from Last 3 Encounters:  04/29/22 86  04/23/21 94  03/27/20 100   Wt Readings from Last 3 Encounters:  04/29/22 207 lb 4 oz (94 kg)  04/27/22 210 lb (95.3 kg)  04/23/21 203 lb (92.1 kg)   BMI Readings from Last 3 Encounters:  04/29/22 37.30 kg/m  04/27/22 37.80 kg/m   04/23/21 37.13 kg/m    Allergies  Allergen Reactions   Drug Ingredient [Sulfamethoxazole-Trimethoprim]     Lips edema,dizziness,and shaky sensation.   Shellfish Allergy     Medications Reviewed Today     Reviewed by Martinique, Betty G, MD (Physician) on 04/29/22 at 1127  Med List Status: <None>   Medication Order Taking? Sig Documenting Provider Last Dose Status Informant  acetaminophen (TYLENOL) 650 MG CR tablet [id number] Yes Take 650 mg by mouth every 8 (eight) hours as needed for pain (pain).  [provider] Taking Active Self           Med Note Jimmey Ralph, [facility] I   Wed Jul 18, 2014  2:30 PM) Pt stated she "threw up" medication this past week.  allopurinol (ZYLOPRIM) 100 MG tablet [id number]  Take 1 tablet (100 mg total) by mouth daily. Martinique, Betty G, MD  Active   amLODipine (NORVASC) 5 MG tablet [id number] Yes Take 1 tablet (5 mg total) by mouth daily. Martinique, Betty G, MD Taking Active   aspirin 81 MG EC tablet [id number] Yes Take 81 mg by mouth daily.   [provider] Taking Active Self           Med Note Jimmey Ralph, [facility] I   Wed Jul 18, 2014  2:31 PM) Pt stated she "threw up" medication this past week.   calcitRIOL (ROCALTROL) 0.25 MCG capsule [id number] Yes TAKE ONE CAPSULE BY MOUTH EVERY OTHER DAY Martinique, Betty G, MD Taking Active   cetirizine (ZYRTEC) 10 MG tablet [id number] Yes Take 10 mg by mouth daily.   [provider] Taking Active Self           Med Note Jimmey Ralph, [facility] I   Wed Jul 18, 2014  2:31 PM) Pt stated she "threw up" medication this past week.   colchicine 0.6 MG tablet [id number] Yes Take 1 tablet by mouth twice daily for the first 7-10 days to prevent gout attack due to allopurinol dosage change. Martinique, Betty G, MD Taking Active   furosemide (LASIX) 40 MG tablet [id number] Yes TAKE 1 TABLET(40 MG)  BY MOUTH DAILY Martinique, Betty G, MD Taking Active   levothyroxine (SYNTHROID) 50 MCG tablet [id number]  TAKE 1 TABLET BY MOUTH  ONCE DAILY, ALTERNATING WITH 1/2 TABLET BY MOUTH DAILY Martinique, Betty G, MD  Active   potassium chloride SA (KLOR-CON M) 20 MEQ tablet [id number] Yes TAKE 1 TABLET(20 MEQ) BY MOUTH DAILY Martinique, Betty G, MD Taking Active   simvastatin (ZOCOR) 20 MG tablet [id number] Yes Take 1 tablet (20 mg total) by mouth at bedtime. Martinique, Betty G, MD Taking Active             SDOH:  (Social Determinants of Health) assessments and interventions performed: Yes SDOH Interventions    Flowsheet Row Clinical Support from 04/27/2022 in [place] at [place] Management from 02/01/2020 in [place] at [place] Interventions Intervention Not Indicated --  Transportation Interventions Intervention Not Indicated Intervention Not Indicated  Financial Strain Interventions Intervention Not Indicated Intervention Not Indicated  Physical Activity Interventions Intervention Not Indicated --  Stress Interventions Intervention Not Indicated --       Medication Assistance: None required.  Patient affirms current coverage meets needs.  Medication Access: Within the past 30 days, how often has patient missed a dose of medication? None Is a pillbox or other method used to improve adherence? Yes  Factors that may affect medication adherence? no barriers identified Are meds synced by current pharmacy? No  Are meds delivered by current pharmacy? No  Does patient experience delays in picking up medications due to transportation concerns? No   Upstream Services Reviewed: Is patient disadvantaged to use [facility]?: No  Current Rx insurance plan: [employer]  Name and location of Current pharmacy:  [facility] [address] [place], [address] AT [address] [place] [address] Phone: [phone] Fax: [phone]  [facility] services reviewed with  patient today?: No  Patient requests to transfer care to [facility]?: No  Reason patient declined to change pharmacies: Disadvantaged due to insurance/mail order  Compliance/Adherence/Medication fill history: Care Gaps: AWV - completed 04/27/2022, scheduled 04/29/2023 Last BP - 128/70 on 04/29/2022 Shingrix - never done Covid - overdue  Star-Rating Drugs: Simvastatin '20mg'$  PDC 97%   Assessment/Plan   Hypertension (BP goal <130/80) -Controlled -Current treatment: Amlodipine '5mg'$  qd Appropriate, Effective, Safe, Accessible Lasix '40mg'$  qd Appropriate, Effective, Safe, Accessible -Medications previously tried: losartan, valsartan -Current home readings: BP machine needs new batteries, unable to check -Current dietary habits: mindful of salt -Current exercise habits: goes to trainer once a week and takes care of her housework -Denies hypotensive/hypertensive symptoms -Educated on BP goals and benefits of medications for prevention of heart attack, stroke and kidney damage; Daily salt intake goal < 2300 mg; Exercise goal of 150 minutes per week; Importance of home blood pressure monitoring; Symptoms of hypotension and importance of maintaining adequate hydration; -Counseled to monitor BP at home once weekly, document, and provide log at future appointments -Recommended to continue current medication  CKD (Goal: Prevent progression of kidney disease) -Controlled -Current treatment  Calcitriol 0.18mg 1 qod Appropriate, Effective, Safe, Accessible -Medications previously tried: None  -Recommended to continue current medication -Recommend avoidance of NSAIDs   Gout (Goal: Prevent gout flares) -Controlled -Last Gout Flare: cannot recall, none in the last 6 months for usre -Current treatment  Allopurinol '100mg'$  1 qd Appropriate, Effective, Safe, Accessible Colchicine 0.'6mg'$  prn Appropriate, Effective, Safe, Accessible -Medications  previously tried: None  -We discussed:    Difference b/t allopurinol for prevention and colcrys prn, pt was aware -Recommended to continue current medication    [place] Pharmacist [phone]

## 2022-09-07 ENCOUNTER — Ambulatory Visit: Payer: Medicare Other

## 2022-09-12 ENCOUNTER — Other Ambulatory Visit: Payer: Self-pay | Admitting: Family Medicine

## 2022-09-12 DIAGNOSIS — M109 Gout, unspecified: Secondary | ICD-10-CM

## 2022-11-06 ENCOUNTER — Telehealth: Payer: Self-pay

## 2022-11-06 NOTE — Progress Notes (Unsigned)
Care Management & Coordination Services Pharmacy Team  Reason for Encounter: Hypertension  Contacted patient to discuss hypertension disease state. {US HC Outreach:28874}    Current antihypertensive regimen:  Amlodipine 5 mg daily Patient verbally confirms she is taking the above medications as directed. {yes/no:20286}  How often are you checking your Blood Pressure? {CHL HP BP Monitoring Frequency:[id number]}  she checks her blood pressure {timing:25218} {before/after:25217} taking her medication.  Current home BP readings: *** DATE:             BP               PULSE   Wrist or arm cuff:  OTC medications including pseudoephedrine or NSAIDs?  Any readings above 180/100? {yes/no:20286} If yes any symptoms of hypertensive emergency? {hypertensive emergency symptoms:25354}  What recent interventions/DTPs have been made by any provider to improve Blood Pressure control since last CPP Visit: No recent interventions  Any recent hospitalizations or ED visits since last visit with CPP? No recent hospital visits.  What diet changes have been made to improve Blood Pressure Control?  Patient follows a low sodium diet Breakfast  - peanut butter and banana sandwich Lunch - patient doesn't eat lunch Dinner - mostly vegetables and a little meat Snack - a snack to take with her evening medication, she doesn't snack between meals Caffeine intake: Salt intake:  What exercise is being done to improve your Blood Pressure Control?  Patient works out with a Psychologist, educational once weekly and stays active in her home every day.   Adherence Review: Is the patient currently on ACE/ARB medication? No Does the patient have >5 day gap between last estimated fill dates? No  Care Gaps: AWV - completed 04/27/2022 Next appt - 03/05/2023 Shingrix - never done Covid - overdue  Star Rating Drugs: Simvastatin 20 mg - last filled [facility]/01/202490 [facility]  Chart Updates: Recent office visits:   None  Recent consult visits:  None  Hospital visits:  None  Medications: Outpatient Encounter Medications as of 11/06/2022  Medication Sig Note   acetaminophen (TYLENOL) 650 MG CR tablet Take 650 mg by mouth every 8 (eight) hours as needed for pain (pain).  07/18/2014: Pt stated she "threw up" medication this past week.   allopurinol (ZYLOPRIM) 100 MG tablet TAKE 1 TABLET(100 MG) BY MOUTH DAILY    amLODipine (NORVASC) 5 MG tablet Take 1 tablet (5 mg total) by mouth daily.    aspirin 81 MG EC tablet Take 81 mg by mouth daily.   07/18/2014: Pt stated she "threw up" medication this past week.    calcitRIOL (ROCALTROL) 0.25 MCG capsule TAKE 1 CAPSULE BY MOUTH EVERY OTHER DAY    cetirizine (ZYRTEC) 10 MG tablet Take 10 mg by mouth daily.   07/18/2014: Pt stated she "threw up" medication this past week.    colchicine 0.6 MG tablet Take 1 tablet by mouth twice daily for the first 7-10 days to prevent gout attack due to allopurinol dosage change.    furosemide (LASIX) 40 MG tablet TAKE 1 TABLET(40 MG) BY MOUTH DAILY    levothyroxine (SYNTHROID) 50 MCG tablet TAKE 1 TABLET BY MOUTH ONCE DAILY, ALTERNATING WITH 1/2 TABLET BY MOUTH DAILY    potassium chloride SA (KLOR-CON M) 20 MEQ tablet TAKE 1 TABLET(20 MEQ) BY MOUTH DAILY    simvastatin (ZOCOR) 20 MG tablet Take 1 tablet (20 mg total) by mouth at bedtime.    No facility-administered encounter medications on file as of 11/06/2022.  Fill History:  Dispensed Days Supply Quantity Provider Pharmacy  ALLOPURINOL 100MG  TABLETS 10/22/2022 90 90 each      Dispensed Days Supply Quantity Provider Pharmacy  AMLODIPINE BESYLATE  5 MG TABS 11/05/2022 90 90 tablet      Dispensed Days Supply Quantity Provider Pharmacy  CALCITRIOL 0.25MCG CAPSULES 10/23/2022 90 45 each      Dispensed Days Supply Quantity Provider Pharmacy  COLCHICINE 0.6MG  TABLETS 05/06/2021 69 20 each      Dispensed Days Supply Quantity Provider Pharmacy  FUROSEMIDE 40MG  TABLETS 09/12/2022  90 90 each      Dispensed Days Supply Quantity Provider Pharmacy  LEVOTHYROXINE 0.05MG  ( ) TAB 10/23/2022 90 70 each      Dispensed Days Supply Quantity Provider Pharmacy  POTASSIUM CL ER TABLETS 08/18/2022 90 90 each      Dispensed Days Supply Quantity Provider Pharmacy  SIMVASTATIN 20MG  TABLETS 11/04/2022 90 90 each     Recent Office Vitals: BP Readings from Last 3 Encounters:  04/29/22 128/70  04/23/21 130/80  03/27/20 130/78   Pulse Readings from Last 3 Encounters:  04/29/22 86  04/23/21 94  03/27/20 100    Wt Readings from Last 3 Encounters:  04/29/22 207 lb 4 oz (94 kg)  04/27/22 210 lb (95.3 kg)  04/23/21 203 lb (92.1 kg)     Kidney Function Lab Results  Component Value Date/Time   CREATININE 1.2 (A) 05/21/2022 12:00 AM   CREATININE 1.12 04/23/2021 11:46 AM   CREATININE 1.42 (H) 04/07/2017 03:20 PM   GFR 44.82 (L) 04/23/2021 11:46 AM   GFRNONAA 38 (L) 07/26/2014 04:45 AM   GFRAA 44 (L) 07/26/2014 04:45 AM       Latest Ref Rng & Units 05/21/2022   12:00 AM 04/23/2021   11:46 AM 03/28/2019   10:20 AM  BMP  Glucose 70 - 99 mg/dL  409    BUN 4 - 21 17     20     Creatinine 0.5 - 1.1 1.2     1.12    Sodium 137 - 147 139     138    Potassium 3.5 - 5.1 mEq/L 3.9     3.6  3.8   Chloride 99 - 108 98     100    CO2 13 - 22 25     27     Calcium 8.7 - 10.7 9.6     9.9       This result is from an external source.    Inetta Fermo [facility]  Clinical Pharmacist Assistant [phone]

## 2022-11-13 ENCOUNTER — Ambulatory Visit (INDEPENDENT_AMBULATORY_CARE_PROVIDER_SITE_OTHER): Payer: Medicare Other | Admitting: Family Medicine

## 2022-11-13 ENCOUNTER — Encounter: Payer: Self-pay | Admitting: Family Medicine

## 2022-11-13 VITALS — BP 124/66 | HR 86 | Temp 98.8°F

## 2022-11-13 DIAGNOSIS — H00011 Hordeolum externum right upper eyelid: Secondary | ICD-10-CM | POA: Diagnosis not present

## 2022-11-13 DIAGNOSIS — H1031 Unspecified acute conjunctivitis, right eye: Secondary | ICD-10-CM | POA: Diagnosis not present

## 2022-11-13 MED ORDER — CIPROFLOXACIN HCL 0.3 % OP SOLN: 1.0000 [drp] | OPHTHALMIC | 0 refills | Status: AC

## 2022-11-13 NOTE — Progress Notes (Signed)
Established Patient Office Visit   Subjective  Patient ID: [name], female    DOB: [birth year]  Age: 89 y.o. MRN: [id number]  Chief Complaint  Patient presents with   Eye Problem    Not sure I it is a stye or pink eye. Started late wed, started out being sensitive, then yesterday was red and tearing up and itching. Has only washed her face, nothing more     Pt is an 89 yo female followed by Dr. [place] and seen for acute concern.  Pt with redness and edema of R upper eyelid starting Tues or Wed (3-4 d ago).  Symptoms worsening.  Eye feels itchy with clear drainage causing slightly blurred vision.  Denies pain of the eye.  Eyelid sensitive to touch.  Denies burning sensation around eye or skin lesions.  Patient endorses mild rhinorrhea.  Has a history of seasonal allergies.  Taking Zyrtec daily.  Eye Problem  The right eye is affected. This is a new problem. The current episode started in the past 7 days. The problem occurs constantly. The problem has been gradually worsening. There was no injury mechanism. The patient is experiencing no pain. There is No known exposure to pink eye. She Does not wear contacts. Associated symptoms include blurred vision, an eye discharge, eye redness and itching. Pertinent negatives include no double vision, fever, foreign body sensation or recent URI.    Patient Active Problem List   Diagnosis Date Noted   Prediabetes 04/23/2021   Secondary hypoparathyroidism 03/24/2019   Hot flashes not due to menopause 02/13/2016   Abdominal pain    Abnormal x-ray of abdomen    Mitral regurgitation 07/22/2014   Sinus pause 07/20/2014   Atrial fibrillation with controlled ventricular response (HCC) 07/20/2014   SBO (small bowel obstruction) (HCC) 07/18/2014   CKD (chronic kidney disease) stage 3, GFR 30-59 ml/min (HCC) 07/18/2014   Small bowel obstruction (HCC) 07/18/2014   Acute on chronic renal failure (HCC) 07/18/2014   Asthma exacerbation, non-allergic  07/03/2011   COUGH 02/10/2010   Hypothyroidism 02/08/2009   Hyperlipidemia 01/19/2008   Gouty arthropathy 01/19/2008   DYSPLASTIC NEVUS, BACK 06/17/2007   Essential hypertension 12/02/2006   Allergic rhinitis 12/02/2006   Osteoarthritis 12/02/2006   Past Surgical History:  Procedure Laterality Date   BREAST BIOPSY Left    CATARACT EXTRACTION     CHOLECYSTECTOMY     TOTAL ABDOMINAL HYSTERECTOMY W/ BILATERAL SALPINGOOPHORECTOMY     TOTAL KNEE ARTHROPLASTY     Social History   Tobacco Use   Smoking status: Never   Smokeless tobacco: Never  Vaping Use   Vaping Use: Never used  Substance Use Topics   Alcohol use: No   Drug use: No   Family History  Problem Relation Age of Onset   Breast cancer Daughter 51   Breast cancer Cousin 37   Diabetes Other        fhx   Hypertension Other        fhx   Obesity Other        fhx   Allergies  Allergen Reactions   Drug Ingredient [Sulfamethoxazole-Trimethoprim]     Lips edema,dizziness,and shaky sensation.   Shellfish Allergy       Review of Systems  Constitutional:  Negative for fever.  Eyes:  Positive for blurred vision, discharge, redness and itching. Negative for double vision.   Negative unless stated above    Objective:     BP 124/66 (BP Location: Right Arm, Patient  Position: Sitting, Cuff Size: Large)   Pulse 86   Temp 98.8 F (37.1 C) (Oral)   SpO2 95%    Physical Exam Constitutional:      General: She is not in acute distress.    Appearance: Normal appearance.  HENT:     Head: Normocephalic and atraumatic.     Nose: Nose normal.     Mouth/Throat:     Mouth: Mucous membranes are moist.  Eyes:     General:        Right eye: Discharge present.     Extraocular Movements: Extraocular movements intact.     Conjunctiva/sclera:     Right eye: Right conjunctiva is injected.     Comments: Right upper eyelid edema with what appears to be a papule forming in corner of upper right eyelid.  Conjunctival  injection of right eye.  Clear drainage from right eye.  Mild right eye erythema periorbital  Cardiovascular:     Rate and Rhythm: Normal rate and regular rhythm.     Heart sounds: Normal heart sounds. No murmur heard.    No gallop.  Pulmonary:     Effort: Pulmonary effort is normal. No respiratory distress.     Breath sounds: Normal breath sounds. No wheezing, rhonchi or rales.  Skin:    General: Skin is warm and dry.  Neurological:     Mental Status: She is alert and oriented to person, place, and time.    No results found for any visits on 11/13/22.    Assessment & Plan:  Acute conjunctivitis of right eye, unspecified acute conjunctivitis type -     Ciprofloxacin HCl; Place 1 drop into the right eye every 4 (four) hours while awake.  Dispense: 5 mL; Refill: 0  Hordeolum externum of right upper eyelid   Advised symptoms likely 2/2 acute allergic conjunctivitis.  Send in Rx for ABX eyedrops given the weekend.  Warm compresses for stye forming.  Discussed hand hygiene.  Given precautions.   Return if symptoms worsen or fail to improve.   Deeann Saint, MD

## 2022-11-14 ENCOUNTER — Other Ambulatory Visit: Payer: Self-pay | Admitting: Family Medicine

## 2022-11-14 DIAGNOSIS — E876 Hypokalemia: Secondary | ICD-10-CM

## 2023-01-27 DIAGNOSIS — N1831 Chronic kidney disease, stage 3a: Secondary | ICD-10-CM | POA: Diagnosis not present

## 2023-01-27 DIAGNOSIS — N184 Chronic kidney disease, stage 4 (severe): Secondary | ICD-10-CM | POA: Diagnosis not present

## 2023-01-27 DIAGNOSIS — N2581 Secondary hyperparathyroidism of renal origin: Secondary | ICD-10-CM | POA: Diagnosis not present

## 2023-01-27 DIAGNOSIS — D631 Anemia in chronic kidney disease: Secondary | ICD-10-CM | POA: Diagnosis not present

## 2023-01-27 DIAGNOSIS — I129 Hypertensive chronic kidney disease with stage 1 through stage 4 chronic kidney disease, or unspecified chronic kidney disease: Secondary | ICD-10-CM | POA: Diagnosis not present

## 2023-01-29 ENCOUNTER — Other Ambulatory Visit: Payer: Self-pay | Admitting: Family Medicine

## 2023-01-29 DIAGNOSIS — E785 Hyperlipidemia, unspecified: Secondary | ICD-10-CM

## 2023-02-05 ENCOUNTER — Other Ambulatory Visit: Payer: Self-pay | Admitting: Family Medicine

## 2023-02-05 DIAGNOSIS — I10 Essential (primary) hypertension: Secondary | ICD-10-CM

## 2023-02-09 ENCOUNTER — Other Ambulatory Visit: Payer: Self-pay | Admitting: Family Medicine

## 2023-02-09 DIAGNOSIS — Z0000 Encounter for general adult medical examination without abnormal findings: Secondary | ICD-10-CM

## 2023-03-03 ENCOUNTER — Ambulatory Visit
Admission: RE | Admit: 2023-03-03 | Discharge: 2023-03-03 | Disposition: A | Discharge status: Home / Self Care | Payer: Medicare Other | Source: Ambulatory Visit | Attending: Family Medicine | Admitting: Family Medicine

## 2023-03-03 DIAGNOSIS — Z0000 Encounter for general adult medical examination without abnormal findings: Secondary | ICD-10-CM

## 2023-03-03 DIAGNOSIS — Z1231 Encounter for screening mammogram for malignant neoplasm of breast: Secondary | ICD-10-CM | POA: Diagnosis not present

## 2023-04-07 DIAGNOSIS — H35373 Puckering of macula, bilateral: Secondary | ICD-10-CM | POA: Diagnosis not present

## 2023-04-07 DIAGNOSIS — H43813 Vitreous degeneration, bilateral: Secondary | ICD-10-CM | POA: Diagnosis not present

## 2023-04-07 DIAGNOSIS — H353122 Nonexudative age-related macular degeneration, left eye, intermediate dry stage: Secondary | ICD-10-CM | POA: Diagnosis not present

## 2023-04-07 DIAGNOSIS — E119 Type 2 diabetes mellitus without complications: Secondary | ICD-10-CM | POA: Diagnosis not present

## 2023-04-07 DIAGNOSIS — H5213 Myopia, bilateral: Secondary | ICD-10-CM | POA: Diagnosis not present

## 2023-04-07 DIAGNOSIS — H524 Presbyopia: Secondary | ICD-10-CM | POA: Diagnosis not present

## 2023-04-07 LAB — HM DIABETES EYE EXAM

## 2023-04-19 ENCOUNTER — Other Ambulatory Visit: Payer: Self-pay | Admitting: Family Medicine

## 2023-04-19 DIAGNOSIS — M109 Gout, unspecified: Secondary | ICD-10-CM

## 2023-04-26 ENCOUNTER — Other Ambulatory Visit: Payer: Self-pay | Admitting: Family Medicine

## 2023-04-26 DIAGNOSIS — E039 Hypothyroidism, unspecified: Secondary | ICD-10-CM

## 2023-04-29 ENCOUNTER — Ambulatory Visit: Payer: Medicare Other | Admitting: Family Medicine

## 2023-04-29 DIAGNOSIS — Z0000 Encounter for general adult medical examination without abnormal findings: Secondary | ICD-10-CM | POA: Diagnosis not present

## 2023-04-29 NOTE — Patient Instructions (Signed)
I really enjoyed getting to talk with you today! I am available on Tuesdays and Thursdays for virtual visits if you have any questions or concerns, or if I can be of any further assistance.   CHECKLIST FROM ANNUAL WELLNESS VISIT:  -Follow up (please call to schedule if not scheduled after visit):   -yearly for annual wellness visit with primary care office  Here is a list of your preventive care/health maintenance measures and the plan for each if any are due:  PLAN For any measures below that may be due:   Health Maintenance  Topic Date Due   Zoster Vaccines- Shingrix (1 of 2) 10/03/1952   INFLUENZA VACCINE  02/04/2023   COVID-19 Vaccine (5 - 2023-24 season) 03/07/2023   Medicare Annual Wellness (AWV)  04/28/2024   DTaP/Tdap/Td (3 - Td or Tdap) 03/03/2027   Pneumonia Vaccine 59+ Years old  Completed   DEXA SCAN  Completed   HPV VACCINES  Aged Out    -See a dentist at least yearly  -Get your eyes checked and then per your eye specialist's recommendations  -Other issues addressed today:   -I have included below further information regarding a healthy whole foods based diet, physical activity guidelines for adults, stress management and opportunities for social connections. I hope you find this information useful.   -----------------------------------------------------------------------------------------------------------------------------------------------------------------------------------------------------------------------------------------------------------  NUTRITION: -eat real food: lots of colorful vegetables (half the plate) and fruits -5-7 servings of vegetables and fruits per day (fresh or steamed is best), exp. 2 servings of vegetables with lunch and dinner and 2 servings of fruit per day. Berries and greens such as kale and collards are great choices.  -consume on a regular basis: whole grains (make sure first ingredient on label contains the word "whole"), fresh  fruits, fish, nuts, seeds, healthy oils (such as olive oil, avocado oil, grape seed oil) -may eat small amounts of dairy and lean meat on occasion, but avoid processed meats such as ham, bacon, lunch meat, etc. -drink water -try to avoid fast food and pre-packaged foods, processed meat -most experts advise limiting sodium to < 2300mg  per day, should limit further is any chronic conditions such as high blood pressure, heart disease, diabetes, etc. The American Heart Association advised that < 1500mg  is is ideal -try to avoid foods that contain any ingredients with names you do not recognize  -try to avoid sugar/sweets (except for the natural sugar that occurs in fresh fruit) -try to avoid sweet drinks -try to avoid white rice, white bread, pasta (unless whole grain), white or yellow potatoes  EXERCISE GUIDELINES FOR ADULTS: -if you wish to increase your physical activity, do so gradually and with the approval of your doctor -STOP and seek medical care immediately if you have any chest pain, chest discomfort or trouble breathing when starting or increasing exercise  -move and stretch your body, legs, feet and arms when sitting for long periods -Physical activity guidelines for optimal health in adults: -least 150 minutes per week of aerobic exercise (can talk, but not sing) once approved by your doctor, 20-30 minutes of sustained activity or two 10 minute episodes of sustained activity every day.  -resistance training at least 2 days per week if approved by your doctor -balance exercises 3+ days per week:   Stand somewhere where you have something sturdy to hold onto if you lose balance.    1) lift up on toes, start with 5x per day and work up to 20x   2) stand and lift on  leg straight out to the side so that foot is a few inches of the floor, start with 5x each side and work up to 20x each side   3) stand on one foot, start with 5 seconds each side and work up to 20 seconds on each side  If you  need ideas or help with getting more active:  -Silver sneakers https://tools.silversneakers.com  -Walk with a Doc: http://www.duncan-williams.com/  -try to include resistance (weight lifting/strength building) and balance exercises twice per week: or the following link for ideas: http://castillo-powell.com/  BuyDucts.dk  STRESS MANAGEMENT: -can try meditating, or just sitting quietly with deep breathing while intentionally relaxing all parts of your body for 5 minutes daily -if you need further help with stress, anxiety or depression please follow up with your primary doctor or contact the wonderful folks at [facility]: [phone]  SOCIAL CONNECTIONS: -options in [place] if you wish to engage in more social and exercise related activities:  -Silver sneakers https://tools.silversneakers.com  -Walk with a Doc: http://www.duncan-williams.com/  -Check out the [facility] Active Adults 50+ section on the [place] of [employer] (hiking clubs, book clubs, cards and games, chess, exercise classes, aquatic classes and much more) - see the website for details: https://www.[place].gov/departments/parks-recreation/active-adults50  -YouTube has lots of exercise videos for different ages and abilities as well  -[facility] (a variety of indoor and outdoor inperson activities for adults). [phone]. [address].  -Virtual Online Classes (a variety of topics): see seniorplanet.org or call [phone]  -consider volunteering at a school, hospice center, church, senior center or elsewhere

## 2023-04-29 NOTE — Progress Notes (Signed)
 Patient unable to obtain vital signs due to telehealth visit

## 2023-04-29 NOTE — Progress Notes (Signed)
PATIENT CHECK-IN and HEALTH RISK ASSESSMENT QUESTIONNAIRE:  -completed by phone/video for upcoming Medicare Preventive Visit  Pre-Visit Check-in: 1)Vitals (height, wt, BP, etc) - record in vitals section for visit on day of visit Request home vitals (wt, BP, etc.) and enter into vitals, THEN update Vital Signs SmartPhrase below at the top of the HPI. See below.  2)Review and Update Medications, Allergies PMH, Surgeries, Social history in Epic 3)Hospitalizations in the last year with date/reason? No  4)Review and Update Care Team (patient's specialists) in Epic 5) Complete PHQ9 in Epic  6) Complete Fall Screening in Epic 7)Review all Health Maintenance Due and order under PCP if not done.  8)Medicare Wellness Questionnaire: Answer theses question about your habits: Do you drink alcohol? No If yes, how many drinks do you have a day? Have you ever smoked?Yes  Quit date if applicable? N/A   How many packs a day do/did you smoke? Less than 1  Do you use smokeless tobacco?No Do you use an illicit drugs?No  Do you exercises? Yes IF so, what type and how many days/minutes per week?1 day 45 minutes - works out with a trainer once a week Are you sexually active? No Number of partners?N/A Typical breakfast: Banana and Peanut butter Typical lunch: Varies - reports eats plenty of fruits and veggies Typical dinner: Varies  Typical snacks: Popcorn and fruit  Beverages: Water, Tea, Soda  Answer theses question about you: Can you perform most household chores?Yes  Do you find it hard to follow a conversation in a noisy room?No Do you often ask people to speak up or repeat themselves?sometimes Do you feel that you have a problem with memory?No Do you balance your checkbook and or bank acounts? Yes Do you feel safe at home? Yes  Last dentist visit? 03/28/2023 Do you need assistance with any of the following: Please note if so No  Driving?  Feeding yourself?  Getting from bed to chair?  Getting  to the toilet?  Bathing or showering?  Dressing yourself?  Managing money?  Climbing a flight of stairs  Preparing meals?  Do you have Advanced Directives in place (Living Will, Healthcare Power or Attorney)?  Yes   Last eye Exam and location? Unknown - had an eye exam about 3 weeks ago with Dr. Burgess Estelle   Do you currently use prescribed or non-prescribed narcotic or opioid pain medications? No   Do you have a history or close family history of breast, ovarian, tubal or peritoneal cancer or a family member with BRCA (breast cancer susceptibility 1 and 2) gene mutations? Yes Ovarian    Nurse/Assistant Credentials/time stamp: MG 11:46 AM    ----------------------------------------------------------------------------------------------------------------------------------------------------------------------------------------------------------------------  Because this visit was a virtual/telehealth visit, some criteria may be missing or patient reported. Any vitals not documented were not able to be obtained and vitals that have been documented are patient reported.    MEDICARE ANNUAL PREVENTIVE VISIT WITH PROVIDER: (Welcome to Medicare, initial annual wellness or annual wellness exam)  Virtual Visit via Phone Note  I connected with [name] on 04/29/23 by phone and verified that I am speaking with the correct person using two identifiers.  Location patient: home Location provider:work or home office Persons participating in the virtual visit: patient, provider  Concerns and/or follow up today: no concerns   See HM section in Epic for other details of completed HM.    ROS: negative for report of fevers, unintentional weight loss, vision changes, vision loss, hearing loss or change, chest pain,  sob, hemoptysis, melena, hematochezia, hematuria, falls, bleeding or bruising, thoughts of suicide or self harm, memory loss  Patient-completed extensive health risk assessment -  reviewed and discussed with the patient: See Health Risk Assessment completed with patient prior to the visit either above or in recent phone note. This was reviewed in detailed with the patient today and appropriate recommendations, orders and referrals were placed as needed per Summary below and patient instructions.   Review of Medical History: -PMH, PSH, Family History and current specialty and care providers reviewed and updated and listed below   Patient Care Team: [place], Betty G, MD as PCP - General (Family Medicine) Sherrill Raring, [facility] (Pharmacist)   Past Medical History:  Diagnosis Date   Allergy    Arthritis    Broken foot    left   Cancer (HCC)    ovarian   CKD (chronic kidney disease), stage III (HCC)    Hyperlipidemia    Hypertension    Obesity    Thyroid disease     Past Surgical History:  Procedure Laterality Date   BREAST BIOPSY Left    CATARACT EXTRACTION     CHOLECYSTECTOMY     TOTAL ABDOMINAL HYSTERECTOMY W/ BILATERAL SALPINGOOPHORECTOMY     TOTAL KNEE ARTHROPLASTY      Social History   Socioeconomic History   Marital status: Married    Spouse name: Not on file   Number of children: Not on file   Years of education: Not on file   Highest education level: Not on file  Occupational History   Not on file  Tobacco Use   Smoking status: Never   Smokeless tobacco: Never  Vaping Use   Vaping status: Never Used  Substance and Sexual Activity   Alcohol use: No   Drug use: No   Sexual activity: Not Currently  Other Topics Concern   Not on file  Social History Narrative   Not on file   Social Determinants of Health   Financial Resource Strain: Low Risk  (04/29/2023)   Overall Financial Resource Strain (CARDIA)    Difficulty of Paying Living Expenses: Not hard at all  Food Insecurity: No Food Insecurity (09/07/2022)   Hunger Vital Sign    Worried About Running Out of Food in the Last Year: Never true    Ran Out of Food in the Last Year:  Never true  Transportation Needs: No Transportation Needs (09/07/2022)   PRAPARE - Administrator, Civil Service (Medical): No    Lack of Transportation (Non-Medical): No  Physical Activity: Insufficiently Active (04/29/2023)   Exercise Vital Sign    Days of Exercise per Week: 1 day    Minutes of Exercise per Session: 40 min  Stress: No Stress Concern Present (04/29/2023)   [employer] of Occupational Health - Occupational Stress Questionnaire    Feeling of Stress : Not at all  Social Connections: Moderately Isolated (04/29/2023)   Social Connection and Isolation Panel [NHANES]    Frequency of Communication with Friends and Family: More than three times a week    Frequency of Social Gatherings with Friends and Family: Once a week    Attends Religious Services: More than 4 times per year    Active Member of [employer] or Organizations: No    Attends Banker Meetings: Never    Marital Status: Widowed  Intimate Partner Violence: Not At Risk (04/29/2023)   Humiliation, Afraid, Rape, and Kick questionnaire    Fear of Current or  Ex-Partner: No    Emotionally Abused: No    Physically Abused: No    Sexually Abused: No    Family History  Problem Relation Age of Onset   Breast cancer Daughter 29   Breast cancer Cousin 37   Diabetes Other        fhx   Hypertension Other        fhx   Obesity Other        fhx    Current Outpatient Medications on File Prior to Visit  Medication Sig Dispense Refill   acetaminophen (TYLENOL) 650 MG CR tablet Take 650 mg by mouth every 8 (eight) hours as needed for pain (pain).      allopurinol (ZYLOPRIM) 100 MG tablet TAKE 1 TABLET(100 MG) BY MOUTH DAILY 90 tablet 3   amLODipine (NORVASC) 5 MG tablet TAKE 1 TABLET(5 MG) BY MOUTH DAILY 90 tablet 1   aspirin 81 MG EC tablet Take 81 mg by mouth daily.       cetirizine (ZYRTEC) 10 MG tablet Take 10 mg by mouth daily.       ciprofloxacin (CILOXAN) 0.3 % ophthalmic solution Place 1  drop into the right eye every 4 (four) hours while awake. 5 mL 0   colchicine 0.6 MG tablet Take 1 tablet by mouth twice daily for the first 7-10 days to prevent gout attack due to allopurinol dosage change. 20 tablet 0   furosemide (LASIX) 40 MG tablet TAKE 1 TABLET(40 MG) BY MOUTH DAILY 90 tablet 3   levothyroxine (SYNTHROID) 50 MCG tablet TAKE 1 TABLET BY MOUTH ONCE DAILY, ALTERNATING WITH 1/2 TABLET DAILY 70 tablet 2   potassium chloride SA (KLOR-CON M) 20 MEQ tablet TAKE 1 TABLET(20 MEQ) BY MOUTH DAILY 90 tablet 1   simvastatin (ZOCOR) 20 MG tablet TAKE 1 TABLET(20 MG) BY MOUTH AT BEDTIME 90 tablet 3   Vitamin D, Ergocalciferol, (DRISDOL) 1.25 MG (50000 UNIT) CAPS capsule Take 50,000 Units by mouth once a week.     No current facility-administered medications on file prior to visit.    Allergies  Allergen Reactions   Drug Ingredient [Sulfamethoxazole-Trimethoprim]     Lips edema,dizziness,and shaky sensation.   Shellfish Allergy        Physical Exam Vitals requested from patient and listed below if patient had equipment and was able to obtain at home for this virtual visit: There were no vitals filed for this visit. Estimated body mass index is 37.3 kg/m as calculated from the following:   Height as of 04/29/22: 5' 2.5" (1.588 m).   Weight as of 04/29/22: 207 lb 4 oz (94 kg).  EKG (optional): deferred due to virtual visit  GENERAL: alert, oriented, no acute distress detected, full vision exam deferred due to pandemic and/or virtual encounter  PSYCH/NEURO: pleasant and cooperative, no obvious depression or anxiety, speech and thought processing grossly intact, Cognitive function grossly intact        04/29/2023   11:34 AM 04/29/2022   10:59 AM 04/27/2022    1:03 PM 04/23/2021   11:02 AM 03/27/2020   11:13 AM  Depression screen PHQ 2/9  Decreased Interest 0 0 0 0 0  Down, Depressed, Hopeless 0 0 0 0 0  PHQ - 2 Score 0 0 0 0 0       03/30/2020    6:29 PM 04/23/2021    11:02 AM 04/27/2022    1:03 PM 04/29/2022   10:58 AM 04/29/2023   11:34 AM  Fall Risk  Falls  in the past year? 1 0 0 0 1  Was there an injury with Fall? 0 0 0 0 0  Fall Risk Category Calculator 1 0 0 0 1  Fall Risk Category (Retired) Low Low Low Low   (RETIRED) Patient Fall Risk Level Low fall risk Low fall risk Low fall risk Low fall risk   Patient at Risk for Falls Due to  Impaired balance/gait Medication side effect Other (Comment) No Fall Risks  Fall risk Follow up Education provided Education provided Falls prevention discussed;Falls evaluation completed;Education provided Falls evaluation completed Falls evaluation completed   Last fall and she was going downs stairs and lost balance, no broken bones. Uses cane.   SUMMARY AND PLAN:  Encounter for Medicare annual wellness exam   Discussed applicable health maintenance/preventive health measures and advised and referred or ordered per patient preferences: -plans to get her flu shot next week in the office -she is not sure about the covid or shingles shots and reports she will consider and will discuss with her PCP - advised per cdc.  Health Maintenance  Topic Date Due   Zoster Vaccines- Shingrix (1 of 2) 10/03/1952   INFLUENZA VACCINE  02/04/2023   COVID-19 Vaccine (5 - 2023-24 season) 03/07/2023   Medicare Annual Wellness (AWV)  04/28/2024   DTaP/Tdap/Td (3 - Td or Tdap) 03/03/2027   Pneumonia Vaccine 19+ Years old  Completed   DEXA SCAN  Completed   HPV VACCINES  Aged Out      Education and counseling on the following was provided based on the above review of health and a plan/checklist for the patient, along with additional information discussed, was provided for the patient in the patient instructions :   -Provided counseling and plan for increased risk of falling if applicable per above screening. Reviewed safe balance exercises that can be done at home to improve balance and discussed exercise guidelines for  adults with include balance exercises at least 3 days per week.  -Advised and counseled on a healthy lifestyle  -Reviewed patient's current diet. Advised and counseled on a whole foods based healthy diet. A summary of a healthy diet was provided in the Patient Instructions.  -reviewed patient's current physical activity level and discussed exercise guidelines for adults. Discussed community resources and ideas for safe exercise at home to assist in meeting exercise guideline recommendations in a safe and healthy way.  -Advise yearly dental visits at minimum and regular eye exams   Follow up: see patient instructions     Patient Instructions  I really enjoyed getting to talk with you today! I am available on Tuesdays and Thursdays for virtual visits if you have any questions or concerns, or if I can be of any further assistance.   CHECKLIST FROM ANNUAL WELLNESS VISIT:  -Follow up (please call to schedule if not scheduled after visit):   -yearly for annual wellness visit with primary care office  Here is a list of your preventive care/health maintenance measures and the plan for each if any are due:  PLAN For any measures below that may be due:   Health Maintenance  Topic Date Due   Zoster Vaccines- Shingrix (1 of 2) 10/03/1952   INFLUENZA VACCINE  02/04/2023   COVID-19 Vaccine (5 - 2023-24 season) 03/07/2023   Medicare Annual Wellness (AWV)  04/28/2024   DTaP/Tdap/Td (3 - Td or Tdap) 03/03/2027   Pneumonia Vaccine 57+ Years old  Completed   DEXA SCAN  Completed   HPV VACCINES  Aged Out    -See a dentist at least yearly  -Get your eyes checked and then per your eye specialist's recommendations  -Other issues addressed today:   -I have included below further information regarding a healthy whole foods based diet, physical activity guidelines for adults, stress management and opportunities for social connections. I hope you find this information useful.    -----------------------------------------------------------------------------------------------------------------------------------------------------------------------------------------------------------------------------------------------------------  NUTRITION: -eat real food: lots of colorful vegetables (half the plate) and fruits -5-7 servings of vegetables and fruits per day (fresh or steamed is best), exp. 2 servings of vegetables with lunch and dinner and 2 servings of fruit per day. Berries and greens such as kale and collards are great choices.  -consume on a regular basis: whole grains (make sure first ingredient on label contains the word "whole"), fresh fruits, fish, nuts, seeds, healthy oils (such as olive oil, avocado oil, grape seed oil) -may eat small amounts of dairy and lean meat on occasion, but avoid processed meats such as ham, bacon, lunch meat, etc. -drink water -try to avoid fast food and pre-packaged foods, processed meat -most experts advise limiting sodium to < 2300mg  per day, should limit further is any chronic conditions such as high blood pressure, heart disease, diabetes, etc. The American Heart Association advised that < 1500mg  is is ideal -try to avoid foods that contain any ingredients with names you do not recognize  -try to avoid sugar/sweets (except for the natural sugar that occurs in fresh fruit) -try to avoid sweet drinks -try to avoid white rice, white bread, pasta (unless whole grain), white or yellow potatoes  EXERCISE GUIDELINES FOR ADULTS: -if you wish to increase your physical activity, do so gradually and with the approval of your doctor -STOP and seek medical care immediately if you have any chest pain, chest discomfort or trouble breathing when starting or increasing exercise  -move and stretch your body, legs, feet and arms when sitting for long periods -Physical activity guidelines for optimal health in adults: -least 150 minutes per week of  aerobic exercise (can talk, but not sing) once approved by your doctor, 20-30 minutes of sustained activity or two 10 minute episodes of sustained activity every day.  -resistance training at least 2 days per week if approved by your doctor -balance exercises 3+ days per week:   Stand somewhere where you have something sturdy to hold onto if you lose balance.    1) lift up on toes, start with 5x per day and work up to 20x   2) stand and lift on leg straight out to the side so that foot is a few inches of the floor, start with 5x each side and work up to 20x each side   3) stand on one foot, start with 5 seconds each side and work up to 20 seconds on each side  If you need ideas or help with getting more active:  -Silver sneakers https://tools.silversneakers.com  -Walk with a Doc: http://www.duncan-williams.com/  -try to include resistance (weight lifting/strength building) and balance exercises twice per week: or the following link for ideas: http://castillo-powell.com/  BuyDucts.dk  STRESS MANAGEMENT: -can try meditating, or just sitting quietly with deep breathing while intentionally relaxing all parts of your body for 5 minutes daily -if you need further help with stress, anxiety or depression please follow up with your primary doctor or contact the wonderful folks at [facility]: [phone]  SOCIAL CONNECTIONS: -options in [place] if you wish to engage in more social and exercise related  activities:  -Silver sneakers https://tools.silversneakers.com  -Walk with a Doc: http://www.duncan-williams.com/  -Check out the [facility] Active Adults 50+ section on the [place] of [employer] (hiking clubs, book clubs, cards and games, chess, exercise classes, aquatic classes and much more) - see the website for  details: https://www.[place]-.gov/departments/parks-recreation/active-adults50  -YouTube has lots of exercise videos for different ages and abilities as well  -[facility] (a variety of indoor and outdoor inperson activities for adults). [phone]. [address].  -Virtual Online Classes (a variety of topics): see seniorplanet.org or call ([phone]) [phone]  -consider volunteering at a school, hospice center, church, senior center or elsewhere           Terressa Koyanagi, DO

## 2023-05-05 ENCOUNTER — Ambulatory Visit (INDEPENDENT_AMBULATORY_CARE_PROVIDER_SITE_OTHER): Payer: Medicare Other | Admitting: Family Medicine

## 2023-05-05 ENCOUNTER — Encounter: Payer: Self-pay | Admitting: Family Medicine

## 2023-05-05 VITALS — BP 128/80 | HR 76 | Temp 98.6°F | Resp 16 | Ht 62.5 in | Wt 207.5 lb

## 2023-05-05 DIAGNOSIS — I10 Essential (primary) hypertension: Secondary | ICD-10-CM | POA: Diagnosis not present

## 2023-05-05 DIAGNOSIS — E039 Hypothyroidism, unspecified: Secondary | ICD-10-CM | POA: Diagnosis not present

## 2023-05-05 DIAGNOSIS — N1831 Chronic kidney disease, stage 3a: Secondary | ICD-10-CM

## 2023-05-05 DIAGNOSIS — E785 Hyperlipidemia, unspecified: Secondary | ICD-10-CM

## 2023-05-05 DIAGNOSIS — Z23 Encounter for immunization: Secondary | ICD-10-CM | POA: Diagnosis not present

## 2023-05-05 DIAGNOSIS — E2089 Other specified hypoparathyroidism: Secondary | ICD-10-CM

## 2023-05-05 DIAGNOSIS — R7303 Prediabetes: Secondary | ICD-10-CM

## 2023-05-05 DIAGNOSIS — I4891 Unspecified atrial fibrillation: Secondary | ICD-10-CM | POA: Diagnosis not present

## 2023-05-05 LAB — LIPID PANEL
Cholesterol: 174 mg/dL (ref 0–200)
HDL: 67.7 mg/dL (ref >39.00)
LDL Cholesterol: 84 mg/dL (ref 0–99)
NonHDL: 106.21
Total CHOL/HDL Ratio: 3
Triglycerides: 113 mg/dL (ref 0.0–149.0)
VLDL: 22.6 mg/dL (ref 0.0–40.0)

## 2023-05-05 LAB — HEPATIC FUNCTION PANEL
ALT: 15 U/L (ref 0–35)
AST: 19 U/L (ref 0–37)
Albumin: 4.3 g/dL (ref 3.5–5.2)
Alkaline Phosphatase: 75 U/L (ref 39–117)
Bilirubin, Direct: 0.1 mg/dL (ref 0.0–0.3)
Total Bilirubin: 0.6 mg/dL (ref 0.2–1.2)
Total Protein: 7.4 g/dL (ref 6.0–8.3)

## 2023-05-05 LAB — HEMOGLOBIN A1C: Hgb A1c MFr Bld: 6.2 % (ref 4.6–6.5)

## 2023-05-05 LAB — TSH: TSH: 3.62 u[IU]/mL (ref 0.35–5.50)

## 2023-05-05 LAB — T4, FREE: Free T4: 1 ng/dL (ref 0.60–1.60)

## 2023-05-05 NOTE — Assessment & Plan Note (Signed)
Encouraged consistency with a healthy lifestyle for diabetes prevention. Further recommendation will be given according to hemoglobin A1c result. 

## 2023-05-05 NOTE — Assessment & Plan Note (Signed)
Problem has been otherwise stable, last blood work on 01/28/2023 with creatinine 1.19 and EGFR 44, protein/creatinine ratio 722. Following with nephrologist.

## 2023-05-05 NOTE — Assessment & Plan Note (Addendum)
She is not longer on Calcitrol 0.25 mg. Following with nephrologist.

## 2023-05-05 NOTE — Assessment & Plan Note (Addendum)
Problem has been adequately controlled. Currently on levothyroxine 50 mcg daily. Further recommendation will be given according to TSH result.

## 2023-05-05 NOTE — Assessment & Plan Note (Signed)
BP adequately controlled. Continue amlodipine 5 mg daily and low salt diet. Continue monitoring BP regularly.

## 2023-05-05 NOTE — Progress Notes (Signed)
Chief Complaint  Patient presents with   Medical Management of Chronic Issues   Shoulder Pain    Right shoulder x 3 weeks, slept on it wrong.   Joint Swelling    HPI: [name] is a 89 y.o. female with a PMHx significant for HTN, HLD, Hypothyroidism, CKD III, gout, and atrial fibrillation, who is here today for chronic disease management.  Last seen on 04/29/2022  Exercise: She works out with a Psychologist, educational once per week, and is active around the house with chores.  Diet: She says her diet is unchanged from her last visit.  Vision: UTD on routine vision care. She sees her eye doctor yearly.   -Hypertension:  Medications: Currently on amlodipine 5 mg daily. She is also taking furosemide 40 mg daily.  BP readings at home: She has not been checking her BP at home. She has some swelling in her ankles, but denies calf pain while walking.  Past hx of atrial fib, she is not longer on anticoagulation. Last EKG 07/2014 NSR.  Negative for unusual or severe headache, visual changes, exertional chest pain, palpitations, dyspnea, or focal weakness. -CKD 3: Following with nephrologist every 6 to 8 months. Creatinine was 1.19 and EGFR 44 on 01/27/2023. Lower extremity edema, no associated erythema or leg/calves pain. She is on furosemide 40 mg daily. -Hypokalemia on K-Lor 20 mK daily. Potassium was 3.9 in 01/2023.  -Hyperlipidemia: Currently on simvastatin 20 mg daily.  Side effects from medication: none Lab Results  Component Value Date   CHOL 175 04/29/2022   HDL 67.00 04/29/2022   LDLCALC 90 04/29/2022   LDLDIRECT 91.4 02/03/2010   TRIG 93.0 04/29/2022   CHOLHDL 3 04/29/2022   -Hypothyroidism: She takes levothyroxine 50 mcg daily.  TSH 3.4 in 04/2022.  -Gout:  She says she hasn't had a gout attack for a long time.  Currently she is on colchicine 0.6 mg daily as needed, she has not taken medication in a while. + Arthralgias, no associated joint edema or erythema. OA, mainly  lower back and knees, s/p left TKR. She uses a cane as needed.  -Vitamin D deficiency and set under he hyperparathyroidism: According to patient, she is no longer on calcitriol.  She was started on high-dose vitamin D to take monthly. Last 25 OH vitamin D was 5.5 in 01/2023.  Review of Systems  Constitutional:  Negative for chills and fever.  Respiratory:  Negative for cough and wheezing.   Gastrointestinal:  Negative for abdominal pain, nausea and vomiting.  Genitourinary:  Negative for decreased urine volume, dysuria and hematuria.  Musculoskeletal:  Positive for arthralgias (Right shoulder x 3 weeks, slept on it wrong.) and gait problem.  Skin:  Negative for rash.  Neurological:  Negative for syncope and facial asymmetry.  Psychiatric/Behavioral:  Negative for confusion and hallucinations.   See other pertinent positives and negatives in HPI.  Current Outpatient Medications on File Prior to Visit  Medication Sig Dispense Refill   acetaminophen (TYLENOL) 650 MG CR tablet Take 650 mg by mouth every 8 (eight) hours as needed for pain (pain).      allopurinol (ZYLOPRIM) 100 MG tablet TAKE 1 TABLET(100 MG) BY MOUTH DAILY 90 tablet 3   amLODipine (NORVASC) 5 MG tablet TAKE 1 TABLET(5 MG) BY MOUTH DAILY 90 tablet 1   aspirin 81 MG EC tablet Take 81 mg by mouth daily.       cetirizine (ZYRTEC) 10 MG tablet Take 10 mg by mouth daily.  ciprofloxacin (CILOXAN) 0.3 % ophthalmic solution Place 1 drop into the right eye every 4 (four) hours while awake. 5 mL 0   colchicine 0.6 MG tablet Take 1 tablet by mouth twice daily for the first 7-10 days to prevent gout attack due to allopurinol dosage change. 20 tablet 0   furosemide (LASIX) 40 MG tablet TAKE 1 TABLET(40 MG) BY MOUTH DAILY 90 tablet 3   levothyroxine (SYNTHROID) 50 MCG tablet TAKE 1 TABLET BY MOUTH ONCE DAILY, ALTERNATING WITH 1/2 TABLET DAILY 70 tablet 2   potassium chloride SA (KLOR-CON M) 20 MEQ tablet TAKE 1 TABLET(20 MEQ) BY MOUTH  DAILY 90 tablet 1   simvastatin (ZOCOR) 20 MG tablet TAKE 1 TABLET(20 MG) BY MOUTH AT BEDTIME 90 tablet 3   Vitamin D, Ergocalciferol, (DRISDOL) 1.25 MG (50000 UNIT) CAPS capsule Take 50,000 Units by mouth once a week.     No current facility-administered medications on file prior to visit.    Past Medical History:  Diagnosis Date   Allergy    Arthritis    Broken foot    left   Cancer (HCC)    ovarian   CKD (chronic kidney disease), stage III (HCC)    Hyperlipidemia    Hypertension    Obesity    Thyroid disease    Allergies  Allergen Reactions   Drug Ingredient [Sulfamethoxazole-Trimethoprim]     Lips edema,dizziness,and shaky sensation.   Shellfish Allergy     Social History   Socioeconomic History   Marital status: Married    Spouse name: Not on file   Number of children: Not on file   Years of education: Not on file   Highest education level: Not on file  Occupational History   Not on file  Tobacco Use   Smoking status: Never   Smokeless tobacco: Never  Vaping Use   Vaping status: Never Used  Substance and Sexual Activity   Alcohol use: No   Drug use: No   Sexual activity: Not Currently  Other Topics Concern   Not on file  Social History Narrative   Not on file   Social Determinants of Health   Financial Resource Strain: Low Risk  (04/29/2023)   Overall Financial Resource Strain (CARDIA)    Difficulty of Paying Living Expenses: Not hard at all  Food Insecurity: No Food Insecurity (09/07/2022)   Hunger Vital Sign    Worried About Running Out of Food in the Last Year: Never true    Ran Out of Food in the Last Year: Never true  Transportation Needs: No Transportation Needs (09/07/2022)   PRAPARE - Administrator, Civil Service (Medical): No    Lack of Transportation (Non-Medical): No  Physical Activity: Insufficiently Active (04/29/2023)   Exercise Vital Sign    Days of Exercise per Week: 1 day    Minutes of Exercise per Session: 40 min   Stress: No Stress Concern Present (04/29/2023)   [employer] of Occupational Health - Occupational Stress Questionnaire    Feeling of Stress : Not at all  Social Connections: Moderately Isolated (04/29/2023)   Social Connection and Isolation Panel [NHANES]    Frequency of Communication with Friends and Family: More than three times a week    Frequency of Social Gatherings with Friends and Family: Once a week    Attends Religious Services: More than 4 times per year    Active Member of [employer] or Organizations: No    Attends Banker Meetings: Never  Marital Status: Widowed    There were no vitals filed for this visit. There is no height or weight on file to calculate BMI.  Physical Exam Vitals and nursing note reviewed.  Constitutional:      General: She is not in acute distress.    Appearance: She is well-developed.  HENT:     Head: Normocephalic and atraumatic.     Mouth/Throat:     Mouth: Mucous membranes are moist.     Pharynx: Oropharynx is clear.  Eyes:     Conjunctiva/sclera: Conjunctivae normal.  Cardiovascular:     Rate and Rhythm: Normal rate and regular rhythm.     Pulses:          Dorsalis pedis pulses are 2+ on the right side and 2+ on the left side.     Heart sounds: No murmur heard. Pulmonary:     Effort: Pulmonary effort is normal. No respiratory distress.     Breath sounds: Normal breath sounds.  Abdominal:     Palpations: Abdomen is soft. There is no hepatomegaly or mass.     Tenderness: There is no abdominal tenderness.  Musculoskeletal:     Right lower leg: Edema present.     Left lower leg: Edema present.  Lymphadenopathy:     Cervical: No cervical adenopathy.  Skin:    General: Skin is warm.     Findings: No erythema or rash.  Neurological:     General: No focal deficit present.     Mental Status: She is alert and oriented to person, place, and time.     Cranial Nerves: No cranial nerve deficit.     Gait: Gait normal.   Psychiatric:     Comments: Well groomed, good eye contact.   ASSESSMENT AND PLAN:  Ms. January was seen today for chronic disease management.   Orders Placed This Encounter  Procedures   Flu Vaccine Trivalent High Dose (Fluad)   Hepatic function panel   Lipid panel   Hemoglobin A1c   T4, free   TSH   Lab Results  Component Value Date   TSH 3.62 05/05/2023   Lab Results  Component Value Date   HGBA1C 6.2 05/05/2023   Lab Results  Component Value Date   ALT 15 05/05/2023   AST 19 05/05/2023   ALKPHOS 75 05/05/2023   BILITOT 0.6 05/05/2023   Prediabetes Assessment & Plan: Encouraged consistency with a healthy lifestyle for diabetes prevention. Further recommendation will be given according to hemoglobin A1c result.  Orders: -     Hemoglobin A1c; Future  Stage 3a chronic kidney disease (HCC) Assessment & Plan: Problem has been otherwise stable, last blood work on 01/28/2023 with creatinine 1.19 and EGFR 44, protein/creatinine ratio 722. Following with nephrologist.   Secondary hypoparathyroidism [facility]) Assessment & Plan: She is not longer on Calcitrol 0.25 mg. Following with nephrologist.   Essential hypertension Assessment & Plan: BP adequately controlled. Continue amlodipine 5 mg daily and low salt diet. Continue monitoring BP regularly.   Atrial fibrillation with controlled ventricular response (HCC) Assessment & Plan: Hx of episode of atrial fib during hospitalization in 07/2014 as well as episodes of sinus pauses, the latter one was attributed to BB she received IV to treat atrial fib. She is on Aspirin 81 mg daily.   Hyperlipidemia, unspecified hyperlipidemia type Assessment & Plan: Continue Simvastatin 20 mg daily and low fat diet. Further recommendations according to FLP results.  Orders: -     Hepatic function panel; Future -  Lipid panel; Future  Hypothyroidism, unspecified type Assessment & Plan: Problem has been adequately  controlled. Currently on levothyroxine 50 mcg daily. Further recommendation will be given according to TSH result.  Orders: -     T4, free; Future -     TSH; Future  Need for influenza vaccination -     Flu Vaccine Trivalent High Dose (Fluad)  We did not discuss shoulder pain in more detail, will recommend Tylenol and range of motion exercises.  If persistent we can consider referral to sports medicine.  I spent a total of 40 minutes in both face to face and non face to face activities for this visit on the date of this encounter. During this time history was obtained and documented, examination was performed, prior labs reviewed, and assessment/plan discussed.  Return in about 1 year (around 05/04/2024) for chronic problems.  I, Suanne Marker, acting as a scribe for Annalisse Minkoff [place], MD., have documented all relevant documentation on the behalf of Brandy Servello [place], MD, as directed by  Yanis Juma [place], MD while in the presence of Kery Batzel [place], MD.   I, Suanne Marker, have reviewed all documentation for this visit. The documentation on 05/05/23 for the exam, diagnosis, procedures, and orders are all accurate and complete.  Sonika Levins G. [place], MD  [facility]. [place] [facility].

## 2023-05-05 NOTE — Assessment & Plan Note (Signed)
Continue Simvastatin 20 mg daily and low fat diet. Further recommendations according to FLP results.

## 2023-05-05 NOTE — Patient Instructions (Addendum)
A few things to remember from today's visit:  Secondary hypoparathyroidism (HCC)  Stage 3a chronic kidney disease (HCC), Chronic  Essential hypertension  Atrial fibrillation with controlled ventricular response (HCC)  Hyperlipidemia, unspecified hyperlipidemia type - Plan: Hepatic function panel, Lipid panel  Prediabetes - Plan: Hemoglobin A1c  Hypothyroidism, unspecified type - Plan: T4, free, TSH  If you need refills for medications you take chronically, please call your pharmacy. Do not use My Chart to request refills or for acute issues that need immediate attention. If you send a my chart message, it may take a few days to be addressed, specially if I am not in the office.  Please be sure medication list is accurate. If a new problem present, please set up appointment sooner than planned today.

## 2023-05-07 NOTE — Assessment & Plan Note (Signed)
Hx of episode of atrial fib during hospitalization in 07/2014 as well as episodes of sinus pauses, the latter one was attributed to BB she received IV to treat atrial fib. She is on Aspirin 81 mg daily.

## 2023-05-15 ENCOUNTER — Other Ambulatory Visit: Payer: Self-pay | Admitting: Family Medicine

## 2023-05-15 DIAGNOSIS — E876 Hypokalemia: Secondary | ICD-10-CM

## 2023-06-07 ENCOUNTER — Other Ambulatory Visit: Payer: Self-pay | Admitting: Family Medicine

## 2023-08-02 ENCOUNTER — Other Ambulatory Visit: Payer: Self-pay | Admitting: Family Medicine

## 2023-08-02 DIAGNOSIS — I10 Essential (primary) hypertension: Secondary | ICD-10-CM

## 2023-09-29 DIAGNOSIS — D631 Anemia in chronic kidney disease: Secondary | ICD-10-CM | POA: Diagnosis not present

## 2023-09-29 DIAGNOSIS — N184 Chronic kidney disease, stage 4 (severe): Secondary | ICD-10-CM | POA: Diagnosis not present

## 2023-09-29 DIAGNOSIS — N2581 Secondary hyperparathyroidism of renal origin: Secondary | ICD-10-CM | POA: Diagnosis not present

## 2023-09-29 DIAGNOSIS — N1831 Chronic kidney disease, stage 3a: Secondary | ICD-10-CM | POA: Diagnosis not present

## 2023-09-29 DIAGNOSIS — I129 Hypertensive chronic kidney disease with stage 1 through stage 4 chronic kidney disease, or unspecified chronic kidney disease: Secondary | ICD-10-CM | POA: Diagnosis not present

## 2023-09-29 LAB — LAB REPORT - SCANNED
Albumin, Urine POC: 133
Creatinine, POC: 81.7 mg/dL
EGFR: 45
Microalb Creat Ratio: 163

## 2023-10-16 ENCOUNTER — Other Ambulatory Visit: Payer: Self-pay | Admitting: Family Medicine

## 2023-10-16 DIAGNOSIS — M109 Gout, unspecified: Secondary | ICD-10-CM

## 2023-10-27 ENCOUNTER — Other Ambulatory Visit: Payer: Self-pay | Admitting: Family Medicine

## 2023-10-27 DIAGNOSIS — N183 Chronic kidney disease, stage 3 unspecified: Secondary | ICD-10-CM

## 2023-10-27 DIAGNOSIS — E2089 Other specified hypoparathyroidism: Secondary | ICD-10-CM

## 2023-10-28 ENCOUNTER — Other Ambulatory Visit: Payer: Self-pay | Admitting: Family Medicine

## 2023-10-28 DIAGNOSIS — E039 Hypothyroidism, unspecified: Secondary | ICD-10-CM

## 2023-10-28 DIAGNOSIS — E876 Hypokalemia: Secondary | ICD-10-CM

## 2023-10-28 NOTE — Telephone Encounter (Signed)
 Copied from CRM [id number]. Topic: Clinical - Medication Refill >> Oct 28, 2023 10:38 AM Juluis Ok wrote: Most Recent Primary Care Visit:  Provider: [place], BETTY G  Department: [facility]  Visit Type: PHYSICAL  Date: 05/05/2023  Medication: levothyroxine  (SYNTHROID ) 50 MCG tablet potassium chloride  SA (KLOR-CON  M) 20 MEQ tablet  Has the patient contacted their pharmacy? Yes (Agent: If no, request that the patient contact the pharmacy for the refill. If patient does not wish to contact the pharmacy document the reason why and proceed with request.) (Agent: If yes, when and what did the pharmacy advise?)  Is this the correct pharmacy for this prescription? Yes If no, delete pharmacy and type the correct one.  This is the patient's preferred pharmacy:  [id number] [place], [place] - [address] AT [facility] OF [facility] [address] & [facility] [address] [place] [address] Phone: [phone] Fax: [phone]   Has the prescription been filled recently? No  Is the patient out of the medication? Yes  Has the patient been seen for an appointment in the last year OR does the patient have an upcoming appointment? Yes  Can we respond through MyChart? No  Agent: Please be advised that Rx refills may take up to 3 business days. We ask that you follow-up with your pharmacy.

## 2023-10-29 MED ORDER — LEVOTHYROXINE SODIUM 50 MCG PO TABS: ORAL_TABLET | ORAL | 2 refills | Status: AC

## 2023-10-29 MED ORDER — POTASSIUM CHLORIDE CRYS ER 20 MEQ PO TBCR: 20.0000 meq | EXTENDED_RELEASE_TABLET | Freq: Every day | ORAL | 1 refills | Status: DC

## 2024-01-17 ENCOUNTER — Other Ambulatory Visit: Payer: Self-pay | Admitting: Family Medicine

## 2024-01-17 DIAGNOSIS — E876 Hypokalemia: Secondary | ICD-10-CM

## 2024-01-17 DIAGNOSIS — E785 Hyperlipidemia, unspecified: Secondary | ICD-10-CM

## 2024-03-09 ENCOUNTER — Telehealth: Payer: Self-pay | Admitting: *Deleted

## 2024-03-09 ENCOUNTER — Other Ambulatory Visit: Payer: Self-pay | Admitting: Family Medicine

## 2024-03-09 DIAGNOSIS — Z1231 Encounter for screening mammogram for malignant neoplasm of breast: Secondary | ICD-10-CM

## 2024-03-09 NOTE — Telephone Encounter (Signed)
 Copied from CRM [id number]. Topic: Appointments - Scheduling Inquiry for Clinic >> Mar 09, 2024  1:27 PM [place] M wrote: Reason for CRM: Unable to schedule physical- it keeps making it as an office visit-Please contact patient to schedule

## 2024-03-13 NOTE — Telephone Encounter (Signed)
 Pt has medicare--physical isn't covered.  I made patient an appointment for a yearly f/u--she will come fasting.

## 2024-03-17 ENCOUNTER — Ambulatory Visit
Admission: RE | Admit: 2024-03-17 | Discharge: 2024-03-17 | Disposition: A | Discharge status: Home / Self Care | Source: Ambulatory Visit | Attending: Family Medicine | Admitting: Family Medicine

## 2024-03-17 DIAGNOSIS — Z1231 Encounter for screening mammogram for malignant neoplasm of breast: Secondary | ICD-10-CM | POA: Diagnosis not present

## 2024-04-21 DIAGNOSIS — H353122 Nonexudative age-related macular degeneration, left eye, intermediate dry stage: Secondary | ICD-10-CM | POA: Diagnosis not present

## 2024-04-21 DIAGNOSIS — H52203 Unspecified astigmatism, bilateral: Secondary | ICD-10-CM | POA: Diagnosis not present

## 2024-04-21 DIAGNOSIS — E119 Type 2 diabetes mellitus without complications: Secondary | ICD-10-CM | POA: Diagnosis not present

## 2024-04-21 DIAGNOSIS — H35373 Puckering of macula, bilateral: Secondary | ICD-10-CM | POA: Diagnosis not present

## 2024-04-21 DIAGNOSIS — H43813 Vitreous degeneration, bilateral: Secondary | ICD-10-CM | POA: Diagnosis not present

## 2024-04-21 DIAGNOSIS — H524 Presbyopia: Secondary | ICD-10-CM | POA: Diagnosis not present

## 2024-04-21 LAB — OPHTHALMOLOGY REPORT-SCANNED

## 2024-04-25 ENCOUNTER — Other Ambulatory Visit: Payer: Self-pay | Admitting: Family Medicine

## 2024-04-25 DIAGNOSIS — I10 Essential (primary) hypertension: Secondary | ICD-10-CM

## 2024-05-08 ENCOUNTER — Ambulatory Visit: Admitting: Family Medicine

## 2024-05-10 ENCOUNTER — Encounter: Payer: Self-pay | Admitting: Family Medicine

## 2024-05-10 ENCOUNTER — Ambulatory Visit (INDEPENDENT_AMBULATORY_CARE_PROVIDER_SITE_OTHER): Admitting: Family Medicine

## 2024-05-10 VITALS — BP 130/80 | HR 86 | Temp 97.9°F | Resp 16 | Ht 62.5 in | Wt 207.4 lb

## 2024-05-10 DIAGNOSIS — N1831 Chronic kidney disease, stage 3a: Secondary | ICD-10-CM | POA: Diagnosis not present

## 2024-05-10 DIAGNOSIS — Z23 Encounter for immunization: Secondary | ICD-10-CM

## 2024-05-10 DIAGNOSIS — Z808 Family history of malignant neoplasm of other organs or systems: Secondary | ICD-10-CM

## 2024-05-10 DIAGNOSIS — R7303 Prediabetes: Secondary | ICD-10-CM

## 2024-05-10 DIAGNOSIS — I10 Essential (primary) hypertension: Secondary | ICD-10-CM

## 2024-05-10 DIAGNOSIS — M109 Gout, unspecified: Secondary | ICD-10-CM

## 2024-05-10 DIAGNOSIS — E039 Hypothyroidism, unspecified: Secondary | ICD-10-CM

## 2024-05-10 DIAGNOSIS — E785 Hyperlipidemia, unspecified: Secondary | ICD-10-CM | POA: Diagnosis not present

## 2024-05-10 DIAGNOSIS — I4891 Unspecified atrial fibrillation: Secondary | ICD-10-CM

## 2024-05-10 DIAGNOSIS — Z0000 Encounter for general adult medical examination without abnormal findings: Secondary | ICD-10-CM

## 2024-05-10 DIAGNOSIS — L989 Disorder of the skin and subcutaneous tissue, unspecified: Secondary | ICD-10-CM | POA: Diagnosis not present

## 2024-05-10 LAB — URIC ACID: Uric Acid, Serum: 5.5 mg/dL (ref 2.4–7.0)

## 2024-05-10 LAB — HEPATIC FUNCTION PANEL
ALT: 13 U/L (ref 0–35)
AST: 18 U/L (ref 0–37)
Albumin: 4.4 g/dL (ref 3.5–5.2)
Alkaline Phosphatase: 66 U/L (ref 39–117)
Bilirubin, Direct: 0.1 mg/dL (ref 0.0–0.3)
Total Bilirubin: 0.7 mg/dL (ref 0.2–1.2)
Total Protein: 7.5 g/dL (ref 6.0–8.3)

## 2024-05-10 LAB — LIPID PANEL
Cholesterol: 171 mg/dL (ref 0–200)
HDL: 67.2 mg/dL (ref >39.00)
LDL Cholesterol: 86 mg/dL (ref 0–99)
NonHDL: 103.79
Total CHOL/HDL Ratio: 3
Triglycerides: 90 mg/dL (ref 0.0–149.0)
VLDL: 18 mg/dL (ref 0.0–40.0)

## 2024-05-10 LAB — HEMOGLOBIN A1C: Hgb A1c MFr Bld: 6.2 % (ref 4.6–6.5)

## 2024-05-10 NOTE — Progress Notes (Signed)
 Chief Complaint  Patient presents with   Medical Management of Chronic Issues   Discussed the use of AI scribe software for clinical note transcription with the patient, who gave verbal consent to proceed.  History of Present Illness [name] is an 89 year old female who presents for an annual Medicare wellness visit.  She has not been hospitalized or visited the ER since her last visit in October 2023. She remains independent, using a cane for mobility, and is able to manage her finances and daily activities, including driving and shopping. She exercises regularly with a trainer once a week and primarily consumes takeout meals, which she reheats at home. Her diet includes breakfast items like bacon and eggs, pasta dishes, and quiche. She eats a banana with peanut butter for breakfast but does not cook her own meals. She lives alone and has no family nearby, though her son from [place]  visits weekly. She has experienced the loss of two children this year, one from breast cancer and another from melanoma, which has raised her concern about her own skin lesions.  She has a history of prediabetes with a previous A1c of 6.2. Her current medications include allopurinol  100 mg daily for gout, amlodipine  5 mg daily for hypertension, furosemide  40 mg daily, potassium chloride  20 mg daily, levothyroxine  50 mcg daily for thyroid , and simvastatin  20 mg daily for cholesterol. She has not had a gout attack recently. No chest pain, difficulty breathing, or palpitations. She does not currently see a cardiologist for her atrial fibrillation.  She has crusty skin lesions on her left jawline and cheek, which do not itch or hurt. She is concerned due to her family history of melanoma.  She is having difficulty trimming her toenails and is seeking a podiatrist recommendation. She also needs assistance with trash collection due to balance issues when using a cane.  She has received her mammogram in  September 2025 and a bone density test in 2018, both of which were normal. She has not received the shingles vaccine recently and is interested in getting the flu shot.   *** lives with ***. Independent ADL's and IADL's. *** falls in the past year and denies depression symptoms.  Functional Status Survey: Is the patient deaf or have difficulty hearing?: No Does the patient have difficulty seeing, even when wearing glasses/contacts?: No Does the patient have difficulty concentrating, remembering, or making decisions?: No Does the patient have difficulty walking or climbing stairs?: Yes Does the patient have difficulty dressing or bathing?: No Does the patient have difficulty doing errands alone such as visiting a doctor's office or shopping?: No     05/10/2024    1:41 PM 04/29/2023   11:34 AM 04/29/2022   10:58 AM 04/27/2022    1:03 PM 04/23/2021   11:02 AM  Fall Risk   Falls in the past year? 0 1 0 0 0  Number falls in past yr: 0 0 0 0 0  Injury with Fall? 0 0 0 0 0  Risk for fall due to : Impaired balance/gait No Fall Risks Other (Comment) Medication side effect Impaired balance/gait  Follow up Education provided;Falls evaluation completed Falls evaluation completed Falls evaluation completed  Falls prevention discussed;Falls evaluation completed;Education provided  Education provided      Data saved with a previous flowsheet row definition   Providers *** sees regularly:  Eye care provider: ***     05/10/2024   10:57 AM  Depression screen PHQ 2/9  Decreased  Interest 3  Down, Depressed, Hopeless 0  PHQ - 2 Score 3  Altered sleeping 0  Tired, decreased energy 1  Change in appetite 0  Feeling bad or failure about yourself  0  Trouble concentrating 0  Moving slowly or fidgety/restless 0  Suicidal thoughts 0  PHQ-9 Score 4  Difficult doing work/chores Not difficult at all    Mini-Cog - 05/10/24 1110     Normal clock drawing test? yes    How many words correct? 3          Vision Screening   Right eye Left eye Both eyes  Without correction     With correction 20 40 20 40 20 40   Hypertension:  Medications:*** BP readings at home:*** Side effects:***  Negative for unusual or severe headache, visual changes, exertional chest pain, dyspnea,  focal weakness, or edema.  Lab Results  Component Value Date   CREATININE 1.2 (A) 05/21/2022   BUN 17 05/21/2022   NA 139 05/21/2022   K 3.9 05/21/2022   CL 98 (A) 05/21/2022   CO2 25 (A) 05/21/2022    Elevated HgA1C , no hx of diabetes.  Lab Results  Component Value Date   HGBA1C 6.2 05/05/2023   Lab Results  Component Value Date   MICROALBUR 0.7 11/25/2006   Lab Results  Component Value Date   LABURIC 9.3 (H) 04/29/2022    Hyperlipidemia: Currently on *** Following a low fat diet: ***. Side effects from medication:*** Lab Results  Component Value Date   CHOL 174 05/05/2023   HDL 67.70 05/05/2023   LDLCALC 84 05/05/2023   LDLDIRECT 91.4 02/03/2010   TRIG 113.0 05/05/2023   CHOLHDL 3 05/05/2023     Review of Systems See other pertinent positives and negatives in HPI.  Current Outpatient Medications on File Prior to Visit  Medication Sig Dispense Refill   acetaminophen  (TYLENOL ) 650 MG CR tablet Take 650 mg by mouth every 8 (eight) hours as needed for pain (pain).      allopurinol  (ZYLOPRIM ) 100 MG tablet TAKE 1 TABLET(100 MG) BY MOUTH DAILY 90 tablet 3   amLODipine  (NORVASC ) 5 MG tablet TAKE 1 TABLET(5 MG) BY MOUTH DAILY 90 tablet 2   aspirin  81 MG EC tablet Take 81 mg by mouth daily.       cetirizine (ZYRTEC) 10 MG tablet Take 10 mg by mouth daily.       colchicine  0.6 MG tablet Take 1 tablet by mouth twice daily for the first 7-10 days to prevent gout attack due to allopurinol  dosage change. 20 tablet 0   furosemide  (LASIX ) 40 MG tablet TAKE 1 TABLET(40 MG) BY MOUTH DAILY 90 tablet 3   levothyroxine  (SYNTHROID ) 50 MCG tablet TAKE 1 TABLET BY MOUTH ONCE DAILY, ALTERNATING  WITH 1/2 TABLET DAILY 70 tablet 2   potassium chloride  SA (KLOR-CON  M) 20 MEQ tablet TAKE 1 TABLET(20 MEQ) BY MOUTH DAILY 90 tablet 1   simvastatin  (ZOCOR ) 20 MG tablet TAKE 1 TABLET(20 MG) BY MOUTH AT BEDTIME 90 tablet 3   Vitamin D, Ergocalciferol, (DRISDOL) 1.25 MG (50000 UNIT) CAPS capsule Take 50,000 Units by mouth once a week.     ciprofloxacin  (CILOXAN ) 0.3 % ophthalmic solution Place 1 drop into the right eye every 4 (four) hours while awake. (Patient not taking: Reported on 05/10/2024) 5 mL 0   No current facility-administered medications on file prior to visit.    Past Medical History:  Diagnosis Date   Allergy    Arthritis  Broken foot    left   Cancer (HCC)    ovarian   CKD (chronic kidney disease), stage III (HCC)    Hyperlipidemia    Hypertension    Obesity    Thyroid  disease     Allergies  Allergen Reactions   Drug Ingredient [Sulfamethoxazole -Trimethoprim ]     Lips edema,dizziness,and shaky sensation.   Shellfish Allergy     Social History   Socioeconomic History   Marital status: Married    Spouse name: Not on file   Number of children: Not on file   Years of education: Not on file   Highest education level: Not on file  Occupational History   Not on file  Tobacco Use   Smoking status: Never   Smokeless tobacco: Never  Vaping Use   Vaping status: Never Used  Substance and Sexual Activity   Alcohol use: No   Drug use: No   Sexual activity: Not Currently  Other Topics Concern   Not on file  Social History Narrative   Not on file   Social Drivers of Health   Financial Resource Strain: Low Risk  (04/29/2023)   Overall Financial Resource Strain (CARDIA)    Difficulty of Paying Living Expenses: Not hard at all  Food Insecurity: No Food Insecurity (09/07/2022)   Hunger Vital Sign    Worried About Running Out of Food in the Last Year: Never true    Ran Out of Food in the Last Year: Never true  Transportation Needs: No Transportation Needs  (09/07/2022)   PRAPARE - Administrator, Civil Service (Medical): No    Lack of Transportation (Non-Medical): No  Physical Activity: Insufficiently Active (04/29/2023)   Exercise Vital Sign    Days of Exercise per Week: 1 day    Minutes of Exercise per Session: 40 min  Stress: No Stress Concern Present (04/29/2023)   [employer] of Occupational Health - Occupational Stress Questionnaire    Feeling of Stress : Not at all  Social Connections: Moderately Isolated (04/29/2023)   Social Connection and Isolation Panel    Frequency of Communication with Friends and Family: More than three times a week    Frequency of Social Gatherings with Friends and Family: Once a week    Attends Religious Services: More than 4 times per year    Active Member of [employer] or Organizations: No    Attends Banker Meetings: Never    Marital Status: Widowed    Today's Vitals   05/10/24 1059  BP: 130/80  Pulse: 86  Resp: 16  Temp: 97.9 F (36.6 C)  SpO2: 97%  Weight: 207 lb 6.4 oz (94.1 kg)  Height: 5' 2.5 (1.588 m)    Body mass index is 37.33 kg/m.  Physical Exam  ASSESSMENT AND PLAN:  [place] was seen today for medical management of chronic issues.  Diagnoses and all orders for this visit:  Medicare annual wellness visit, subsequent  Prediabetes -     Hemoglobin A1c; Future -     Hemoglobin A1c  Hyperlipidemia, unspecified hyperlipidemia type -     Hepatic function panel; Future -     Lipid panel; Future -     Lipid panel -     Hepatic function panel  Hypothyroidism, unspecified type -     TSH; Future -     T4, free; Future -     T4, free -     TSH  Skin lesions -     Ambulatory  referral to Dermatology  Family history of malignant melanoma of skin -     Ambulatory referral to Dermatology  Gouty arthropathy -     Uric acid; Future -     Uric acid  Need for immunization against influenza -     Flu vaccine HIGH DOSE PF(Fluzone   Trivalent)    Orders Placed This Encounter  Procedures   Flu vaccine HIGH DOSE PF(Fluzone  Trivalent)   Hepatic function panel   Hemoglobin A1c   Lipid panel   TSH   T4, free   Uric acid   Ambulatory referral to Dermatology    No problem-specific Assessment & Plan notes found for this encounter.   Return in about 1 year (around 05/10/2025) for chronic problems.   Dereon Corkery, MD [facility]. [place] office.

## 2024-05-10 NOTE — Patient Instructions (Signed)
 [name],  Thank you for taking the time for your Medicare Wellness Visit. I appreciate your continued commitment to your health goals. Please review the care plan we discussed, and feel free to reach out if I can assist you further.  Please note that Annual Wellness Visits do not include a physical exam. Some assessments may be limited, especially if the visit was conducted virtually. If needed, we may recommend an in-person follow-up with your provider.  Ongoing Care I will continue seeing you annually, before if needed.  Referrals If a referral was made during today's visit and you haven't received any updates within two weeks, please contact the referred provider directly to check on the status.  Recommended Screenings:  Health Maintenance  Topic Date Due   Zoster (Shingles) Vaccine (1 of 2) 10/02/1985   Flu Shot  02/04/2024   COVID-19 Vaccine (5 - 2025-26 season) 03/06/2024   Medicare Annual Wellness Visit  04/28/2024   DTaP/Tdap/Td vaccine (3 - Td or Tdap) 03/03/2027   Pneumococcal Vaccine for age over 71  Completed   DEXA scan (bone density measurement)  Completed   Meningitis B Vaccine  Aged Out       04/27/2022    1:03 PM  Advanced Directives  Does Patient Have a Medical Advance Directive? Yes  Type of Estate Agent of [place];Living will  Copy of Healthcare Power of Attorney in Chart? No - copy requested    Vision: Annual vision screenings are recommended for early detection of glaucoma, cataracts, and diabetic retinopathy. These exams can also reveal signs of chronic conditions such as diabetes and high blood pressure.  Dental: Annual dental screenings help detect early signs of oral cancer, gum disease, and other conditions linked to overall health, including heart disease and diabetes.  Please see the attached documents for additional preventive care recommendations.   A few things to remember from today's visit:  Medicare annual  wellness visit, subsequent  Prediabetes - Plan: Hemoglobin A1c  Hyperlipidemia, unspecified hyperlipidemia type - Plan: Hepatic function panel, Lipid panel  Hypothyroidism, unspecified type - Plan: TSH, T4, free  Skin lesions - Plan: Ambulatory referral to Dermatology  Family history of malignant melanoma of skin - Plan: Ambulatory referral to Dermatology  Gouty arthropathy - Plan: Uric acid  If you need refills for medications you take chronically, please call your pharmacy. Do not use My Chart to request refills or for acute issues that need immediate attention. If you send a my chart message, it may take a few days to be addressed, specially if I am not in the office.  Please be sure medication list is accurate. If a new problem present, please set up appointment sooner than planned today.

## 2024-05-11 ENCOUNTER — Telehealth: Payer: Self-pay | Admitting: *Deleted

## 2024-05-11 ENCOUNTER — Ambulatory Visit: Payer: Self-pay | Admitting: Family Medicine

## 2024-05-11 LAB — TSH: TSH: 2.9 u[IU]/mL (ref 0.35–5.50)

## 2024-05-11 LAB — T4, FREE: Free T4: 0.86 ng/dL (ref 0.60–1.60)

## 2024-05-11 NOTE — Assessment & Plan Note (Signed)
 Currently on simvastatin  20 mg daily and low-fat diet. Further recommendation will be given according to lipid panel result.

## 2024-05-11 NOTE — Assessment & Plan Note (Signed)
 Encouraged consistency with a healthy lifestyle. Last hemoglobin A1c 6.2 in 04/2023. Further recommendation will be given according to hemoglobin A1c result.

## 2024-05-11 NOTE — Assessment & Plan Note (Signed)
 Rate and rhythm controlled. Hx of episode of atrial fib during hospitalization in 07/2014 as well as episodes of sinus pauses, the latter one was attributed to BB she received IV to treat atrial fib. Asymptomatic. She is on Aspirin  81 mg daily.

## 2024-05-11 NOTE — Telephone Encounter (Signed)
 Copied from CRM [id number]. Topic: Clinical - Lab/Test Results >> May 11, 2024  2:35 PM Fonda T wrote: Reason for CRM: Patient is returning call to office to speak to [facility] regarding lab test results.   Patient is requesting a return call to discuss results further, can be reached at [phone].

## 2024-05-11 NOTE — Assessment & Plan Note (Signed)
 With associated comorbidities: Hyperlipidemia, hypertension, atrial fibrillation, generalized OA, and CKD. Weight has been stable. Encourage consistency with following a healthful diet with regular physical activity as tolerated.

## 2024-05-11 NOTE — Telephone Encounter (Signed)
Spoke to the patient and reviewed lab results.

## 2024-05-11 NOTE — Assessment & Plan Note (Signed)
 Continue Allopurinol  100 mg bid and low purine diet. She has not had a gout attack in years. Further recommendation will be given according to uric acid result.

## 2024-05-11 NOTE — Assessment & Plan Note (Signed)
 Follows with nephrologist 1-2 times per week year.

## 2024-05-11 NOTE — Assessment & Plan Note (Signed)
 Problem has been adequately controlled. Continue levothyroxine  50 mcg daily. Further recommendation will be given according to TSI result.

## 2024-05-11 NOTE — Assessment & Plan Note (Signed)
 BP adequately controlled. Continue amlodipine  5 mg daily and low salt diet. Continue monitoring BP regularly. Eye exam is current. She also follows with nephrologist.

## 2024-05-25 DIAGNOSIS — D631 Anemia in chronic kidney disease: Secondary | ICD-10-CM | POA: Diagnosis not present

## 2024-05-25 DIAGNOSIS — M109 Gout, unspecified: Secondary | ICD-10-CM | POA: Diagnosis not present

## 2024-05-25 DIAGNOSIS — N1831 Chronic kidney disease, stage 3a: Secondary | ICD-10-CM | POA: Diagnosis not present

## 2024-05-25 DIAGNOSIS — N189 Chronic kidney disease, unspecified: Secondary | ICD-10-CM | POA: Diagnosis not present

## 2024-05-25 DIAGNOSIS — N2581 Secondary hyperparathyroidism of renal origin: Secondary | ICD-10-CM | POA: Diagnosis not present

## 2024-05-26 LAB — LAB REPORT - SCANNED
Albumin, Urine POC: 68.6
Albumin/Creatinine Ratio, Urine, POC: 285
Creatinine, POC: 24.1 mg/dL
EGFR: 48

## 2024-06-03 ENCOUNTER — Other Ambulatory Visit: Payer: Self-pay | Admitting: Family Medicine

## 2024-08-09 ENCOUNTER — Other Ambulatory Visit: Payer: Self-pay | Admitting: Family Medicine

## 2024-08-09 DIAGNOSIS — E876 Hypokalemia: Secondary | ICD-10-CM
